# Patient Record
Sex: Female | Born: 1980
Health system: Southern US, Community
[De-identification: ages and names within clinical notes are randomized; demographics above are authoritative.]

## PROBLEM LIST (undated history)

## (undated) DIAGNOSIS — Z531 Procedure and treatment not carried out because of patient's decision for reasons of belief and group pressure: Secondary | ICD-10-CM

## (undated) DIAGNOSIS — G894 Chronic pain syndrome: Secondary | ICD-10-CM

## (undated) DIAGNOSIS — M797 Fibromyalgia: Secondary | ICD-10-CM

## (undated) DIAGNOSIS — C801 Malignant (primary) neoplasm, unspecified: Secondary | ICD-10-CM

## (undated) DIAGNOSIS — D6800 Von Willebrand disease, unspecified: Secondary | ICD-10-CM

## (undated) DIAGNOSIS — C50919 Malignant neoplasm of unspecified site of unspecified female breast: Secondary | ICD-10-CM

## (undated) DIAGNOSIS — C412 Malignant neoplasm of vertebral column: Secondary | ICD-10-CM

## (undated) DIAGNOSIS — M549 Dorsalgia, unspecified: Secondary | ICD-10-CM

## (undated) DIAGNOSIS — F32A Depression, unspecified: Secondary | ICD-10-CM

## (undated) DIAGNOSIS — T8859XA Other complications of anesthesia, initial encounter: Secondary | ICD-10-CM

## (undated) DIAGNOSIS — Z973 Presence of spectacles and contact lenses: Secondary | ICD-10-CM

## (undated) DIAGNOSIS — F329 Major depressive disorder, single episode, unspecified: Secondary | ICD-10-CM

## (undated) DIAGNOSIS — D649 Anemia, unspecified: Secondary | ICD-10-CM

## (undated) DIAGNOSIS — Z853 Personal history of malignant neoplasm of breast: Secondary | ICD-10-CM

## (undated) DIAGNOSIS — N83209 Unspecified ovarian cyst, unspecified side: Secondary | ICD-10-CM

## (undated) DIAGNOSIS — M7918 Myalgia, other site: Secondary | ICD-10-CM

## (undated) DIAGNOSIS — R102 Pelvic and perineal pain: Secondary | ICD-10-CM

## (undated) DIAGNOSIS — IMO0001 Reserved for inherently not codable concepts without codable children: Secondary | ICD-10-CM

## (undated) DIAGNOSIS — Z8489 Family history of other specified conditions: Secondary | ICD-10-CM

## (undated) HISTORY — PX: TUBAL LIGATION: SHX77

## (undated) HISTORY — PX: OVARIAN CYST REMOVAL: SHX89

## (undated) HISTORY — PX: ABDOMINAL HYSTERECTOMY: SHX81

## (undated) HISTORY — DX: Malignant neoplasm of unspecified site of unspecified female breast: C50.919

---

## 2008-06-26 ENCOUNTER — Ambulatory Visit: Payer: Self-pay | Admitting: Family Medicine

## 2008-06-26 DIAGNOSIS — M12249 Villonodular synovitis (pigmented), unspecified hand: Secondary | ICD-10-CM | POA: Insufficient documentation

## 2008-06-26 DIAGNOSIS — F329 Major depressive disorder, single episode, unspecified: Secondary | ICD-10-CM

## 2008-06-26 DIAGNOSIS — J309 Allergic rhinitis, unspecified: Secondary | ICD-10-CM | POA: Insufficient documentation

## 2008-06-26 DIAGNOSIS — F3289 Other specified depressive episodes: Secondary | ICD-10-CM | POA: Insufficient documentation

## 2008-06-26 DIAGNOSIS — M25539 Pain in unspecified wrist: Secondary | ICD-10-CM | POA: Insufficient documentation

## 2008-06-26 DIAGNOSIS — Z853 Personal history of malignant neoplasm of breast: Secondary | ICD-10-CM | POA: Insufficient documentation

## 2008-06-30 ENCOUNTER — Ambulatory Visit: Payer: Self-pay | Admitting: Family Medicine

## 2008-06-30 DIAGNOSIS — K219 Gastro-esophageal reflux disease without esophagitis: Secondary | ICD-10-CM | POA: Insufficient documentation

## 2008-06-30 DIAGNOSIS — R109 Unspecified abdominal pain: Secondary | ICD-10-CM | POA: Insufficient documentation

## 2008-07-03 ENCOUNTER — Ambulatory Visit: Payer: Self-pay | Admitting: Family Medicine

## 2008-07-03 DIAGNOSIS — R5381 Other malaise: Secondary | ICD-10-CM | POA: Insufficient documentation

## 2008-07-03 DIAGNOSIS — R5383 Other fatigue: Secondary | ICD-10-CM

## 2008-07-09 LAB — CONVERTED CEMR LAB
Basophils Absolute: 0 10*3/uL (ref 0.0–0.1)
CO2: 26 meq/L (ref 19–32)
Chloride: 102 meq/L (ref 96–112)
Cholesterol: 177 mg/dL (ref 0–200)
H Pylori IgG: NEGATIVE
LDL Cholesterol: 101 mg/dL — ABNORMAL HIGH (ref 0–99)
Lymphocytes Relative: 37.5 % (ref 12.0–46.0)
MCHC: 33.4 g/dL (ref 30.0–36.0)
Neutrophils Relative %: 51.6 % (ref 43.0–77.0)
Potassium: 3.5 meq/L (ref 3.5–5.1)
RDW: 12.6 % (ref 11.5–14.6)
Sodium: 137 meq/L (ref 135–145)
TSH: 0.36 microintl units/mL (ref 0.35–5.50)
Total CHOL/HDL Ratio: 2.9
Triglycerides: 73 mg/dL (ref 0–149)
VLDL: 15 mg/dL (ref 0–40)

## 2008-07-10 ENCOUNTER — Encounter (INDEPENDENT_AMBULATORY_CARE_PROVIDER_SITE_OTHER): Payer: Self-pay | Admitting: *Deleted

## 2008-10-20 ENCOUNTER — Ambulatory Visit: Payer: Self-pay | Admitting: Family Medicine

## 2008-10-20 DIAGNOSIS — K5289 Other specified noninfective gastroenteritis and colitis: Secondary | ICD-10-CM | POA: Insufficient documentation

## 2009-01-20 ENCOUNTER — Emergency Department (HOSPITAL_COMMUNITY): Admission: EM | Admit: 2009-01-20 | Discharge: 2009-01-21 | Payer: Self-pay | Admitting: Emergency Medicine

## 2009-04-09 ENCOUNTER — Other Ambulatory Visit: Payer: Self-pay | Admitting: Emergency Medicine

## 2009-04-09 ENCOUNTER — Inpatient Hospital Stay (HOSPITAL_COMMUNITY): Admission: AD | Admit: 2009-04-09 | Discharge: 2009-04-09 | Payer: Self-pay | Admitting: Obstetrics & Gynecology

## 2009-10-10 ENCOUNTER — Ambulatory Visit: Payer: Self-pay | Admitting: Family Medicine

## 2009-10-10 DIAGNOSIS — J069 Acute upper respiratory infection, unspecified: Secondary | ICD-10-CM | POA: Insufficient documentation

## 2009-10-10 DIAGNOSIS — G4700 Insomnia, unspecified: Secondary | ICD-10-CM | POA: Insufficient documentation

## 2010-06-11 NOTE — Assessment & Plan Note (Signed)
Summary: 10:15  FEVER,THROWING UP,COUGH/CLE   Vital Signs:  Patient profile:   30 year old female Height:      63 inches Weight:      138.0 pounds BMI:     24.53 Temp:     98.2 degrees F oral Pulse rate:   80 / minute Pulse rhythm:   regular BP sitting:   98 / 60  (left arm) Cuff size:   regular  Vitals Entered By: Benny Lennert CMA Duncan Dull) (October 10, 2009 10:25 AM)  History of Present Illness: Chief complaint cough,runnynose,nausea,diahrrea with blood,chills, and inablity to sleep  Had some severe anemia, severe pain in her abdomen. Had a placenta abrupition.   Hgb got down to 4 while in the hospital.  On bed rest since January.  Jessica Horn -- Dr. Dorothey Baseman at [place].  c/s on Saturday. Blood count came up around the time of delivery.   Started to have some sneezing and was vomitting and having chills.  Nausea, dizziness. Feels like she needs to rest but she cannot rest.   Scared to take some different things.   Had a little diarrhea with some blood.   No fever. Chills.   Allergies (verified): No Known Drug Allergies  Past History:  Past medical, surgical, family and social histories (including risk factors) reviewed, and no changes noted (except as noted below).  Past Medical History: Allergic rhinitis Depression Migraines Breast CA, dx 20, Chemo, 1 rad tx (????) IBS GERD Complex OB, treated at [facility] high risk OB - intractable pain, early dilation, bedrest, placental abruption  NO BLOOD OR BLOOD PRODUCTS  Past Surgical History: c/s, 10/08/2009  Family History: Reviewed history from 06/26/2008 and no changes required. Family History Hypertension (grandparent) Negative for Rheumatological disease  Social History: Reviewed history from 06/30/2008 and no changes required. Occupation: Audiological scientist Witness Single - engaged Never Smoked Alcohol use-yes Drug use-no Regular exercise-no  Review of Systems       as above  Physical  Exam  General:  alert.  pale. mildly ill in appearance. moves slowly. Head:  Normocephalic and atraumatic without obvious abnormalities. No apparent alopecia or balding. Ears:  External ear exam shows no significant lesions or deformities.  Otoscopic examination reveals clear canals, tympanic membranes are intact bilaterally without bulging, retraction, inflammation or discharge. Hearing is grossly normal bilaterally. Nose:  External nasal examination shows no deformity or inflammation. Nasal mucosa are pink and moist without lesions or exudates. Mouth:  Oral mucosa and oropharynx without lesions or exudates.  Teeth in good repair. Neck:  No deformities, masses, or tenderness noted. Lungs:  Normal respiratory effort, chest expands symmetrically. Lungs are clear to auscultation, no crackles or wheezes. Heart:  Normal rate and regular rhythm. S1 and S2 normal without gallop, murmur, click, rub or other extra sounds. Abdomen:  incision c/d/i no redness surrounding Neurologic:  alert & oriented X3.   Cervical Nodes:  No lymphadenopathy noted Psych:  Cognition and judgment appear intact. Alert and cooperative with normal attention span and concentration. No apparent delusions, illusions, hallucinations   Impression & Recommendations:  Problem # 1:  URI (ICD-465.9) Assessment New Complex case given s/p c/s 3 days ago, intractable pain, pain with coughing.  runny nose and cough, c/w URI - but complex in this case d/w Dr. Alphonsus Sias and reviewed all BF meds to ensure OK for newborn  Problem # 2:  COUGH (ICD-786.2) Assessment: New  Problem # 3:  INSOMNIA (ICD-780.52) Assessment: New  Her updated medication list for  this problem includes:    Zolpidem Tartrate 10 Mg Tabs (Zolpidem tartrate) .Marland Kitchen... 1/2 tab by mouth at bedtime as needed insomnia  Complete Medication List: 1)  Zolpidem Tartrate 10 Mg Tabs (Zolpidem tartrate) .... 1/2 tab by mouth at bedtime as needed insomnia  Patient  Instructions: 1)  Plain Mucinex for cough twice a day 2)  Tylenol is OK 3)  Nasal saline spray for nose 4)  Chloraseptic if throat hurts 5)  Plenty of sleep Prescriptions: ZOLPIDEM TARTRATE 10 MG TABS (ZOLPIDEM TARTRATE) 1/2 tab by mouth at bedtime as needed insomnia  #15 x 0   Entered and Authorized by:   Hannah Beat MD   Signed by:   Hannah Beat MD on 10/10/2009   Method used:   Print then Give to Patient   RxID:   [id number]   Prior Medications (reviewed today): None Current Allergies (reviewed today): No known allergies

## 2010-08-14 LAB — DIFFERENTIAL
Basophils Absolute: 0.1 10*3/uL (ref 0.0–0.1)
Eosinophils Relative: 2 % (ref 0–5)
Lymphocytes Relative: 30 % (ref 12–46)
Monocytes Relative: 6 % (ref 3–12)
Neutro Abs: 3.9 10*3/uL (ref 1.7–7.7)

## 2010-08-14 LAB — CBC
Hemoglobin: 11.5 g/dL — ABNORMAL LOW (ref 12.0–15.0)
RBC: 4.08 MIL/uL (ref 3.87–5.11)
RDW: 13.2 % (ref 11.5–15.5)
WBC: 6.5 10*3/uL (ref 4.0–10.5)

## 2010-08-14 LAB — URINALYSIS, ROUTINE W REFLEX MICROSCOPIC
Glucose, UA: NEGATIVE mg/dL
Protein, ur: NEGATIVE mg/dL
Urobilinogen, UA: 0.2 mg/dL (ref 0.0–1.0)

## 2010-08-14 LAB — GC/CHLAMYDIA PROBE AMP, GENITAL: Chlamydia, DNA Probe: NEGATIVE

## 2010-08-14 LAB — URINE MICROSCOPIC-ADD ON

## 2010-08-14 LAB — WET PREP, GENITAL

## 2010-10-28 ENCOUNTER — Telehealth: Payer: Self-pay | Admitting: *Deleted

## 2010-10-28 NOTE — Telephone Encounter (Signed)
Benadryl and cortisone cream are very reasonable first steps  F/u if worsening significantly

## 2010-10-28 NOTE — Telephone Encounter (Signed)
Noted  

## 2010-10-28 NOTE — Telephone Encounter (Signed)
[place] and she says that now she is broken out in hives on her hands, butt, legs and her feet are swollen. She says that hive are really itchy, but are painful as well. She is taking tylenol and has used cortisone cream. She is asking if it is okay for her to take benadryl or what other recommendations you would have. Please advise.

## 2010-10-28 NOTE — Telephone Encounter (Signed)
Patients mother advised unable to call long distance

## 2010-11-10 ENCOUNTER — Emergency Department (HOSPITAL_COMMUNITY)
Admission: EM | Admit: 2010-11-10 | Discharge: 2010-11-10 | Disposition: A | Discharge status: Home / Self Care | Payer: Medicaid Other | Attending: Emergency Medicine | Admitting: Emergency Medicine

## 2010-11-10 DIAGNOSIS — N949 Unspecified condition associated with female genital organs and menstrual cycle: Secondary | ICD-10-CM | POA: Insufficient documentation

## 2010-11-10 DIAGNOSIS — N898 Other specified noninflammatory disorders of vagina: Secondary | ICD-10-CM | POA: Insufficient documentation

## 2010-11-10 LAB — URINE MICROSCOPIC-ADD ON

## 2010-11-10 LAB — WET PREP, GENITAL

## 2010-11-10 LAB — POCT I-STAT, CHEM 8
Creatinine, Ser: 0.6 mg/dL (ref 0.50–1.10)
Glucose, Bld: 84 mg/dL (ref 70–99)
Hemoglobin: 13.6 g/dL (ref 12.0–15.0)
TCO2: 19 mmol/L (ref 0–100)

## 2010-11-10 LAB — URINALYSIS, ROUTINE W REFLEX MICROSCOPIC
Leukocytes, UA: NEGATIVE
Nitrite: NEGATIVE
Specific Gravity, Urine: 1.022 (ref 1.005–1.030)
Urobilinogen, UA: 0.2 mg/dL (ref 0.0–1.0)

## 2010-11-10 LAB — CBC
HCT: 36.5 % (ref 36.0–46.0)
MCH: 27.4 pg (ref 26.0–34.0)
MCHC: 33.7 g/dL (ref 30.0–36.0)
RDW: 13.7 % (ref 11.5–15.5)

## 2010-11-10 LAB — POCT PREGNANCY, URINE: Preg Test, Ur: NEGATIVE

## 2010-11-10 LAB — DIFFERENTIAL
Basophils Absolute: 0 10*3/uL (ref 0.0–0.1)
Basophils Relative: 0 % (ref 0–1)
Eosinophils Relative: 2 % (ref 0–5)
Monocytes Absolute: 0.6 10*3/uL (ref 0.1–1.0)

## 2010-11-10 LAB — SAMPLE TO BLOOD BANK

## 2010-11-11 LAB — GC/CHLAMYDIA PROBE AMP, GENITAL: Chlamydia, DNA Probe: NEGATIVE

## 2012-01-11 ENCOUNTER — Encounter (HOSPITAL_COMMUNITY): Payer: Self-pay | Admitting: Emergency Medicine

## 2012-01-11 ENCOUNTER — Emergency Department (HOSPITAL_COMMUNITY)
Admission: EM | Admit: 2012-01-11 | Discharge: 2012-01-12 | Disposition: A | Discharge status: Home / Self Care | Payer: Self-pay | Attending: Emergency Medicine | Admitting: Emergency Medicine

## 2012-01-11 DIAGNOSIS — G8929 Other chronic pain: Secondary | ICD-10-CM | POA: Insufficient documentation

## 2012-01-11 DIAGNOSIS — Z853 Personal history of malignant neoplasm of breast: Secondary | ICD-10-CM | POA: Insufficient documentation

## 2012-01-11 DIAGNOSIS — M549 Dorsalgia, unspecified: Secondary | ICD-10-CM | POA: Insufficient documentation

## 2012-01-11 DIAGNOSIS — IMO0001 Reserved for inherently not codable concepts without codable children: Secondary | ICD-10-CM | POA: Insufficient documentation

## 2012-01-11 HISTORY — DX: Dorsalgia, unspecified: M54.9

## 2012-01-11 HISTORY — DX: Malignant (primary) neoplasm, unspecified: C80.1

## 2012-01-11 HISTORY — DX: Fibromyalgia: M79.7

## 2012-01-11 LAB — CBC WITH DIFFERENTIAL/PLATELET
Basophils Absolute: 0 10*3/uL (ref 0.0–0.1)
Basophils Relative: 0 % (ref 0–1)
Eosinophils Absolute: 0.1 10*3/uL (ref 0.0–0.7)
Hemoglobin: 12.8 g/dL (ref 12.0–15.0)
MCH: 27.6 pg (ref 26.0–34.0)
MCHC: 33.6 g/dL (ref 30.0–36.0)
Neutro Abs: 5.2 10*3/uL (ref 1.7–7.7)
Neutrophils Relative %: 66 % (ref 43–77)
Platelets: 304 10*3/uL (ref 150–400)
RDW: 11.9 % (ref 11.5–15.5)

## 2012-01-11 LAB — BASIC METABOLIC PANEL
Chloride: 103 mEq/L (ref 96–112)
GFR calc Af Amer: >90 mL/min (ref >90)
GFR calc non Af Amer: >90 mL/min (ref >90)
Potassium: 3.8 mEq/L (ref 3.5–5.1)
Sodium: 138 mEq/L (ref 135–145)

## 2012-01-11 LAB — POCT PREGNANCY, URINE: Preg Test, Ur: NEGATIVE

## 2012-01-11 LAB — URINALYSIS, ROUTINE W REFLEX MICROSCOPIC
Glucose, UA: NEGATIVE mg/dL
Leukocytes, UA: NEGATIVE
Nitrite: NEGATIVE
Protein, ur: NEGATIVE mg/dL
Urobilinogen, UA: 0.2 mg/dL (ref 0.0–1.0)

## 2012-01-11 MED ORDER — OXYCODONE-ACETAMINOPHEN 5-325 MG PO TABS
2.0000 | ORAL_TABLET | Freq: Once | ORAL | Status: AC
  Administered 2012-01-11: 2 via ORAL
  Filled 2012-01-11: qty 2

## 2012-01-11 MED ORDER — KETOROLAC TROMETHAMINE 60 MG/2ML IM SOLN
60.0000 mg | Freq: Once | INTRAMUSCULAR | Status: AC
  Administered 2012-01-11: 60 mg via INTRAMUSCULAR
  Filled 2012-01-11: qty 2

## 2012-01-11 NOTE — ED Notes (Signed)
C/o generalized back pain x 5 years and LLQ pain x 1 year.  Denies nausea, vomiting, and urinary complaints.

## 2012-01-11 NOTE — ED Notes (Addendum)
Pt states that she lost insurance and cannot see anyone now for pain medication. States "My pain level is through the roof, that's why I'm here, I usually take a lot and can't get any." Pt states that she has hx of multiple cysts, but MD thought pt could have fibromyalgia.

## 2012-01-11 NOTE — ED Provider Notes (Signed)
History     CSN: [id number]  Arrival date & time 01/11/12  1943   First MD Initiated Contact with Patient 01/11/12 2232      Chief Complaint  Patient presents with  . Back Pain   HPI  History provided by the patient. Patient is a 31 year old female with prior history of fibromyalgia, back pain, IBS who presents with multiple complaints of chronic symptoms. Patient complains of diffuse back pain as well as some left lower quadrant abdominal pains. Symptoms have been worsening over the past 6 months have been present off and on for the past 5 years. Patient states she lost her Medicaid 6 months ago and has not been able to take her normal medications of oxycodone and muscle relaxers or medications for her IBS. Patient states that she never had any explanation for why she had such chronic pains in her back she states she was unable to get any workup or testing done for her insurance was lost. Patient also reports having a 60 pound weight loss over the past 2 months. She denies any other complaints at this time. Pain is worse with movements and walking. She denies any fever, chills or sweats. She denies any nausea, vomiting, diarrhea or constipation.    Past Medical History  Diagnosis Date  . Fibromyalgia   . Back pain   . Cancer     History reviewed. No pertinent past surgical history.  No family history on file.  History  Substance Use Topics  . Smoking status: Never Smoker   . Smokeless tobacco: Not on file  . Alcohol Use: Yes    OB History    Grav Para Term Preterm Abortions TAB SAB Ect Mult Living                  Review of Systems  Constitutional: Positive for unexpected weight change. Negative for fever, chills, diaphoresis and appetite change.  HENT: Negative for neck pain and neck stiffness.   Respiratory: Negative for cough and shortness of breath.   Cardiovascular: Negative for chest pain.  Gastrointestinal: Positive for abdominal pain. Negative for nausea,  vomiting, diarrhea and constipation.  Genitourinary: Negative for dysuria, frequency, hematuria, flank pain, vaginal bleeding and vaginal discharge.  Musculoskeletal: Positive for back pain. Negative for myalgias and arthralgias.  Skin: Negative for rash.  Neurological: Negative for headaches.    Allergies  Review of patient's allergies indicates no known allergies.  Home Medications  No current outpatient prescriptions on file.  BP 128/104  Pulse 98  Temp 99 F (37.2 C) (Oral)  Resp 16  SpO2 100%  LMP 12/13/2011  Physical Exam  Nursing note and vitals reviewed. Constitutional: She is oriented to person, place, and time. She appears well-developed and well-nourished. No distress.  HENT:  Head: Normocephalic.  Cardiovascular: Normal rate and regular rhythm.   Pulmonary/Chest: Effort normal and breath sounds normal.  Abdominal: Soft.  Musculoskeletal:       Thoracic back: She exhibits tenderness and bony tenderness.       Lumbar back: She exhibits tenderness and bony tenderness.       Back:       Patient complains of pain and jumps to any light palpation along the thoracic and lumbar spine. There is no gross deformities of the spinous processes. No swelling. No significant scoliosis. Patient does move normally with bending up and down in bed and sitting.  Neurological: She is alert and oriented to person, place, and time. She has normal strength.  No sensory deficit.       Normal strength and sensations bilaterally in extremities.  Skin: Skin is warm and dry. No rash noted.  Psychiatric: She has a normal mood and affect. Her behavior is normal.    ED Course  Procedures  Results for orders placed during the hospital encounter of 01/11/12  URINALYSIS, ROUTINE W REFLEX MICROSCOPIC      Component Value Range   Color, Urine YELLOW  YELLOW   APPearance CLEAR  CLEAR   Specific Gravity, Urine 1.008  1.005 - 1.030   pH 6.5  5.0 - 8.0   Glucose, UA NEGATIVE  NEGATIVE mg/dL    Hgb urine dipstick NEGATIVE  NEGATIVE   Bilirubin Urine NEGATIVE  NEGATIVE   Ketones, ur NEGATIVE  NEGATIVE mg/dL   Protein, ur NEGATIVE  NEGATIVE mg/dL   Urobilinogen, UA 0.2  0.0 - 1.0 mg/dL   Nitrite NEGATIVE  NEGATIVE   Leukocytes, UA NEGATIVE  NEGATIVE  SEDIMENTATION RATE      Component Value Range   Sed Rate 2  0 - 22 mm/hr  CBC WITH DIFFERENTIAL      Component Value Range   WBC 8.0  4.0 - 10.5 K/uL   RBC 4.63  3.87 - 5.11 MIL/uL   Hemoglobin 12.8  12.0 - 15.0 g/dL   HCT 95.6  21.3 - 08.6 %   MCV 82.3  78.0 - 100.0 fL   MCH 27.6  26.0 - 34.0 pg   MCHC 33.6  30.0 - 36.0 g/dL   RDW 57.8  46.9 - 62.9 %   Platelets 304  150 - 400 K/uL   Neutrophils Relative 66  43 - 77 %   Neutro Abs 5.2  1.7 - 7.7 K/uL   Lymphocytes Relative 29  12 - 46 %   Lymphs Abs 2.3  0.7 - 4.0 K/uL   Monocytes Relative 4  3 - 12 %   Monocytes Absolute 0.4  0.1 - 1.0 K/uL   Eosinophils Relative 1  0 - 5 %   Eosinophils Absolute 0.1  0.0 - 0.7 K/uL   Basophils Relative 0  0 - 1 %   Basophils Absolute 0.0  0.0 - 0.1 K/uL  BASIC METABOLIC PANEL      Component Value Range   Sodium 138  135 - 145 mEq/L   Potassium 3.8  3.5 - 5.1 mEq/L   Chloride 103  96 - 112 mEq/L   CO2 24  19 - 32 mEq/L   Glucose, Bld 99  70 - 99 mg/dL   BUN 9  6 - 23 mg/dL   Creatinine, Ser 5.28  0.50 - 1.10 mg/dL   Calcium 9.8  8.4 - 41.3 mg/dL   GFR calc non Af Amer >90  >90 mL/min   GFR calc Af Amer >90  >90 mL/min  POCT PREGNANCY, URINE      Component Value Range   Preg Test, Ur NEGATIVE  NEGATIVE      Dg Thoracic Spine 2 View  01/12/2012  *RADIOLOGY REPORT*  Clinical Data: Back pain, history breast cancer  THORACIC SPINE - 2 VIEW  Comparison: None  Findings: Osseous demineralization. 12 pairs of ribs. Mild broad-based dextroconvex thoracic scoliosis apex T8. Vertebral body and disc space heights maintained. No acute fracture, subluxation or bone destruction. Visualized portions of the posterior ribs appear intact.   IMPRESSION: Mild dextroconvex scoliosis. No acute abnormalities.   Original Report Authenticated By: Lollie Marrow, M.D.    Dg Lumbar Spine  Complete  01/12/2012  *RADIOLOGY REPORT*  Clinical Data: Back pain for 2 years worsening in left 6 months  LUMBAR SPINE - COMPLETE 4+ VIEW  Comparison: None  Findings: Five non-rib bearing lumbar vertebrae. Vertebral body disc space heights maintained. No acute fracture, subluxation or bone destruction. No spondylolysis. SI joints symmetric. Surgical clips in pelvis likely prior tubal ligation.  IMPRESSION: No acute osseous abnormalities.   Original Report Authenticated By: Lollie Marrow, M.D.      1. Chronic pain       MDM  10:50PM patient seen and evaluated. This does not appear in any significant discomfort or acute distress.    Patient has had prescriptions filled for oxycodone and hydrocodone several times in May of this year as well as a prescription in July under the last name Zollie Beckers at different locations in the stay including [place], [place]. Prior to this she did have regular narcotic prescriptions by Dr. Marita [place] under the last name Nyquist up until March of this year.   Angus Seller, [place] 01/12/12 [phone]

## 2012-01-12 ENCOUNTER — Emergency Department (HOSPITAL_COMMUNITY): Payer: Self-pay

## 2012-01-12 LAB — SEDIMENTATION RATE: Sed Rate: 2 mm/hr (ref 0–22)

## 2012-01-12 MED ORDER — CYCLOBENZAPRINE HCL 10 MG PO TABS: 10.0000 mg | ORAL_TABLET | Freq: Three times a day (TID) | ORAL | Status: AC | PRN

## 2012-01-12 MED ORDER — MELOXICAM 7.5 MG PO TABS: 7.5000 mg | ORAL_TABLET | Freq: Every day | ORAL | Status: DC

## 2012-01-12 NOTE — ED Notes (Signed)
Patient transported to X-ray 

## 2012-01-13 NOTE — ED Provider Notes (Signed)
Medical screening examination/treatment/procedure(s) were performed by non-physician practitioner and as supervising physician I was immediately available for consultation/collaboration.   Yazlynn Birkeland W Leotha Voeltz, MD 01/13/12 0004 

## 2012-04-19 ENCOUNTER — Encounter (HOSPITAL_COMMUNITY): Payer: Self-pay | Admitting: *Deleted

## 2012-04-19 ENCOUNTER — Emergency Department (HOSPITAL_COMMUNITY): Payer: Self-pay

## 2012-04-19 ENCOUNTER — Emergency Department (HOSPITAL_COMMUNITY)
Admission: EM | Admit: 2012-04-19 | Discharge: 2012-04-19 | Disposition: A | Discharge status: Home / Self Care | Payer: Self-pay | Attending: Emergency Medicine | Admitting: Emergency Medicine

## 2012-04-19 DIAGNOSIS — Z853 Personal history of malignant neoplasm of breast: Secondary | ICD-10-CM | POA: Insufficient documentation

## 2012-04-19 DIAGNOSIS — IMO0001 Reserved for inherently not codable concepts without codable children: Secondary | ICD-10-CM | POA: Insufficient documentation

## 2012-04-19 DIAGNOSIS — J111 Influenza due to unidentified influenza virus with other respiratory manifestations: Secondary | ICD-10-CM | POA: Insufficient documentation

## 2012-04-19 DIAGNOSIS — B349 Viral infection, unspecified: Secondary | ICD-10-CM

## 2012-04-19 DIAGNOSIS — B9789 Other viral agents as the cause of diseases classified elsewhere: Secondary | ICD-10-CM | POA: Insufficient documentation

## 2012-04-19 LAB — RAPID STREP SCREEN (MED CTR MEBANE ONLY): Streptococcus, Group A Screen (Direct): NEGATIVE

## 2012-04-19 MED ORDER — LIDOCAINE VISCOUS 2 % MT SOLN
20.0000 mL | Freq: Once | OROMUCOSAL | Status: AC
  Administered 2012-04-19: 20 mL via OROMUCOSAL
  Filled 2012-04-19: qty 15

## 2012-04-19 MED ORDER — ACETAMINOPHEN 500 MG PO CAPS: ORAL_CAPSULE | ORAL | Status: DC

## 2012-04-19 MED ORDER — LIDOCAINE VISCOUS 2 % MT SOLN: 20.0000 mL | OROMUCOSAL | Status: DC | PRN

## 2012-04-19 MED ORDER — DEXAMETHASONE SODIUM PHOSPHATE 10 MG/ML IJ SOLN: 10.0000 mg | Freq: Once | INTRAMUSCULAR | Status: DC

## 2012-04-19 MED ORDER — DEXAMETHASONE SODIUM PHOSPHATE 10 MG/ML IJ SOLN
10.0000 mg | Freq: Once | INTRAMUSCULAR | Status: AC
  Administered 2012-04-19: 10 mg via INTRAMUSCULAR
  Filled 2012-04-19: qty 1

## 2012-04-19 MED ORDER — IBUPROFEN 600 MG PO TABS: 600.0000 mg | ORAL_TABLET | Freq: Four times a day (QID) | ORAL | Status: DC | PRN

## 2012-04-19 NOTE — ED Notes (Signed)
Pt reports cough, lethargy, sore throat x 1 day.  pts daughter was diagnosed with PNA yesterday.

## 2012-04-19 NOTE — ED Provider Notes (Addendum)
History   This chart was scribed for Derwood Kaplan, MD, by Frederik Pear, ER scribe. The patient was seen in room TR07C/TR07C and the patient's care was started at 1819.    CSN: [id number]  Arrival date & time 04/19/12  1801   First MD Initiated Contact with Patient 04/19/12 1819      Chief Complaint  Patient presents with  . Sore Throat    (Consider location/radiation/quality/duration/timing/severity/associated sxs/prior treatment) HPI Comments: Jessica Horn is a 31 y.o. female who presents to the Emergency Department complaining of a constant, moderate sore throat with associated congestions, fever, generalized body aches, and a dry cough that began 3 days ago. She states that the pain from the sore throat makes it hard to swallow. She states that she has used Mucinex at home with no relief. She has no h/o of diabetes or other existing medical conditions. She reports that her daughter was diagnosed with PNA yesterday.      Past Medical History  Diagnosis Date  . Fibromyalgia   . Back pain   . Cancer     breast CA at 31 y/o    History reviewed. No pertinent past surgical history.  History reviewed. No pertinent family history.  History  Substance Use Topics  . Smoking status: Never Smoker   . Smokeless tobacco: Not on file  . Alcohol Use: Yes    OB History    Grav Para Term Preterm Abortions TAB SAB Ect Mult Living                  Review of Systems  A complete 10 system review of systems was obtained and all systems are negative except as noted in the HPI and PMH.   Allergies  Review of patient's allergies indicates no known allergies.  Home Medications   Current Outpatient Rx  Name  Route  Sig  Dispense  Refill  . MELOXICAM 7.5 MG PO TABS   Oral   Take 1 tablet (7.5 mg total) by mouth daily.   15 tablet   0     BP 111/86  Pulse 121  Temp 99.2 F (37.3 C) (Oral)  Resp 97  SpO2 97%  Physical Exam  Nursing note and vitals  reviewed. HENT:       Her bilateral nares are clear. She has no erythema. She has mild posterior pharyngeal erythema. She has no tonsillar exudates or enlargement.  Cardiovascular: Regular rhythm.  Tachycardia present.   Pulmonary/Chest: Effort normal and breath sounds normal.       Her lungs are clear to occultation bilaterally.   Abdominal: Soft. Bowel sounds are normal.  Lymphadenopathy:    She has cervical adenopathy.    ED Course  Procedures (including critical care time)  DIAGNOSTIC STUDIES: Oxygen Saturation is 97% on room air, adequate by my interpretation.    COORDINATION OF CARE:  18:30- Discussed planned course of treatment with the patient, including a dose of steroids and a chest X-ray, who is agreeable at this time.     Labs Reviewed  RAPID STREP SCREEN   No results found.   No diagnosis found.    MDM  I personally performed the services described in this documentation, which was scribed in my presence. The recorded information has been reviewed and is accurate.  Pt comes in with sore throat, sinus congestion, cough and myalgias with fever.  DDX includes: Viral syndrome Influenza Pharyngitis Sinusitis Mononucleosis  Initial impression is viral syndrome - or true influenza. Triage  ordered rapid strep - but i dont think she has strep. Will give decadron for the pharyngitis. Patient will get CXR to r/o pneumonia as that have a family member with PNA. Patient doesn't want to the rx for tamiflu when given the option after we discussed the tamiflu benefits.  No concerns for deep infection of the throat based on hx and exam showing no drooling, muffled sounds, and difficulty with breathing or controlling secretions.     Derwood Kaplan, MD 04/19/12 1857  Derwood Kaplan, MD 04/19/12 1901

## 2012-04-30 ENCOUNTER — Encounter: Payer: Self-pay | Admitting: Family Medicine

## 2012-05-26 ENCOUNTER — Ambulatory Visit: Payer: Self-pay | Admitting: Family Medicine

## 2012-06-07 ENCOUNTER — Emergency Department (HOSPITAL_COMMUNITY)
Admission: EM | Admit: 2012-06-07 | Discharge: 2012-06-08 | Disposition: A | Discharge status: Home / Self Care | Payer: BC Managed Care – PPO | Attending: Emergency Medicine | Admitting: Emergency Medicine

## 2012-06-07 ENCOUNTER — Emergency Department (HOSPITAL_COMMUNITY): Payer: BC Managed Care – PPO

## 2012-06-07 ENCOUNTER — Encounter (HOSPITAL_COMMUNITY): Payer: Self-pay | Admitting: Adult Health

## 2012-06-07 DIAGNOSIS — IMO0002 Reserved for concepts with insufficient information to code with codable children: Secondary | ICD-10-CM | POA: Insufficient documentation

## 2012-06-07 DIAGNOSIS — Y9289 Other specified places as the place of occurrence of the external cause: Secondary | ICD-10-CM | POA: Insufficient documentation

## 2012-06-07 DIAGNOSIS — S060XAA Concussion with loss of consciousness status unknown, initial encounter: Secondary | ICD-10-CM

## 2012-06-07 DIAGNOSIS — Y939 Activity, unspecified: Secondary | ICD-10-CM | POA: Insufficient documentation

## 2012-06-07 DIAGNOSIS — Z853 Personal history of malignant neoplasm of breast: Secondary | ICD-10-CM | POA: Insufficient documentation

## 2012-06-07 DIAGNOSIS — Z23 Encounter for immunization: Secondary | ICD-10-CM | POA: Insufficient documentation

## 2012-06-07 DIAGNOSIS — S060X9A Concussion with loss of consciousness of unspecified duration, initial encounter: Secondary | ICD-10-CM | POA: Insufficient documentation

## 2012-06-07 DIAGNOSIS — W1809XA Striking against other object with subsequent fall, initial encounter: Secondary | ICD-10-CM | POA: Insufficient documentation

## 2012-06-07 DIAGNOSIS — R4789 Other speech disturbances: Secondary | ICD-10-CM | POA: Insufficient documentation

## 2012-06-07 DIAGNOSIS — R55 Syncope and collapse: Secondary | ICD-10-CM | POA: Insufficient documentation

## 2012-06-07 DIAGNOSIS — S80211A Abrasion, right knee, initial encounter: Secondary | ICD-10-CM

## 2012-06-07 DIAGNOSIS — R42 Dizziness and giddiness: Secondary | ICD-10-CM | POA: Insufficient documentation

## 2012-06-07 DIAGNOSIS — W010XXA Fall on same level from slipping, tripping and stumbling without subsequent striking against object, initial encounter: Secondary | ICD-10-CM | POA: Insufficient documentation

## 2012-06-07 DIAGNOSIS — Z8739 Personal history of other diseases of the musculoskeletal system and connective tissue: Secondary | ICD-10-CM | POA: Insufficient documentation

## 2012-06-07 MED ORDER — ONDANSETRON 4 MG PO TBDP
8.0000 mg | ORAL_TABLET | Freq: Once | ORAL | Status: AC
  Administered 2012-06-07: 8 mg via ORAL
  Filled 2012-06-07 (×2): qty 2

## 2012-06-07 MED ORDER — DICLOFENAC SODIUM 1 % TD GEL: 2.0000 g | Freq: Four times a day (QID) | TRANSDERMAL | Status: DC

## 2012-06-07 MED ORDER — OXYCODONE-ACETAMINOPHEN 5-325 MG PO TABS: 2.0000 | ORAL_TABLET | ORAL | Status: DC | PRN

## 2012-06-07 MED ORDER — ONDANSETRON HCL 4 MG PO TABS: 4.0000 mg | ORAL_TABLET | Freq: Four times a day (QID) | ORAL | Status: DC | PRN

## 2012-06-07 MED ORDER — TRAMADOL HCL 50 MG PO TABS
50.0000 mg | ORAL_TABLET | Freq: Once | ORAL | Status: AC
  Administered 2012-06-07: 50 mg via ORAL
  Filled 2012-06-07: qty 1

## 2012-06-07 MED ORDER — OXYCODONE-ACETAMINOPHEN 5-325 MG PO TABS
1.0000 | ORAL_TABLET | Freq: Once | ORAL | Status: AC
  Administered 2012-06-07: 1 via ORAL
  Filled 2012-06-07: qty 1

## 2012-06-07 MED ORDER — TETANUS-DIPHTH-ACELL PERTUSSIS 5-2.5-18.5 LF-MCG/0.5 IM SUSP
0.5000 mL | Freq: Once | INTRAMUSCULAR | Status: AC
  Administered 2012-06-07: 0.5 mL via INTRAMUSCULAR
  Filled 2012-06-07: qty 0.5

## 2012-06-07 NOTE — ED Provider Notes (Signed)
History  This chart was scribed for Jessica Booze, MD by Marlin Canary ED Scribe. The patient was seen in room TR07C/TR07C. Patient's care was started at 2137.  CSN: [id number]  Arrival date & time 06/07/12  2124   First MD Initiated Contact with Patient 06/07/12 2137      Chief Complaint  Patient presents with  . Fall    The history is provided by the patient. No language interpreter was used.   Jessica Horn is a 32 y.o. female who presents to the Emergency Department complaining of a fall that occurred earlier today while at work. She states that she hit her hit and skinned her right knee upon falling. She rates the pain an 8/10.  Pt reports syncope, talking slower, dizziness and light-headedness since hitting her head. She states that she has been more forgetful. She is unsure of last Tetanus. No current PCP.    Past Medical History  Diagnosis Date  . Fibromyalgia   . Back pain   . Cancer     breast CA at 32 y/o    History reviewed. No pertinent past surgical history.  History reviewed. No pertinent family history.  History  Substance Use Topics  . Smoking status: Never Smoker   . Smokeless tobacco: Not on file  . Alcohol Use: Yes    OB History    Grav Para Term Preterm Abortions TAB SAB Ect Mult Living                  Review of Systems  Skin: Positive for wound.  Neurological: Positive for dizziness, syncope, speech difficulty and light-headedness.  All other systems reviewed and are negative.    Allergies  Review of patient's allergies indicates no known allergies.  Home Medications   Current Outpatient Rx  Name  Route  Sig  Dispense  Refill  . ACETAMINOPHEN 500 MG PO TABS   Oral   Take 1,000 mg by mouth every 6 (six) hours as needed. For pain           BP 118/79  Pulse 97  Temp 97.2 F (36.2 C) (Oral)  Resp 16  SpO2 100%  Physical Exam  Nursing note and vitals reviewed. Constitutional: She is oriented to person, place,  and time. She appears well-developed and well-nourished. No distress.  HENT:  Head: Normocephalic and atraumatic.  Eyes: Conjunctivae normal and EOM are normal.  Fundoscopic exam:      The right eye shows no hemorrhage and no papilledema.       The left eye shows no hemorrhage and no papilledema.       Fundoscopic exam shows no hemorrhage or papilledema.   Neck: Neck supple. No tracheal deviation present.  Cardiovascular: Normal rate, regular rhythm and normal heart sounds.   Pulmonary/Chest: Effort normal and breath sounds normal. No respiratory distress.  Musculoskeletal: Normal range of motion.       Right shoulder: She exhibits no deformity.       Abrasion over anterior aspect of the right knee. No deformity, swelling or instability noted.   Neurological: She is alert and oriented to person, place, and time.  Skin: Skin is warm and dry.  Psychiatric: She has a normal mood and affect. Her behavior is normal.    ED Course  Procedures (including critical care time)  DIAGNOSTIC STUDIES: Oxygen Saturation is 100% on room air, normal by my interpretation.    COORDINATION OF CARE:  21:50-Discussed planned course of treatment with the patient including  updating her Tetanus, CT of head, x-ray of right knee and Tramadol, who is agreeable at this time.    Ct Head Wo Contrast  06/07/2012  *RADIOLOGY REPORT*  Clinical Data: Fall, dizziness  CT HEAD WITHOUT CONTRAST  Technique:  Contiguous axial images were obtained from the base of the skull through the vertex without contrast.  Comparison: None.  Findings: There is no evidence for acute hemorrhage, hydrocephalus, mass lesion, or abnormal extra-axial fluid collection.  No definite CT evidence for acute infarction.  The visualized paranasal sinuses and mastoid air cells are predominately clear.  No displaced calvarial fracture.  IMPRESSION: No acute intracranial abnormality.   Original Report Authenticated By: Jearld Lesch, M.D.    Dg Knee  Complete 4 Views Right  06/07/2012  *RADIOLOGY REPORT*  Clinical Data: 32 year old female with right knee pain following injury.  RIGHT KNEE - COMPLETE 4+ VIEW  Comparison: None  Findings: No evidence of acute fracture, subluxation or dislocation identified.  No joint effusion noted.  No radio-opaque foreign bodies are present.  No focal bony lesions are noted.  The joint spaces are unremarkable.  IMPRESSION: Unremarkable right knee.   Original Report Authenticated By: Harmon Pier, M.D.    Images viewed by me.  1. Cerebral concussion   2. Abrasion of knee, right       MDM  Fall with a closed head injury. Abrasion of the right knee. She has symptoms suggesting a concussion, so CT will be obtained.  CT is unremarkable as is x-ray of the right knee. She had been given a dose of tramadol which did not give her sufficient relief. She then developed nausea and vomiting. She's given a dose of ondansetron and a dose of Percocet. She'll be sent home with prescriptions for ondansetron, Percocet, and diclofenac gel.   I personally performed the services described in this documentation, which was scribed in my presence. The recorded information has been reviewed and is accurate.         Jessica Booze, MD 06/07/12 2350

## 2012-06-07 NOTE — ED Notes (Signed)
Pt fell today and hit right side of head as well as right knee, abrasion noted to right knee with bleeding controlled-no obvious deformity noted.  Pt states she felt fine and has episodes of "forgetfulness and blacking out" since injury, difficulty keeping her thoughts together.  Pt has vomited X 1 since fall.  Pt undressed and placed into gown.

## 2012-06-07 NOTE — ED Notes (Addendum)
Pt reports falling at noon, tripping and hitting right knee and right side of head. At this time she is able to recall all events of accident, answers all questions appropriately. She is alert, oriented and PERLLA,. She is concerned she has a concussion. Hit head on back porch concrete, no bleeding noted.

## 2012-06-11 ENCOUNTER — Encounter: Payer: Self-pay | Admitting: Family Medicine

## 2012-06-11 ENCOUNTER — Ambulatory Visit (INDEPENDENT_AMBULATORY_CARE_PROVIDER_SITE_OTHER): Payer: BC Managed Care – PPO | Admitting: Family Medicine

## 2012-06-11 VITALS — BP 110/80 | HR 99 | Temp 98.9°F | Ht 63.25 in | Wt 148.4 lb

## 2012-06-11 DIAGNOSIS — R635 Abnormal weight gain: Secondary | ICD-10-CM | POA: Insufficient documentation

## 2012-06-11 DIAGNOSIS — R279 Unspecified lack of coordination: Secondary | ICD-10-CM

## 2012-06-11 DIAGNOSIS — R52 Pain, unspecified: Secondary | ICD-10-CM

## 2012-06-11 DIAGNOSIS — R278 Other lack of coordination: Secondary | ICD-10-CM | POA: Insufficient documentation

## 2012-06-11 DIAGNOSIS — F329 Major depressive disorder, single episode, unspecified: Secondary | ICD-10-CM

## 2012-06-11 DIAGNOSIS — F3289 Other specified depressive episodes: Secondary | ICD-10-CM

## 2012-06-11 DIAGNOSIS — G4700 Insomnia, unspecified: Secondary | ICD-10-CM | POA: Insufficient documentation

## 2012-06-11 DIAGNOSIS — G8929 Other chronic pain: Secondary | ICD-10-CM

## 2012-06-11 LAB — CBC WITH DIFFERENTIAL/PLATELET
Basophils Relative: 0 % (ref 0–1)
Eosinophils Relative: 2 % (ref 0–5)
HCT: 37 % (ref 36.0–46.0)
Hemoglobin: 12.2 g/dL (ref 12.0–15.0)
Lymphocytes Relative: 31 % (ref 12–46)
MCHC: 33 g/dL (ref 30.0–36.0)
MCV: 83.1 fL (ref 78.0–100.0)
Monocytes Absolute: 0.5 10*3/uL (ref 0.1–1.0)
Monocytes Relative: 9 % (ref 3–12)
Neutro Abs: 3.2 10*3/uL (ref 1.7–7.7)

## 2012-06-11 LAB — BASIC METABOLIC PANEL
CO2: 26 mEq/L (ref 19–32)
Calcium: 9.3 mg/dL (ref 8.4–10.5)
Potassium: 4.2 mEq/L (ref 3.5–5.3)
Sodium: 137 mEq/L (ref 135–145)

## 2012-06-11 LAB — HEPATIC FUNCTION PANEL
ALT: <8 U/L (ref 0–35)
AST: 14 U/L (ref 0–37)
Bilirubin, Direct: 0.2 mg/dL (ref 0.0–0.3)
Indirect Bilirubin: 0.4 mg/dL (ref 0.0–0.9)

## 2012-06-11 MED ORDER — PREGABALIN 75 MG PO CAPS: 75.0000 mg | ORAL_CAPSULE | Freq: Two times a day (BID) | ORAL | Status: DC

## 2012-06-11 NOTE — Patient Instructions (Addendum)
We'll notify you of your lab results and make any changes if needed Someone will call you with your neurology referral Start the Lyrica twice daily for pain Call and schedule a psychiatry appt to help w/ the depression and anxiety Call with any questions or concerns Hang in there!
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
## 2012-06-12 ENCOUNTER — Emergency Department (HOSPITAL_COMMUNITY): Payer: BC Managed Care – PPO

## 2012-06-12 ENCOUNTER — Emergency Department (HOSPITAL_COMMUNITY)
Admission: EM | Admit: 2012-06-12 | Discharge: 2012-06-12 | Disposition: A | Discharge status: Home / Self Care | Payer: BC Managed Care – PPO | Attending: Emergency Medicine | Admitting: Emergency Medicine

## 2012-06-12 ENCOUNTER — Encounter (HOSPITAL_COMMUNITY): Payer: Self-pay | Admitting: Emergency Medicine

## 2012-06-12 DIAGNOSIS — Y929 Unspecified place or not applicable: Secondary | ICD-10-CM | POA: Insufficient documentation

## 2012-06-12 DIAGNOSIS — Z79899 Other long term (current) drug therapy: Secondary | ICD-10-CM | POA: Insufficient documentation

## 2012-06-12 DIAGNOSIS — S8000XA Contusion of unspecified knee, initial encounter: Secondary | ICD-10-CM | POA: Insufficient documentation

## 2012-06-12 DIAGNOSIS — W010XXA Fall on same level from slipping, tripping and stumbling without subsequent striking against object, initial encounter: Secondary | ICD-10-CM | POA: Insufficient documentation

## 2012-06-12 DIAGNOSIS — Z8739 Personal history of other diseases of the musculoskeletal system and connective tissue: Secondary | ICD-10-CM | POA: Insufficient documentation

## 2012-06-12 DIAGNOSIS — F0781 Postconcussional syndrome: Secondary | ICD-10-CM

## 2012-06-12 DIAGNOSIS — S8002XA Contusion of left knee, initial encounter: Secondary | ICD-10-CM

## 2012-06-12 DIAGNOSIS — Y93G2 Activity, grilling and smoking food: Secondary | ICD-10-CM | POA: Insufficient documentation

## 2012-06-12 DIAGNOSIS — Z853 Personal history of malignant neoplasm of breast: Secondary | ICD-10-CM | POA: Insufficient documentation

## 2012-06-12 NOTE — ED Notes (Signed)
I also had a concussion and I'm still experiencing same symptoms.

## 2012-06-12 NOTE — ED Notes (Signed)
Pt. Stated, I'm here for knee pain from a previous fall and I continue to fall.

## 2012-06-12 NOTE — ED Provider Notes (Addendum)
History  Scribed for Ward Givens, MD, the patient was seen in room TR06C/TR06C. This chart was scribed by Lewanda Rife, ED scribe. Patient's care was started at 7:08 pm.  CSN: [id number]  Arrival date & time 06/12/12  1704   First MD Initiated Contact with Patient 06/12/12 1729      Chief Complaint  Patient presents with  . Knee Pain    (Consider location/radiation/quality/duration/timing/severity/associated sxs/prior treatment) HPI Jessica Horn is a 32 y.o. female who presents to the Emergency Department complaining of a steady constant right knee pain and worsening concussion onset 6 days after fall. Pt reports she tripped while grilling landing on her right knee and then the right side of head. Pt denies loss of consciousness and was evaluated in the ED that day. Pt reports left-sided headache waxes and wanes today. Pt reports headaches are episodic lasting 30 minutes each time and resolve on their own. Pt denies emesis, and nausea at this time. Pt reports she's had 20 falls in the last 2 days, such as falling down stairs, and dropping daughter on ice. Pt states she "wobbles" before she is about to fall, which is a "warning". Pt reports repeating the same words and speech "trailing off" at times. Pt states "I normally don't talk this slowly and I am [employer] educated." Pt additionally reports worsening confusion and coordination since fall 6 days ago. Pt states she is able to walk, but limps. Pt states she took prescribed percocet and reports moderate relief of symptoms. Pt reports symptoms are aggravated by nothing.  Pt denies smoking.   Pt states she is now walking with a limp and is using a cane. She is using the volaren gel and it makes her knee feel better, but she is falling and stumbling. Her husband reports she yells out "Catch me" and he is able to run and keep her from falling. She also has a left sided headache that lasts about 30 minutes but denies photophobia but  states "I have stayed in a dark room since the fall anyway, just watching TV". Did have nausea the first couple of days but not now.  She denies numbness but has tingling but cannot tell me where.   PCP Dr. Beverely Low seen yesterday and given prescription for lyrica   Past Medical History  Diagnosis Date  . Fibromyalgia   . Back pain   . Cancer     breast CA at 32 y/o    Past Surgical History  Procedure Date  . Tubal ligation     Family History  Problem Relation Age of Onset  . Hypertension Mother   . Cancer Maternal Aunt     breast  . Kidney disease Maternal Grandmother   . Diabetes Paternal Grandfather     History  Substance Use Topics  . Smoking status: Never Smoker   . Smokeless tobacco: Not on file  . Alcohol Use: Yes  Lives at home Lives with spouse and daughter  OB History    Grav Para Term Preterm Abortions TAB SAB Ect Mult Living                  Review of Systems  Constitutional: Negative.  Negative for fever.  Respiratory: Negative.   Cardiovascular: Negative.   Gastrointestinal: Negative.   Musculoskeletal: Positive for myalgias (right knee pain).  Skin: Negative.   Neurological: Positive for numbness and headaches.  Hematological: Negative.   Psychiatric/Behavioral: Negative.   All other systems reviewed and are negative.  A complete 10 system review of systems was obtained and all systems are negative except as noted in the HPI and PMH.   Allergies  Review of patient's allergies indicates no known allergies.  Home Medications   Current Outpatient Rx  Name  Route  Sig  Dispense  Refill  . DICLOFENAC SODIUM 1 % TD GEL   Topical   Apply 2 g topically 4 (four) times daily.   100 g   0   . ONDANSETRON HCL 4 MG PO TABS   Oral   Take 1 tablet (4 mg total) by mouth every 6 (six) hours as needed for nausea.   20 tablet   0   . OXYCODONE-ACETAMINOPHEN 5-325 MG PO TABS   Oral   Take 2 tablets by mouth every 4 (four) hours as needed for  pain.   15 tablet   0   . PREGABALIN 75 MG PO CAPS   Oral   Take 1 capsule (75 mg total) by mouth 2 (two) times daily.   60 capsule   3     BP 110/61  Pulse 89  Temp 99.1 F (37.3 C) (Oral)  Resp 18  SpO2 100%  LMP 05/18/2012  Vital signs normal    Physical Exam  Nursing note and vitals reviewed. Constitutional: She is oriented to person, place, and time. She appears well-developed and well-nourished.  Non-toxic appearance. She does not appear ill. No distress.  HENT:  Head: Normocephalic and atraumatic.  Right Ear: External ear normal.  Left Ear: External ear normal.  Nose: Nose normal. No mucosal edema or rhinorrhea.  Mouth/Throat: Oropharynx is clear and moist and mucous membranes are normal. No dental abscesses or uvula swelling.  Eyes: Conjunctivae normal and EOM are normal. Pupils are equal, round, and reactive to light.  Neck: Normal range of motion and full passive range of motion without pain. Neck supple.  Cardiovascular: Normal rate, regular rhythm and normal heart sounds.  Exam reveals no gallop and no friction rub.   No murmur heard. Pulmonary/Chest: Effort normal and breath sounds normal. No respiratory distress. She has no wheezes. She has no rhonchi. She has no rales. She exhibits no tenderness and no crepitus.  Abdominal: Soft. Normal appearance and bowel sounds are normal. She exhibits no distension. There is no tenderness. There is no rebound and no guarding.  Musculoskeletal: Normal range of motion. She exhibits no edema and no tenderness.       Abrasion healing on the lateral aspect of her right knee with no joint effusion.   Neurological: She is alert and oriented to person, place, and time. She has normal strength. No cranial nerve deficit.       No focal neurological weakness, cranial nerves intact  Skin: Skin is warm, dry and intact. No rash noted. No erythema. No pallor.  Psychiatric: She has a normal mood and affect. Her speech is normal and  behavior is normal. Her mood appears not anxious.    ED Course  Procedures (including critical care time)   Patient placed in a knee immobilizer and will referred to orthopedics.  We discussed that a lot of her symptoms are very typical for post concussive headaches and also the feeling of having trouble concentrating. This relieved the patient. She will be given the number for the neurologist she was given before.    Ct Head Wo Contrast  06/12/2012  *RADIOLOGY REPORT*  Clinical Data: Trauma to head 6 days ago.  Persistent left-sided headaches.  CT  HEAD WITHOUT CONTRAST  Technique:  Contiguous axial images were obtained from the base of the skull through the vertex without contrast.  Comparison: CT head without contrast 06/07/2012.  Findings: No acute intracranial abnormality is present. Specifically, there is no evidence for acute infarct, hemorrhage, mass, hydrocephalus, or extra-axial fluid collection.  The paranasal sinuses and mastoid air cells are clear.  The globes and orbits are intact.  The osseous skull is intact.  IMPRESSION: Negative CT of the head   Original Report Authenticated By: Marin Roberts, M.D.      1. Post concussive syndrome   2. Contusion of knee, left     Plan discharge  Devoria Albe, MD, FACEP   MDM   I personally performed the services described in this documentation, which was scribed in my presence. The recorded information has been reviewed and considered.  Devoria Albe, MD, FACEP    Ward Givens, MD 06/12/12 2440  Ward Givens, MD 06/12/12 [phone]

## 2012-06-12 NOTE — ED Notes (Signed)
Pt. States, I don't know why I fall I think its more mentally

## 2012-06-12 NOTE — Progress Notes (Signed)
Orthopedic Tech Progress Note Patient Details:  Jessica Horn 02-08-1981 [id number]  Ortho Devices Type of Ortho Device: Knee Immobilizer Ortho Device/Splint Location: right leg Ortho Device/Splint Interventions: Application   Nikki Dom 06/12/2012, 8:43 PM

## 2012-06-13 NOTE — Assessment & Plan Note (Signed)
New.  Pt reports involuntary muscle spasm, inability to control her motor movements, and now depends on cane to ambulate.  Pt has not had neuro evaluation.  Referral made
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
## 2012-06-13 NOTE — Assessment & Plan Note (Signed)
New to provider, ongoing for pt.  According to ER note from September 2013, 'Patient has had prescriptions filled for oxycodone and hydrocodone several times in May of this year as well as a prescription in July under the last name Zollie Beckers at different locations in the stay including [place], [place]. Prior to this she did have regular narcotic prescriptions by Dr. Marita Kansas under the last name Sacra up until March of this year.'  This is obviously concerning as there seems to be no obvious cause of pt's pain.  No controlled substances prescribed today- pt started on Lyrica.

## 2012-06-13 NOTE — Assessment & Plan Note (Signed)
New.  Reviewed pt's recent ER notes, no documented weight.  Very unlikely pt has fluctuated 85 lbs in 3 weeks but this is her claim.  Will get labs to r/o metabolic disorder.

## 2012-06-13 NOTE — Assessment & Plan Note (Signed)
Pt claims to sleep no more than 1 hr/day for the months to years.  However she is well rested in the office today.  Will not prescribe meds at this time as pt apparently has a problem w/ controlled substances.  Pt to f/u w/ neuro.

## 2012-06-14 ENCOUNTER — Encounter: Payer: Self-pay | Admitting: *Deleted

## 2012-06-15 ENCOUNTER — Telehealth: Payer: Self-pay | Admitting: Family Medicine

## 2012-06-15 NOTE — Telephone Encounter (Signed)
Since we started the Lyrica for the pain we did not start a depression med b/c I didn't want to start 2 new meds at one time.  Pt was strongly encouraged to f/u w/ psych for both counseling and appropriate meds.

## 2012-06-15 NOTE — Telephone Encounter (Signed)
Patient states she spoke w/Dr. Beverely Low about getting depression/anxiety meds at her last visit, but she never received any. She would like to know what she should do. CB# [phone]

## 2012-06-15 NOTE — Telephone Encounter (Signed)
Spoke with the pt and informed her that Dr. Beverely Low stated that since we started her on Lyrica for the pain we did not start a depression med b/c she did not want to start 2 new meds at the same time.  And that she agreed to f/u with the psych for both counseling and appropriate meds.  Pt stated that what she thought.  I also informed her of this information when I gave her the paperwork at the end of her visit.  Pt stated that I may have.  Pt agreed to everything.//AB/CMA

## 2012-06-15 NOTE — Telephone Encounter (Signed)
Left message on voicemail for patient to return call when available   

## 2012-06-16 ENCOUNTER — Ambulatory Visit: Payer: BC Managed Care – PPO | Admitting: Family Medicine

## 2012-06-22 ENCOUNTER — Encounter: Payer: Self-pay | Admitting: Lab

## 2012-06-23 ENCOUNTER — Ambulatory Visit: Payer: Medicaid Other | Admitting: Family Medicine

## 2012-06-23 ENCOUNTER — Telehealth: Payer: Self-pay | Admitting: *Deleted

## 2012-06-23 ENCOUNTER — Ambulatory Visit: Payer: BC Managed Care – PPO | Admitting: Family Medicine

## 2012-06-23 DIAGNOSIS — G894 Chronic pain syndrome: Secondary | ICD-10-CM

## 2012-06-23 MED ORDER — TRAMADOL HCL 50 MG PO TABS: 50.0000 mg | ORAL_TABLET | Freq: Three times a day (TID) | ORAL | Status: DC | PRN

## 2012-06-23 NOTE — Telephone Encounter (Signed)
Rx for tramadol sent to [facility] on [address], pt made aware.

## 2012-06-23 NOTE — Telephone Encounter (Signed)
Patient was contacted by office to cancel this afternoon's appt due to the inclement weather. Patient stated that she "thought closing was ridiculous because she is from [place] and they only closed when there are 8" or more of snow." Patient stated that the lyrica is not helping her pain and she  Has "doubled and even tripled the dose" but it still did not help. Wants to know if she can try something else for pain. Please advise.

## 2012-06-23 NOTE — Telephone Encounter (Signed)
Please tell pt that it is not safe for her to increase meds on her own.  It was noted in her ER visit from Sept 2013 that she was getting pain meds using various names from [place], [place] to [facility] Zollie Beckers, Abel Presto, Kyley Walter-Santori)  At this time, she can have Ultram 50mg  TID prn.  #60, no refills

## 2012-06-23 NOTE — Telephone Encounter (Signed)
Left msg on voice mail stating that Dr. Beverely Low would prescribe Ultram for pain if she wants it. Advised pt to call office and let us know if she wanted that.

## 2012-06-29 ENCOUNTER — Telehealth: Payer: Self-pay | Admitting: Family Medicine

## 2012-06-29 NOTE — Telephone Encounter (Signed)
Pt will definitely need 30 minutes If pt doesn't keep her scheduled neuro appt tomorrow she will be dismissed for failure to follow tx plan.  Pt cannot call and be rude to staff- it is unacceptable behavior.

## 2012-06-29 NOTE — Telephone Encounter (Signed)
Just an FYI pt called to reschedule appt. Pt states we cancelled her appt due to the weather but never offered her a new on. Pt was offered a resch when called last week and was so aggressive with me about her appt.,  I transferred her to Vidal Schwalbe to speak with. Pt stated on call today that her appt should be more than a follow up as she had questions at her last visit that have never been answered. I offered pt the 8:30CPE slot available for tomorrow pt refused stated that is not enough notice for her, I also offered her every other day this week as well as Monday and pt refused. Pt is scheduled for 2.26.14 at 1:30, will she need 30-minutes?

## 2012-06-29 NOTE — Telephone Encounter (Signed)
appt has been updated & a perm, comment has been entered in patients chart to reflect pt always needs 30 min appt

## 2012-07-02 ENCOUNTER — Encounter: Payer: Self-pay | Admitting: Neurology

## 2012-07-06 ENCOUNTER — Encounter: Payer: Self-pay | Admitting: Lab

## 2012-07-07 ENCOUNTER — Ambulatory Visit (INDEPENDENT_AMBULATORY_CARE_PROVIDER_SITE_OTHER): Payer: BC Managed Care – PPO | Admitting: Family Medicine

## 2012-07-07 ENCOUNTER — Telehealth: Payer: Self-pay | Admitting: Family Medicine

## 2012-07-07 ENCOUNTER — Encounter: Payer: Self-pay | Admitting: Family Medicine

## 2012-07-07 VITALS — BP 120/80 | HR 93 | Temp 98.5°F | Ht 63.25 in | Wt 147.6 lb

## 2012-07-07 DIAGNOSIS — F329 Major depressive disorder, single episode, unspecified: Secondary | ICD-10-CM

## 2012-07-07 DIAGNOSIS — G4700 Insomnia, unspecified: Secondary | ICD-10-CM

## 2012-07-07 DIAGNOSIS — R52 Pain, unspecified: Secondary | ICD-10-CM

## 2012-07-07 DIAGNOSIS — F3289 Other specified depressive episodes: Secondary | ICD-10-CM

## 2012-07-07 DIAGNOSIS — G894 Chronic pain syndrome: Secondary | ICD-10-CM

## 2012-07-07 DIAGNOSIS — G8929 Other chronic pain: Secondary | ICD-10-CM

## 2012-07-07 MED ORDER — HYDROCODONE-ACETAMINOPHEN 5-325 MG PO TABS: 1.0000 | ORAL_TABLET | Freq: Four times a day (QID) | ORAL | Status: DC | PRN

## 2012-07-07 MED ORDER — TRAZODONE HCL 50 MG PO TABS: 25.0000 mg | ORAL_TABLET | Freq: Every evening | ORAL | Status: DC | PRN

## 2012-07-07 MED ORDER — TRAMADOL HCL 50 MG PO TABS: 50.0000 mg | ORAL_TABLET | Freq: Three times a day (TID) | ORAL | Status: DC | PRN

## 2012-07-07 NOTE — Patient Instructions (Addendum)
We'll call you with your pain management appt Continue the tramadol as needed, using the hydrocodone only for severe pain Start the Trazodone nightly for sleep Your sexual dysfunction is most likely due to your depression and should improve as that does Keep your appt with the psychiatrist and follow up as scheduled w/ neuro Call with any questions or concerns Hang in there!!!

## 2012-07-07 NOTE — Assessment & Plan Note (Signed)
Chronic problem.  Pt has not seen psych.  Suspect her sexual dysfxn is due to ongoing depression.  Will defer tx until pt has psych evaluation.

## 2012-07-07 NOTE — Assessment & Plan Note (Signed)
Chronic problem.  Will start trazodone.

## 2012-07-07 NOTE — Assessment & Plan Note (Signed)
Ongoing.  Only '40%' improved w/ tramadol.  It is documented in ER notes that pt was obtaining scripts under multiple names from multiple pharmacies in various towns.  Attempted to look up pt in controlled substance database but no current access.  Will prescribe small quantity of pain meds and pt to f/u w/ pain management.  Will get controlled substance agreement and UDS.

## 2012-07-07 NOTE — Telephone Encounter (Signed)
pt called is out of tramadol---advised pt we sent in 60 2.12.14-pt advised she pickes them up when prescribed but is out and needs. pt notes she did take UDS cb# [phone] Pt notes she is at [employer] now shopping and would like to get today if possible Please call pt back so she can get while there

## 2012-07-07 NOTE — Progress Notes (Signed)
  Subjective:    Patient ID: Jessica Horn, female    DOB: November 14, 1980, 32 y.o.   MRN: [id number]  HPI Insomnia- 'still not sleeping at all'.  Pt reports she won't sleep 'at all' at night and will get 'maybe 3 hrs' during the day.  'sexual issues'- x6 months, 'unable to perform', 'no enjoyment'.  Decreased libido.  Pt denies relationship problems.  sxs started around the time pain was increasing.  Pt w/ hx of depression.  Pt has appt upcoming w/ psychiatrist.    Pain management- tramadol was 'only 40-45% effective'.  Still having back pain.  Stopped the Lyrica b/c it was ineffective.  Has MRI next week- pt not clear on what area is being scanned.  'i want something that works'.   Review of Systems For ROS see HPI     Objective:   Physical Exam  Vitals reviewed. Constitutional: She is oriented to person, place, and time. She appears well-developed and well-nourished. No distress.  HENT:  Head: Normocephalic and atraumatic.  Neck: Normal range of motion. Neck supple. No thyromegaly present.  Cardiovascular: Normal rate, regular rhythm and normal heart sounds.   Pulmonary/Chest: Effort normal and breath sounds normal. No respiratory distress. She has no wheezes. She has no rales.  Musculoskeletal: Normal range of motion. She exhibits no edema.  Lymphadenopathy:    She has no cervical adenopathy.  Neurological: She is alert and oriented to person, place, and time. Coordination normal.  Skin: Skin is warm and dry.  Psychiatric:  Completely different than last visit- calm, linear thought process, appropriate affect          Assessment & Plan:

## 2012-07-13 ENCOUNTER — Encounter: Payer: Self-pay | Admitting: Physical Medicine & Rehabilitation

## 2012-07-28 ENCOUNTER — Ambulatory Visit: Payer: Self-pay | Admitting: Neurology

## 2012-07-28 ENCOUNTER — Ambulatory Visit: Payer: BC Managed Care – PPO | Admitting: Physical Medicine & Rehabilitation

## 2012-07-28 ENCOUNTER — Encounter: Payer: Self-pay | Admitting: Family Medicine

## 2012-07-30 ENCOUNTER — Other Ambulatory Visit: Payer: Self-pay | Admitting: Family Medicine

## 2012-08-02 ENCOUNTER — Telehealth: Payer: Self-pay | Admitting: Family Medicine

## 2012-08-02 NOTE — Telephone Encounter (Signed)
dup request--request sent to MD and pending approval      KP

## 2012-08-02 NOTE — Telephone Encounter (Signed)
REFILLS ON TRAMA-DOL HCL 50 MG TAB  #60  SIG: TAKE ONE TABLET BY MOUTH EVERY 8 HOURS AS NEEDED FOR PAIN LAST FILLED : 02.26.2014    NORCO 5-325 MG TAB #30 SIG: TAKE ONE TABLET BY MOUTH EVERY 6 HOURS AS NEEDED FOR PAIN LAST FILLED : 02.26.2014

## 2012-08-02 NOTE — Telephone Encounter (Signed)
Both seen and filled 07/07/12. Please advise      KP//CMA

## 2012-08-03 ENCOUNTER — Telehealth: Payer: Self-pay | Admitting: *Deleted

## 2012-08-03 NOTE — Telephone Encounter (Signed)
We have no record of pt having cancer (no ER notes, no Cancer Center notes).  Please ask her what type of cancer she has and where she is being treated.

## 2012-08-03 NOTE — Telephone Encounter (Signed)
Patient sent a friend to pick up her script for norco, I advised that we could not release this type of Rx to anyone other than the pt themselves or a blood relative. Pts friend states that the pt "is on bedrest because she has cancer and her chemo makes her very sick." I advised the friend to have a 1st degree blood relative come pick up script if pt was unable to do so.

## 2012-08-04 ENCOUNTER — Telehealth: Payer: Self-pay | Admitting: *Deleted

## 2012-08-04 NOTE — Telephone Encounter (Signed)
Patient called back to state that "she is cancer free and her friend must have been thinking about her sister when she was speaking with me yesterday." Patient also stated that "her friend didn't remember saying that to me." Patient stated that she told her friend "you must have said that if they are calling me asking about it." Patient apologized for the misunderstanding and again assured me that she did not have cancer.

## 2012-08-04 NOTE — Telephone Encounter (Signed)
Called pts home, lmovm to return call. We are attempting to have records sent from oncologist pt is seeing for her cancer.

## 2012-08-09 ENCOUNTER — Encounter: Payer: Self-pay | Admitting: Physical Medicine & Rehabilitation

## 2012-08-09 ENCOUNTER — Encounter
Payer: BC Managed Care – PPO | Attending: Physical Medicine & Rehabilitation | Admitting: Physical Medicine & Rehabilitation

## 2012-08-09 VITALS — BP 120/84 | HR 102 | Resp 14 | Ht 63.0 in | Wt 149.0 lb

## 2012-08-09 DIAGNOSIS — F3289 Other specified depressive episodes: Secondary | ICD-10-CM

## 2012-08-09 DIAGNOSIS — R259 Unspecified abnormal involuntary movements: Secondary | ICD-10-CM | POA: Insufficient documentation

## 2012-08-09 DIAGNOSIS — M7918 Myalgia, other site: Secondary | ICD-10-CM

## 2012-08-09 DIAGNOSIS — R251 Tremor, unspecified: Secondary | ICD-10-CM

## 2012-08-09 DIAGNOSIS — M542 Cervicalgia: Secondary | ICD-10-CM | POA: Insufficient documentation

## 2012-08-09 DIAGNOSIS — F341 Dysthymic disorder: Secondary | ICD-10-CM | POA: Insufficient documentation

## 2012-08-09 DIAGNOSIS — Z79899 Other long term (current) drug therapy: Secondary | ICD-10-CM

## 2012-08-09 DIAGNOSIS — G4700 Insomnia, unspecified: Secondary | ICD-10-CM

## 2012-08-09 DIAGNOSIS — G894 Chronic pain syndrome: Secondary | ICD-10-CM

## 2012-08-09 DIAGNOSIS — Z9221 Personal history of antineoplastic chemotherapy: Secondary | ICD-10-CM | POA: Insufficient documentation

## 2012-08-09 DIAGNOSIS — F329 Major depressive disorder, single episode, unspecified: Secondary | ICD-10-CM

## 2012-08-09 DIAGNOSIS — Z853 Personal history of malignant neoplasm of breast: Secondary | ICD-10-CM | POA: Insufficient documentation

## 2012-08-09 DIAGNOSIS — M546 Pain in thoracic spine: Secondary | ICD-10-CM | POA: Insufficient documentation

## 2012-08-09 DIAGNOSIS — Z5181 Encounter for therapeutic drug level monitoring: Secondary | ICD-10-CM

## 2012-08-09 DIAGNOSIS — R269 Unspecified abnormalities of gait and mobility: Secondary | ICD-10-CM | POA: Insufficient documentation

## 2012-08-09 DIAGNOSIS — G8929 Other chronic pain: Secondary | ICD-10-CM | POA: Insufficient documentation

## 2012-08-09 DIAGNOSIS — IMO0001 Reserved for inherently not codable concepts without codable children: Secondary | ICD-10-CM

## 2012-08-09 DIAGNOSIS — Z923 Personal history of irradiation: Secondary | ICD-10-CM | POA: Insufficient documentation

## 2012-08-09 MED ORDER — MELOXICAM 15 MG PO TABS: 15.0000 mg | ORAL_TABLET | Freq: Every day | ORAL | Status: DC

## 2012-08-09 MED ORDER — METHOCARBAMOL 500 MG PO TABS: 500.0000 mg | ORAL_TABLET | Freq: Four times a day (QID) | ORAL | Status: DC | PRN

## 2012-08-09 NOTE — Progress Notes (Signed)
Subjective:    Patient ID: Jessica Horn, female    DOB: 04/04/1981, 32 y.o.   MRN: [id number]  HPI  This is an initial office visit for Jessica Horn for chronic back pain. She is a pleasant, 32 yo african [place] female who began to have "spinal" back pain from her neck to low back about two years ago. She doesn't remember a specific anteceding incident although she does recall the pain being worse after a prolonged 4 month pre-labor ordeal prior to the birth of her child. The process/event was traumatic for her from an emotional and physical standpoint.  She has been falling increasingly so over the last 2 years as well.. She states that she's always been "clumsy" and attributes that to her falls. Her parents even sent her for an assessment and work up, but she tells me all the exams came back negative.   Her pain and balance issues have gradually worsened over the last two years causing her to stop working. (She previously was an [employer] cabin crew.)  Because of her pain and balance issues, she is now using a cane. She has tried herbal remedies, tylenol, chiropractory, exercise, and other various remedies she read or heard about. She was taking up to 2000mg  of tylenol before she stopped taking these.  Currently she uses hydrocodone and tramadol together 4 x per day and it only decreases her pain levels to 7-8/10.  She may have been given naproxen  Yesterday she fell down a flight of stairs while on the phone. She slipped at the top of the stairs before she got to the rail and tumbled all the way down.  She saw [facility] a few weeks ago for her balance issues as well as twitching.  An MRI of her brain   was ordered as well as "a bunch of labwork." I received the MRI without contrast, and the report states "normal MRI of brain."  Jessica Horn hasn't had formal physical therapy, but has a good friend who's a PT who has done lower extremity exercises and balance work with her at home.   Her  pain is made worse with any type of movement. It improves by lying flat in bed although she has difficulty with sleep. The hardest part for her is falling to sleep although if she does fall asleep, she often will wake up after 2 hours. Once she awakens, she cannot fall back to sleep. She naps sometimes during the day, and sometimes this is the only sleep she has. She spends much of the day in the house and supine.  She was treated for breast cancer 10 years ago. She received CTX and XRT. She hasn't seen an oncologist for 5 years. She didn't tolerate the XRT very well as it caused her to lose her hair and other dermatological side effects. She had a very bad experience with her treating oncologist at that time which was emotionally traumatic for her and has caused her to avoid visiting other doctors for follow up.  Jessica Horn has noticed weakness in her legs over the last couple years. She doesn't feel it is associated with her lack of activity. She denies any sensory loss, hearing loss, acute visual changes, speech or swallowing problems. She is continent of bowel and bladder. She denies spasms.      Pain Inventory Average Pain 9 Pain Right Now 9 My pain is constant, sharp and stabbing  In the last 24 hours, has pain interfered with the following? General activity 10 Relation  with others 10 Enjoyment of life 10 What TIME of day is your pain at its worst? all the time Sleep (in general) Poor  Pain is worse with: walking, bending, sitting and standing Pain improves with: n/a Relief from Meds: 2  Mobility walk with assistance use a cane how many minutes can you walk? 0 ability to climb steps?  no do you drive?  no Do you have any goals in this area?  yes  Function disabled: date disabled 32 I need assistance with the following:  bathing and household duties  Neuro/Psych weakness trouble walking depression anxiety  Prior Studies x-rays CT/MRI  Physicians involved in your  care [facility]   Family History  Problem Relation Age of Onset  . Hypertension Mother   . Anemia Mother   . Cancer Maternal Aunt     breast  . Kidney disease Maternal Grandmother   . Diabetes Maternal Grandmother   . Diabetes Paternal Grandfather   . Lupus Other   . Multiple sclerosis Other    History   Social History  . Marital Status: Married    Spouse Name: N/A    Number of Children: N/A  . Years of Education: N/A   Social History Main Topics  . Smoking status: Never Smoker   . Smokeless tobacco: None  . Alcohol Use: 0.6 oz/week    1 Glasses of wine per week  . Drug Use: 2.00 per week    Special: Marijuana  . Sexually Active: None   Other Topics Concern  . None   Social History Narrative  . None   Past Surgical History  Procedure Laterality Date  . Tubal ligation     Past Medical History  Diagnosis Date  . Fibromyalgia   . Back pain   . Cancer     breast CA at 32 y/o   BP 120/84  Pulse 102  Resp 14  Ht 5\' 3"  (1.6 m)  Wt 149 lb (67.586 kg)  BMI 26.4 kg/m2  SpO2 96%  LMP 05/14/2012     Review of Systems  Constitutional: Positive for appetite change and unexpected weight change.  Gastrointestinal: Positive for nausea and vomiting.  Musculoskeletal: Positive for gait problem.  Neurological: Positive for weakness.  Hematological: Bruises/bleeds easily.  Psychiatric/Behavioral: Positive for dysphoric mood. The patient is nervous/anxious.   All other systems reviewed and are negative.       Objective:   Physical Exam   General: Alert and oriented x 3, Appears generally uncomfortable. HEENT: Head is normocephalic, atraumatic, PERRLA, EOMI, sclera anicteric, oral mucosa pink and moist, dentition intact, ext ear canals clear,  Neck: Supple without JVD or lymphadenopathy Heart: Reg rate and rhythm. No murmurs rubs or gallops Chest: CTA bilaterally without wheezes, rales, or rhonchi; no distress Abdomen: Soft, non-tender,  non-distended, bowel sounds positive. Extremities: No clubbing, cyanosis, or edema. Pulses are 2+ Skin: Clean and intact without signs of breakdown Neuro: Pt is cognitively appropriate with normal insight, memory, and awareness. Occasionally had mild attention issues.  Cranial nerves 2-12 are intact. I saw no nystagmus.  Sensory exam is normal for PP and LT although proprioception was decreased in both legs, feet seemed to be somewhat intact. She had diminished FMC of both arms and legs and had difficulty with heel to shin, finger to nose and rapid alternating movements. I would even call it a low amplitude ataxia. She has intentional tremor in both hands.  Reflexes are 2+ in all 4's.   Motor function is  grossly 5/5 in UE's with pain inhibition. She displayed 4/5 strength in both lower extremities, perhaps 4- proximally. Gait did not appear ataxic, but the pain referred from her back was causing significant pain with every step she took. She uses the cane to unload. Musculoskeletal: She displays a mild dextroscoliosis of the thoracic spine. Seemed to have an excessive head forward posture. Had pain with palpation of her low cervical to thoracic paraspinals and spinous processes bilaterally. She was only able to tolerate minimal pressure. She displayed pain in the aforementioned areas with lumbar flexion, torso rotation, and side bending. SLR and seated slump were negative. Patrick's was negative. Psych: Pt's affect is appropriate. Pt is cooperative. She was very pleasant.        Assessment & Plan:  1. Chronic cervico-thoracic axial spine pain.  Origin could be multifactorial given her history, although recent xrays were unremarkable.  2. 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 Depression with anxiety (PTSD?) 4. Insomnia   Plan:  1. Will order MRI's of the cervical and thoracic spine to assess disks and to assess for any occult metastatic disease 2. Follow up with [facility] for gait  disorder, tremor, ataxia work up. Could this be some hereditary ataxia or neuropathy? 3. Will retry NSAID and muscle relaxant. She has had some success with muscle relaxants in the past but never received them for a sustained period of time. Will try meloxicam 15mg  with robaxin 500mg ---daily and q6 prn respectively. 4. She will use the tramadol and hydrocodone per Dr. Beverely Low as needed for breakthrough pain 5. Asked her to call me with her sleeping medication. We need to adjust or change to assist with her sleep pattern with is essentially non-existent 6. Consider psychological input given her past, as I believe her mood is playing a role as well. 7. Follow up with me in one month. One hour of face to face patient care time was spent during this visit. All questions were encouraged and answered.   I would like to thank Dr. Beverely Low for the opportunity to participate in this interesting case!

## 2012-08-09 NOTE — Progress Notes (Signed)
I made this referral b/c I don't maintain people on long term narcotics (as it is not always/usually the best treatment plan) and require them to see pain management if they want to continue them.  Please let me know if your office will be assuming the role of prescribing her narcotics as this is something I want to stop ASAP.

## 2012-08-09 NOTE — Patient Instructions (Signed)
PLEASE CONTACT [facility] REGARDING YOUR MRI'S.  CHECK ON YOUR SLEEPING MEDICATION AND CALL ME WITH THE NAME.

## 2012-08-10 ENCOUNTER — Telehealth: Payer: Self-pay

## 2012-08-10 NOTE — Telephone Encounter (Signed)
Patient informed to try trazodone 100mg  and melatonin to help with sleep.  She will call if any problems.

## 2012-08-10 NOTE — Telephone Encounter (Signed)
She can start by increasing to 100mg  at bedtime for one week. If no benefit, increase to 150mg   Also try melatonin over the counter 5-10mg  in addition to the trazodone

## 2012-08-10 NOTE — Telephone Encounter (Signed)
Patient called to say the sleeping aid she used to take was Trazadone 50mg .

## 2012-08-15 ENCOUNTER — Other Ambulatory Visit: Payer: BC Managed Care – PPO

## 2012-08-15 ENCOUNTER — Inpatient Hospital Stay
Admission: RE | Admit: 2012-08-15 | Discharge: 2012-08-15 | Disposition: A | Discharge status: Home / Self Care | Payer: BC Managed Care – PPO | Source: Ambulatory Visit | Attending: Physical Medicine & Rehabilitation | Admitting: Physical Medicine & Rehabilitation

## 2012-08-15 ENCOUNTER — Ambulatory Visit
Admission: RE | Admit: 2012-08-15 | Discharge: 2012-08-15 | Disposition: A | Discharge status: Home / Self Care | Payer: BC Managed Care – PPO | Source: Ambulatory Visit | Attending: Physical Medicine & Rehabilitation | Admitting: Physical Medicine & Rehabilitation

## 2012-08-15 DIAGNOSIS — M542 Cervicalgia: Secondary | ICD-10-CM

## 2012-08-15 DIAGNOSIS — R269 Unspecified abnormalities of gait and mobility: Secondary | ICD-10-CM

## 2012-08-15 DIAGNOSIS — M546 Pain in thoracic spine: Secondary | ICD-10-CM

## 2012-08-15 DIAGNOSIS — Z853 Personal history of malignant neoplasm of breast: Secondary | ICD-10-CM

## 2012-08-15 MED ORDER — GADOBENATE DIMEGLUMINE 529 MG/ML IV SOLN
14.0000 mL | Freq: Once | INTRAVENOUS | Status: AC | PRN
  Administered 2012-08-15: 14 mL via INTRAVENOUS

## 2012-08-19 ENCOUNTER — Other Ambulatory Visit: Payer: Self-pay | Admitting: Family Medicine

## 2012-08-19 NOTE — Telephone Encounter (Signed)
Pt is requesting meds 11 days early.  No refills at this time.

## 2012-08-19 NOTE — Telephone Encounter (Signed)
Last OV 07-07-12 , last 07-30-12 #30, #60 tramadol

## 2012-08-20 NOTE — Telephone Encounter (Signed)
Pt was made aware but states that Dr Beverely Low needs to f/u with the pain management she sent her to. Pt states that she is taking the med the way he has advise her to take it. .Please advise

## 2012-08-25 ENCOUNTER — Telehealth: Payer: Self-pay

## 2012-08-25 NOTE — Telephone Encounter (Addendum)
Discuss with patient who indicated that we need to call pain management about med. Advise Pt that per there records she is only to take med for breakthrough pain not scheduled. Pt then states that she was not told that she was advised to take med intermittent. Pt then states that Rx say prn and she needs med so she take it daily scheduled. Pt then says" if she did not want me to take med why did she write it that way". Again advise Pt that it is too soon to Rx med and if she is still have any question I can let her speak with our team lead. Pt states what is she going to do tell me the same thing that you did. Advise Pt I am unsure what she will tell her exactly but she can help her to understand more clearly what I am trying to explain. Pt then states that she has a [employer] degrees so she understand how to read direction. Pt placed on hold to be transferred to Team lead. Pt hung up phone after being placed on hold. Team lead advise of situation in case Pt decides to call back.

## 2012-08-25 NOTE — Telephone Encounter (Signed)
Dr Wille Glaser office called to see if we are going to take over prescribing tramadol and hydrocodone.  Patient has a contract with them for now but Dr Beverely Low wants to know if we will take over prescribing.  Patient is also not taking the medication as needed to she is taking it every 6 to 8 hours, with only a 30 day supply.  Please let us know what you decide to do so we can contact Dr Beverely Low.  (they are aware you are out of town and will not be able to respond right away)

## 2012-08-25 NOTE — Telephone Encounter (Addendum)
Spoke with pharmacy who states that Rx was picked up for the tramadol on 08-23-12 by the Pt. Per Dois Davenport Rx needed to be cancel but since already pick up advise Pt that it was to earlier to fill med and that hydrocodone will not be filled until 08-30-12 because it is too early. Per pain management Pt is to only take med for breakthrough pain not as scheduled. .Left message to call office.    Pain management is suppose to get back with [place] on whether or not they are willing to take over filling Pt med. Physician is out of the office this week.

## 2012-08-30 NOTE — Telephone Encounter (Signed)
I will assume control of her pain medications. Let me know when these need to be rx'ed

## 2012-09-01 ENCOUNTER — Telehealth: Payer: Self-pay | Admitting: *Deleted

## 2012-09-01 NOTE — Telephone Encounter (Signed)
Dr Riley Kill office called stating that from this point on they are taking over filling Pt pain meds. If Pt contact us in reference to refills Pt is to be advise to contact her pain management.

## 2012-09-01 NOTE — Telephone Encounter (Signed)
noted 

## 2012-09-01 NOTE — Telephone Encounter (Signed)
Spoke with Dr Wille Glaser and informed them we will take over pain medications.

## 2012-09-02 ENCOUNTER — Other Ambulatory Visit: Payer: Self-pay | Admitting: Family Medicine

## 2012-09-07 ENCOUNTER — Other Ambulatory Visit: Payer: Self-pay | Admitting: Family Medicine

## 2012-09-07 NOTE — Telephone Encounter (Signed)
Request for Hydrocodone. Pt seen on 2/26 by [facility] and 3/31 by [facility]. Medication last filled on 4/10 #30 with 0 refills. Please advise.

## 2012-09-08 ENCOUNTER — Encounter: Payer: Self-pay | Admitting: Physical Medicine & Rehabilitation

## 2012-09-08 ENCOUNTER — Encounter
Payer: BC Managed Care – PPO | Attending: Physical Medicine & Rehabilitation | Admitting: Physical Medicine & Rehabilitation

## 2012-09-08 VITALS — BP 111/70 | HR 76 | Resp 16 | Ht 63.0 in | Wt 139.0 lb

## 2012-09-08 DIAGNOSIS — R251 Tremor, unspecified: Secondary | ICD-10-CM

## 2012-09-08 DIAGNOSIS — F329 Major depressive disorder, single episode, unspecified: Secondary | ICD-10-CM

## 2012-09-08 DIAGNOSIS — F3289 Other specified depressive episodes: Secondary | ICD-10-CM | POA: Insufficient documentation

## 2012-09-08 DIAGNOSIS — M546 Pain in thoracic spine: Secondary | ICD-10-CM

## 2012-09-08 DIAGNOSIS — IMO0001 Reserved for inherently not codable concepts without codable children: Secondary | ICD-10-CM | POA: Insufficient documentation

## 2012-09-08 DIAGNOSIS — R259 Unspecified abnormal involuntary movements: Secondary | ICD-10-CM

## 2012-09-08 DIAGNOSIS — M542 Cervicalgia: Secondary | ICD-10-CM | POA: Insufficient documentation

## 2012-09-08 DIAGNOSIS — R269 Unspecified abnormalities of gait and mobility: Secondary | ICD-10-CM | POA: Insufficient documentation

## 2012-09-08 DIAGNOSIS — M7918 Myalgia, other site: Secondary | ICD-10-CM

## 2012-09-08 MED ORDER — CITALOPRAM HYDROBROMIDE 20 MG PO TABS: 20.0000 mg | ORAL_TABLET | Freq: Every day | ORAL | Status: DC

## 2012-09-08 MED ORDER — TRAZODONE HCL 100 MG PO TABS: 200.0000 mg | ORAL_TABLET | Freq: Every day | ORAL | Status: DC

## 2012-09-08 MED ORDER — HYDROCODONE-ACETAMINOPHEN 5-325 MG PO TABS: 1.0000 | ORAL_TABLET | Freq: Four times a day (QID) | ORAL | Status: DC | PRN

## 2012-09-08 NOTE — Patient Instructions (Signed)
CALL ME WITH ANY PROBLEMS OR QUESTIONS (#[phone]).  HAVE A GOOD DAY  

## 2012-09-08 NOTE — Progress Notes (Signed)
Subjective:    Patient ID: Jessica Horn, female    DOB: 24-Jan-1981, 32 y.o.   MRN: [id number]  HPI  Jessica Horn is back regarding her chronic pain disorder and gait disorder. I imagined her cervical and thoracic spine via MRI and these studies were fairly unremarkable.   The robaxin was helpful as well as the meloxicam. She had less "tremor" and noticed that her spine was more relaxed. She stopped taking the hydrocodone and tramadol because she was made to feel like a "junkie" by asking for the meds earlier than she's due. The tramadol didn't work as well.   Sleep continues to be an issue. The increased trazodone was not consistently helping her rest (1/7 days).  She reports a lot of stress currently at home. She reports depression which has been severe in the past especially after her hospitalization. She has taken zoloft and cymbalta in the past.   Pain Inventory Average Pain 10 Pain Right Now 10 My pain is constant, sharp and stabbing  In the last 24 hours, has pain interfered with the following? General activity 10 Relation with others 10 Enjoyment of life 10 What TIME of day is your pain at its worst? constant Sleep (in general) Poor  Pain is worse with: walking, bending, standing and some activites Pain improves with: medication Relief from Meds: 2  Mobility use a cane how many minutes can you walk? 3 ability to climb steps?  no do you drive?  no Do you have any goals in this area?  yes  Function disabled: date disabled n/a I need assistance with the following:  dressing, bathing, meal prep and household duties Do you have any goals in this area?  no  Neuro/Psych weakness tremor tingling trouble walking spasms dizziness depression anxiety  Prior Studies Any changes since last visit?  yes  Physicians involved in your care Any changes since last visit?  no   Family History  Problem Relation Age of Onset  . Hypertension Mother   . Anemia  Mother   . Cancer Maternal Aunt     breast  . Kidney disease Maternal Grandmother   . Diabetes Maternal Grandmother   . Diabetes Paternal Grandfather   . Lupus Other   . Multiple sclerosis Other    History   Social History  . Marital Status: Married    Spouse Name: N/A    Number of Children: N/A  . Years of Education: N/A   Social History Main Topics  . Smoking status: Never Smoker   . Smokeless tobacco: None  . Alcohol Use: 0.6 oz/week    1 Glasses of wine per week  . Drug Use: 2.00 per week    Special: Marijuana  . Sexually Active: None   Other Topics Concern  . None   Social History Narrative  . None   Past Surgical History  Procedure Laterality Date  . Tubal ligation     Past Medical History  Diagnosis Date  . Fibromyalgia   . Back pain   . Cancer     breast CA at 32 y/o   BP 111/70  Pulse 76  Resp 16  Ht 5\' 3"  (1.6 m)  Wt 139 lb (63.05 kg)  BMI 24.63 kg/m2  SpO2 97%  LMP 05/14/2012     Review of Systems  Constitutional: Positive for appetite change and unexpected weight change.  Gastrointestinal: Positive for nausea and vomiting.  Musculoskeletal: Positive for gait problem.  Neurological: Positive for dizziness, tremors and weakness.  Psychiatric/Behavioral: Positive for dysphoric mood and agitation.  All other systems reviewed and are negative.       Objective:   Physical Exam General: Alert and oriented x 3, Appears generally uncomfortable.  HEENT: Head is normocephalic, atraumatic, PERRLA, EOMI, sclera anicteric, oral mucosa pink and moist, dentition intact, ext ear canals clear,  Neck: Supple without JVD or lymphadenopathy  Heart: Reg rate and rhythm. No murmurs rubs or gallops  Chest: CTA bilaterally without wheezes, rales, or rhonchi; no distress  Abdomen: Soft, non-tender, non-distended, bowel sounds positive.  Extremities: No clubbing, cyanosis, or edema. Pulses are 2+  Skin: Clean and intact without signs of breakdown  Neuro:  Pt is cognitively appropriate with normal insight, memory, and awareness. Occasionally had mild attention issues. Cranial nerves 2-12 are intact. I saw no nystagmus. Sensory exam is normal for PP and LT although proprioception was decreased in both legs, feet seemed to be somewhat intact. She had diminished FMC of both arms and legs and had difficulty with heel to shin, finger to nose and rapid alternating movements. I would even call it a low amplitude ataxia. She has intentional tremor in both hands. Reflexes are 2+ in all 4's. Motor function is grossly 5/5 in UE's with pain inhibition. She displayed 4/5 strength in both lower extremities, perhaps 4- proximally.  . She uses the cane to unload.  Musculoskeletal: She displays a mild dextroscoliosis of the thoracic spine. Seemed to have an excessive head forward posture. Had pain with palpation of her low cervical to thoracic paraspinals and spinous processes bilaterally. She was only able to tolerate minimal pressure.    Psych: Pt was much more anxious today.   Assessment & Plan:   1. Chronic cervico-thoracic axial spine pain. Origin could be multifactorial given her history, although recent xrays were unremarkable.  2. 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 Depression with anxiety (PTSD?)  4. Insomnia    Plan:  1. Provided her with norco for pain control 5/325 one q6prn #120.  2. Still think she should Follow up with [facility] for gait disorder, tremor, ataxia work up. Could this be some hereditary ataxia or neuropathy?  3. Continue meloxicam and robaxin.  4. Will trial celexa 20mg  qhs for depression. 5. Increase trazodone to 200mg  qhs. Melatonin is an option as well.  6. Pain psychology referral with Dr. Vickii Chafe..  7. Referral to outpt PT for thoracic, lumbar ROM, posture, myofascial assessment, modailies, HEP 8. Follow up with me in one month. of face to face patient care time was spent during this visit. All questions  were encouraged and answered. Pt seems to understand that her pain needs to be treated holistically and is willing to participate in the recommended treatments.

## 2012-09-14 ENCOUNTER — Telehealth: Payer: Self-pay

## 2012-09-14 NOTE — Telephone Encounter (Signed)
Patient called and says she needs a referral for masectomy?  She says she was told to go downstairs to see the oncologist but they will not see her without a referral.  She is also having trouble with urination since she started this last medication.  She was wondering if that was a side effect?

## 2012-09-27 ENCOUNTER — Ambulatory Visit: Payer: BC Managed Care – PPO | Admitting: Physical Therapy

## 2012-10-08 ENCOUNTER — Other Ambulatory Visit: Payer: Self-pay | Admitting: Physical Medicine & Rehabilitation

## 2012-10-08 ENCOUNTER — Encounter (HOSPITAL_BASED_OUTPATIENT_CLINIC_OR_DEPARTMENT_OTHER): Payer: BC Managed Care – PPO | Admitting: Physical Medicine & Rehabilitation

## 2012-10-08 DIAGNOSIS — R259 Unspecified abnormal involuntary movements: Secondary | ICD-10-CM

## 2012-10-08 DIAGNOSIS — F3289 Other specified depressive episodes: Secondary | ICD-10-CM

## 2012-10-08 DIAGNOSIS — R269 Unspecified abnormalities of gait and mobility: Secondary | ICD-10-CM

## 2012-10-08 DIAGNOSIS — M546 Pain in thoracic spine: Secondary | ICD-10-CM

## 2012-10-08 DIAGNOSIS — IMO0001 Reserved for inherently not codable concepts without codable children: Secondary | ICD-10-CM

## 2012-10-08 DIAGNOSIS — M542 Cervicalgia: Secondary | ICD-10-CM

## 2012-10-08 DIAGNOSIS — F329 Major depressive disorder, single episode, unspecified: Secondary | ICD-10-CM

## 2012-10-08 MED ORDER — METHOCARBAMOL 500 MG PO TABS: 500.0000 mg | ORAL_TABLET | Freq: Four times a day (QID) | ORAL | Status: DC | PRN

## 2012-10-08 MED ORDER — HYDROCODONE-ACETAMINOPHEN 5-325 MG PO TABS: 1.0000 | ORAL_TABLET | Freq: Four times a day (QID) | ORAL | Status: DC | PRN

## 2012-10-08 NOTE — Progress Notes (Signed)
Appointment cancelled due to illness. Medications refilled and appointment rescheduled for next month.

## 2012-11-05 ENCOUNTER — Encounter: Payer: Self-pay | Admitting: Physical Medicine & Rehabilitation

## 2012-11-05 ENCOUNTER — Other Ambulatory Visit: Payer: Self-pay | Admitting: Physical Medicine & Rehabilitation

## 2012-11-05 ENCOUNTER — Encounter
Payer: BC Managed Care – PPO | Attending: Physical Medicine & Rehabilitation | Admitting: Physical Medicine & Rehabilitation

## 2012-11-05 VITALS — BP 108/68 | HR 75 | Resp 15 | Ht 62.0 in | Wt 137.0 lb

## 2012-11-05 DIAGNOSIS — IMO0001 Reserved for inherently not codable concepts without codable children: Secondary | ICD-10-CM

## 2012-11-05 DIAGNOSIS — C50919 Malignant neoplasm of unspecified site of unspecified female breast: Secondary | ICD-10-CM | POA: Insufficient documentation

## 2012-11-05 DIAGNOSIS — M7918 Myalgia, other site: Secondary | ICD-10-CM

## 2012-11-05 DIAGNOSIS — R259 Unspecified abnormal involuntary movements: Secondary | ICD-10-CM

## 2012-11-05 DIAGNOSIS — M546 Pain in thoracic spine: Secondary | ICD-10-CM | POA: Insufficient documentation

## 2012-11-05 DIAGNOSIS — M542 Cervicalgia: Secondary | ICD-10-CM

## 2012-11-05 DIAGNOSIS — R251 Tremor, unspecified: Secondary | ICD-10-CM

## 2012-11-05 DIAGNOSIS — R269 Unspecified abnormalities of gait and mobility: Secondary | ICD-10-CM

## 2012-11-05 MED ORDER — FENTANYL 12 MCG/HR TD PT72: 1.0000 | MEDICATED_PATCH | TRANSDERMAL | Status: DC

## 2012-11-05 MED ORDER — HYDROCODONE-ACETAMINOPHEN 5-325 MG PO TABS: 1.0000 | ORAL_TABLET | Freq: Four times a day (QID) | ORAL | Status: DC | PRN

## 2012-11-05 NOTE — Progress Notes (Signed)
Subjective:    Patient ID: Jessica Horn, female    DOB: [birth year], 32 y.o.   MRN: [id number]  HPI  Jessica Horn is back regarding her back pain and gait disorder. She is still having some pain. Her sleep has been better with the increased trazodone and melatonin. She is now getting 5 hours per night.   Her clumsiness and balance haven't changed. She hasn't followed up with neurology but has an appt scheduled for later this summer. Again she recalls balance problems and work ups from when she was a child. She also has family members who are "clumsy". She was told when she was younger that she would "grow out of it."  She continues to have back pain. The hydrocodone works well but runs out at exactly 4 hours.   She hasn't been able to get to therapy due to cost constraints, timing, etc.   She hasn't made an oncology follow up appt yet.     Pain Inventory  Average Pain 9 Pain Right Now 9 My pain is sharp, stabbing and aching  In the last 24 hours, has pain interfered with the following? General activity 8 Relation with others 8 Enjoyment of life 8 What TIME of day is your pain at its worst? constant Sleep (in general) Poor  Pain is worse with: walking, bending, inactivity and standing Pain improves with: heat/ice and medication Relief from Meds: 6  Mobility use a cane how many minutes can you walk? 1 ability to climb steps?  no do you drive?  no needs help with transfers  Function disabled: date disabled na I need assistance with the following:  bathing, meal prep and household duties  Neuro/Psych bladder control problems weakness trouble walking spasms  Prior Studies Any changes since last visit?  no  Physicians involved in your care Any changes since last visit?  no   Family History  Problem Relation Age of Onset  . Hypertension Mother   . Anemia Mother   . Cancer Maternal Aunt     breast  . Kidney disease Maternal Grandmother   . Diabetes Maternal  Grandmother   . Diabetes Paternal Grandfather   . Lupus Other   . Multiple sclerosis Other    History   Social History  . Marital Status: Married    Spouse Name: N/A    Number of Children: N/A  . Years of Education: N/A   Social History Main Topics  . Smoking status: Never Smoker   . Smokeless tobacco: None  . Alcohol Use: 0.6 oz/week    1 Glasses of wine per week  . Drug Use: 2.00 per week    Special: Marijuana  . Sexually Active: None   Other Topics Concern  . None   Social History Narrative  . None   Past Surgical History  Procedure Laterality Date  . Tubal ligation     Past Medical History  Diagnosis Date  . Fibromyalgia   . Back pain   . Cancer     breast CA at 32 y/o   BP 108/68  Pulse 75  Resp 15  Ht 5\' 2"  (1.575 m)  Wt 137 lb (62.143 kg)  BMI 25.05 kg/m2  SpO2 100%  LMP 09/22/2012     Review of Systems  Constitutional: Positive for appetite change and unexpected weight change.  Gastrointestinal: Positive for abdominal pain.  Musculoskeletal: Positive for gait problem.  Neurological: Positive for weakness.       Spasms  All other systems reviewed and  are negative.       Objective:   Physical Exam  General: Alert and oriented x 3, Appears generally uncomfortable.  HEENT: Head is normocephalic, atraumatic, PERRLA, EOMI, sclera anicteric, oral mucosa pink and moist, dentition intact, ext ear canals clear,  Neck: Supple without JVD or lymphadenopathy  Heart: Reg rate and rhythm. No murmurs rubs or gallops  Chest: CTA bilaterally without wheezes, rales, or rhonchi; no distress  Abdomen: Soft, non-tender, non-distended, bowel sounds positive.  Extremities: No clubbing, cyanosis, or edema. Pulses are 2+  Skin: Clean and intact without signs of breakdown  Neuro: Pt is cognitively appropriate with normal insight, memory, and awareness. Occasionally had mild attention issues. Cranial nerves 2-12 are intact. I saw no nystagmus. Sensory exam is  normal for PP and LT although proprioception was decreased in both legs, feet seemed to be somewhat intact. She had diminished FMC of both arms and legs and had difficulty with heel to shin, finger to nose and rapid alternating movements. I would even call it a low amplitude ataxia. She has intentional tremor in both hands. Reflexes are 2+ in all 4's. Motor function is grossly 5/5 in UE's with pain inhibition. She displayed 4/5 strength in both lower extremities, perhaps 4- proximally. . She uses the cane to unload her right pack and tends to lift her right shoulder superiorly to achieve the same. .  Musculoskeletal: She displays a mild dextroscoliosis of the thoracic spine. Seemed to have an excessive head forward posture. Had pain with palpation of her low cervical to thoracic paraspinals and spinous processes bilaterally, particularly on the right. She was only able to tolerate minimal pressure.  Psych: Pt was much more anxious today.   Assessment & Plan:   1. Chronic cervico-thoracic axial spine pain. Origin could be multifactorial given her history, although recent xrays were unremarkable. This appears generally myofascial in nature with her posture/scoliosis also playing a role  2. 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 Depression with anxiety (PTSD?)  4. Insomnia--improved with trazodone and melatonin 5. Hx of breast cancer rx'ed in 2004? With CTX and XRT   Plan:  1. Provided her with norco for pain control 5/325 one q6prn #120.  2. Follow up with [facility] for gait disorder, tremor, ataxia work up. Could this be some hereditary ataxia or neuropathy?  3. DC meloxicam and continue robaxin.  4. Continue celexa 20mg  qhs for depression as it has helped.  5. continue trazodone to 200mg  qhs. Melatonin is an option as well.  6. Pain psychology referral with Dr. Vickii Chafe later this year. 7. Hold on outpt PT for thoracic, lumbar ROM, posture, myofascial assessment, modailies, HEP  until she meets deductible  8. Follow up with me in one month. of face to face patient care time was spent during this visit. All questions were encouraged and answered. Pt seems to understand that her pain needs to be treated holistically and is willing to participate in the recommended treatments.  9. Fentanyl patch trial . Reviewed placements, intenteds effects, side effects 10. Made a referral to oncology at [facility] for follow up. It has been at least 7 years since she has seen an oncologist, and she would like establish follow up here in [place]. I made a referral to Dr. Truett Perna who hopefully will be able to see Zykerria.

## 2012-11-05 NOTE — Patient Instructions (Signed)
CALL ME WITH ANY PROBLEMS OR QUESTIONS (#[phone]).  HAVE A GOOD DAY  

## 2012-11-18 ENCOUNTER — Telehealth: Payer: Self-pay

## 2012-11-18 DIAGNOSIS — M546 Pain in thoracic spine: Secondary | ICD-10-CM

## 2012-11-18 DIAGNOSIS — M7918 Myalgia, other site: Secondary | ICD-10-CM

## 2012-11-18 MED ORDER — FENTANYL 12 MCG/HR TD PT72: 1.0000 | MEDICATED_PATCH | TRANSDERMAL | Status: DC

## 2012-11-18 NOTE — Telephone Encounter (Signed)
Patient says the fentanyl patch is helping.  She needs a refill.  Script printed for Clydie Braun to sign.  Patient aware its ready for pick up.

## 2012-11-24 ENCOUNTER — Other Ambulatory Visit: Payer: Self-pay | Admitting: Physical Medicine & Rehabilitation

## 2012-12-06 ENCOUNTER — Telehealth: Payer: Self-pay

## 2012-12-06 DIAGNOSIS — M542 Cervicalgia: Secondary | ICD-10-CM

## 2012-12-06 DIAGNOSIS — M546 Pain in thoracic spine: Secondary | ICD-10-CM

## 2012-12-06 DIAGNOSIS — M7918 Myalgia, other site: Secondary | ICD-10-CM

## 2012-12-06 NOTE — Telephone Encounter (Signed)
Patient called requesting medication refills.  She needs methocarbamol and says she would like a 30 days supply, last script was only for 15 days.  She also needs hydrocodone and fentanyl patches refilled.  Please advise.

## 2012-12-06 NOTE — Telephone Encounter (Signed)
We can refill all of these. The robaxin is written prn---she may have #120 going forward, however. Why wasn't her appt for a month later? (it's not scheduled until 8/22)

## 2012-12-07 ENCOUNTER — Encounter: Payer: Self-pay | Admitting: Physical Medicine & Rehabilitation

## 2012-12-07 MED ORDER — HYDROCODONE-ACETAMINOPHEN 5-325 MG PO TABS: 1.0000 | ORAL_TABLET | Freq: Four times a day (QID) | ORAL | Status: DC | PRN

## 2012-12-07 MED ORDER — FENTANYL 12 MCG/HR TD PT72: 1.0000 | MEDICATED_PATCH | TRANSDERMAL | Status: DC

## 2012-12-07 MED ORDER — METHOCARBAMOL 500 MG PO TABS: 500.0000 mg | ORAL_TABLET | Freq: Four times a day (QID) | ORAL | Status: DC

## 2012-12-07 NOTE — Telephone Encounter (Signed)
The 12/08/12 appt was cancelled due to her not having the money for it.

## 2012-12-07 NOTE — Telephone Encounter (Signed)
Printed rx for Jessica Horn to sign.  Methocarbamol ordered e scribe

## 2012-12-08 ENCOUNTER — Encounter: Payer: BC Managed Care – PPO | Admitting: Physical Medicine & Rehabilitation

## 2012-12-08 NOTE — Telephone Encounter (Signed)
Left voicemail notifying rxs available for pick up

## 2012-12-31 ENCOUNTER — Encounter: Payer: Self-pay | Admitting: Physical Medicine & Rehabilitation

## 2012-12-31 ENCOUNTER — Encounter
Payer: BC Managed Care – PPO | Attending: Physical Medicine & Rehabilitation | Admitting: Physical Medicine & Rehabilitation

## 2012-12-31 VITALS — BP 116/72 | HR 88 | Resp 14 | Ht 63.0 in | Wt 139.0 lb

## 2012-12-31 DIAGNOSIS — R251 Tremor, unspecified: Secondary | ICD-10-CM

## 2012-12-31 DIAGNOSIS — M542 Cervicalgia: Secondary | ICD-10-CM

## 2012-12-31 DIAGNOSIS — Z79899 Other long term (current) drug therapy: Secondary | ICD-10-CM

## 2012-12-31 DIAGNOSIS — M7918 Myalgia, other site: Secondary | ICD-10-CM

## 2012-12-31 DIAGNOSIS — Z5181 Encounter for therapeutic drug level monitoring: Secondary | ICD-10-CM

## 2012-12-31 DIAGNOSIS — G4700 Insomnia, unspecified: Secondary | ICD-10-CM

## 2012-12-31 DIAGNOSIS — M546 Pain in thoracic spine: Secondary | ICD-10-CM

## 2012-12-31 DIAGNOSIS — R269 Unspecified abnormalities of gait and mobility: Secondary | ICD-10-CM

## 2012-12-31 DIAGNOSIS — IMO0002 Reserved for concepts with insufficient information to code with codable children: Secondary | ICD-10-CM

## 2012-12-31 DIAGNOSIS — R259 Unspecified abnormal involuntary movements: Secondary | ICD-10-CM

## 2012-12-31 DIAGNOSIS — IMO0001 Reserved for inherently not codable concepts without codable children: Secondary | ICD-10-CM

## 2012-12-31 MED ORDER — FENTANYL 12 MCG/HR TD PT72: 1.0000 | MEDICATED_PATCH | TRANSDERMAL | Status: DC

## 2012-12-31 MED ORDER — QUETIAPINE FUMARATE 25 MG PO TABS: 25.0000 mg | ORAL_TABLET | Freq: Every evening | ORAL | Status: DC | PRN

## 2012-12-31 MED ORDER — HYDROCODONE-ACETAMINOPHEN 5-325 MG PO TABS: 1.0000 | ORAL_TABLET | Freq: Four times a day (QID) | ORAL | Status: DC | PRN

## 2012-12-31 NOTE — Progress Notes (Signed)
Subjective:    Patient ID: Jessica Horn, female    DOB: August 07, 1980, 32 y.o.   MRN: [id number]  HPI  Kariann is back regarding her chronic pain. She is still having difficulties falling asleep. She stopped taking the trazodone as it isn't working. Once she falls asleep, she doesn't do too poorly.   The fentanyl was highly effective for her pain. She is not having any skin related issues.   She does complain of fatigue but feels it's more born out of her insomnia.   Her balance remains an issue. She uses a straight cane for support.   Pain Inventory Average Pain 3 Pain Right Now 2 My pain is constant, sharp and stabbing  In the last 24 hours, has pain interfered with the following? General activity 6 Relation with others 6 Enjoyment of life 6 What TIME of day is your pain at its worst? constant Sleep (in general) Fair  Pain is worse with: walking, bending, sitting, inactivity, standing and some activites Pain improves with: rest and medication Relief from Meds: 8  Mobility walk without assistance how many minutes can you walk? 5 ability to climb steps?  yes do you drive?  yes Do you have any goals in this area?  yes  Function disabled: date disabled 2013  Neuro/Psych weakness trouble walking depression  Prior Studies Any changes since last visit?  no  Physicians involved in your care Any changes since last visit?  no   Family History  Problem Relation Age of Onset  . Hypertension Mother   . Anemia Mother   . Cancer Maternal Aunt     breast  . Kidney disease Maternal Grandmother   . Diabetes Maternal Grandmother   . Diabetes Paternal Grandfather   . Lupus Other   . Multiple sclerosis Other    History   Social History  . Marital Status: Married    Spouse Name: N/A    Number of Children: N/A  . Years of Education: N/A   Social History Main Topics  . Smoking status: Never Smoker   . Smokeless tobacco: None  . Alcohol Use: 0.6 oz/week    1  Glasses of wine per week  . Drug Use: 2.00 per week    Special: Marijuana  . Sexual Activity: None   Other Topics Concern  . None   Social History Narrative  . None   Past Surgical History  Procedure Laterality Date  . Tubal ligation     Past Medical History  Diagnosis Date  . Fibromyalgia   . Back pain   . Cancer     breast CA at 32 y/o   BP 116/72  Pulse 88  Resp 14  Ht 5\' 3"  (1.6 m)  Wt 139 lb (63.05 kg)  BMI 24.63 kg/m2  SpO2 95%  LMP 12/20/2012     Review of Systems  Musculoskeletal: Positive for gait problem.  Neurological: Positive for weakness.  Psychiatric/Behavioral: Positive for dysphoric mood.  All other systems reviewed and are negative.       Objective:   Physical Exam General: Alert and oriented x 3, Appears generally uncomfortable.  HEENT: Head is normocephalic, atraumatic, PERRLA, EOMI, sclera anicteric, oral mucosa pink and moist, dentition intact, ext ear canals clear,  Neck: Supple without JVD or lymphadenopathy  Heart: Reg rate and rhythm. No murmurs rubs or gallops  Chest: CTA bilaterally without wheezes, rales, or rhonchi; no distress  Abdomen: Soft, non-tender, non-distended, bowel sounds positive.  Extremities: No clubbing, cyanosis, or  edema. Pulses are 2+  Skin: Clean and intact without signs of breakdown  Neuro: Pt is cognitively appropriate with normal insight, memory, and awareness. Occasionally had mild attention issues. Cranial nerves 2-12 are intact. I saw no nystagmus. Sensory exam is normal for PP and LT although proprioception was decreased in both legs, feet seemed to be somewhat intact. She had diminished FMC of both arms and legs and had difficulty with heel to shin, finger to nose and rapid alternating movements. I would even call it a low amplitude ataxia. She has intentional tremor in both hands. Reflexes are 2+ in all 4's. Motor function is grossly 5/5 in UE's with pain inhibition. She displayed 4/5 strength in both lower  extremities, perhaps 4- proximally. . She uses the cane to unload her right pack and tends to lift her right shoulder superiorly to achieve the same. .  Musculoskeletal: She displays a mild dextroscoliosis of the thoracic spine. Seemed to have an excessive head forward posture. Had pain with palpation of her low cervical to thoracic paraspinals and spinous processes bilaterally, particularly on the right. She was only able to tolerate minimal pressure.  Psych: Pt was much more anxious today.   Assessment & Plan:   1. Chronic cervico-thoracic axial spine pain. Origin could be multifactorial given her history, although recent xrays were unremarkable. This appears generally myofascial in nature with her posture/scoliosis also playing a role  2. 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 Depression with anxiety (PTSD?)  4. Insomnia--persistent 5. Hx of breast cancer rx'ed in 2004? With CTX and XRT    Plan:  1. Provided her with norco for pain control 5/325 one q6prn #120. UDS was collected today 2. Follow up with [facility] for gait disorder, tremor, ataxia work up. Could this be some hereditary ataxia or neuropathy? She may need further referral for testing 4. For some reason she stopped the celexa. For now we'll hold off on resuming as she's doing fairly well.   5. For sleep-encouraged use of relaxation strategies using a book, music, television. If these fail, I gave her an rx for seroquel 25mg  qhs.  6. Pain psychology referral with Dr. Vickii Chafe later this year or next? 7. Hold on outpt PT for thoracic, lumbar ROM, posture, myofascial assessment, modailies, HEP until she meets deductible. HEP for now  8. Follow up with PA in one month. 15 min of face to face patient care time was spent during this visit. All questions were encouraged and answered.   9. Fentanyl patch   was refilled.  10.  Oncology appt at [facility] for follow up. It has been at least 7 years since she has seen an  oncologist, and she would like establish follow up here in [place].

## 2012-12-31 NOTE — Patient Instructions (Signed)
WORK ON RELAXATION TECHNIQUES AT HOME TO HELP YOU SLEEP. READ A BOOK, PUT ON MUSIC OR THE TV TO HELP YOU FALL ASLEEP.

## 2013-01-07 ENCOUNTER — Telehealth: Payer: Self-pay | Admitting: *Deleted

## 2013-01-07 NOTE — Telephone Encounter (Signed)
Left message for pt to return my call so I can get her schedule w/ a med onc.

## 2013-01-09 ENCOUNTER — Other Ambulatory Visit: Payer: Self-pay | Admitting: Physical Medicine & Rehabilitation

## 2013-01-10 ENCOUNTER — Telehealth: Payer: Self-pay | Admitting: Family Medicine

## 2013-01-10 NOTE — Telephone Encounter (Signed)
Patient referred to the [facility] practice emergency line accidentally. She is not a [facility] patient. In short she had a recent fall down the stairs that she has not been evaluated. She quickly states her chronic medications of fentanyl, hydrocodone and muscle relaxer and wanted advice on what she could take in addition to the above safely, to help manage her pain. I advised her to come to the ED for evaluation ASAP and to call her PCP, as we do not cover her PCP. Patient was in understanding and agreement.   Kuneff, Renee DO

## 2013-01-11 ENCOUNTER — Telehealth: Payer: Self-pay

## 2013-01-11 NOTE — Telephone Encounter (Signed)
Patient and her husband called.  Patient had a fall and is in increased pain.  She says she called today to get an appointment but she was told there were no appointments for 2 weeks.  Spoke with patient and she will be seen by Clydie Braun 01/12/2013 at 1030.

## 2013-01-12 ENCOUNTER — Encounter
Payer: BC Managed Care – PPO | Attending: Physical Medicine & Rehabilitation | Admitting: Physical Medicine and Rehabilitation

## 2013-01-12 ENCOUNTER — Encounter: Payer: Self-pay | Admitting: Physical Medicine and Rehabilitation

## 2013-01-12 ENCOUNTER — Other Ambulatory Visit: Payer: Self-pay | Admitting: Physical Medicine and Rehabilitation

## 2013-01-12 ENCOUNTER — Ambulatory Visit (HOSPITAL_COMMUNITY)
Admission: RE | Admit: 2013-01-12 | Discharge: 2013-01-12 | Disposition: A | Discharge status: Home / Self Care | Payer: BC Managed Care – PPO | Source: Ambulatory Visit | Attending: Physical Medicine and Rehabilitation | Admitting: Physical Medicine and Rehabilitation

## 2013-01-12 VITALS — BP 114/72 | HR 86 | Resp 14 | Ht 63.0 in | Wt 136.0 lb

## 2013-01-12 DIAGNOSIS — M546 Pain in thoracic spine: Secondary | ICD-10-CM

## 2013-01-12 DIAGNOSIS — M545 Low back pain, unspecified: Secondary | ICD-10-CM | POA: Insufficient documentation

## 2013-01-12 DIAGNOSIS — M4120 Other idiopathic scoliosis, site unspecified: Secondary | ICD-10-CM | POA: Insufficient documentation

## 2013-01-12 MED ORDER — METHYLPREDNISOLONE (PAK) 4 MG PO TABS: ORAL_TABLET | ORAL | Status: DC

## 2013-01-12 MED ORDER — MELOXICAM 15 MG PO TABS: 15.0000 mg | ORAL_TABLET | Freq: Every day | ORAL | Status: DC

## 2013-01-12 NOTE — Patient Instructions (Signed)
Start the Mobic AFTER you have finished the Dose pack. You can try ice or heat on your back, and try to rest, with your legs elevated

## 2013-01-12 NOTE — Progress Notes (Signed)
Subjective:    Patient ID: Jessica Horn, female    DOB: [birth year], 32 y.o.   MRN: [id number]  HPI The patient is a 32 year old female, who presents with an exacerbation of her LBP, after falling/slipping down her stairs 5 days ago .  The patient complains about severe pain in her mid L-spine and low T-spine. Patient denies any radiating symptoms.  She describes the pain as constant and sharp . Applying heat, taking medications , changing positions alleviate the symptoms. Prolonged standing, or standing up straight  aggrevates the symptoms. The patient grades his pain as a  10/10. Pain Inventory Average Pain 10 Pain Right Now 10 My pain is constant, sharp and stabbing  In the last 24 hours, has pain interfered with the following? General activity 10 Relation with others 10 Enjoyment of life 10 What TIME of day is your pain at its worst? all day Sleep (in general) Poor  Pain is worse with: walking, bending, sitting, inactivity, standing and some activites Pain improves with: heat/ice and medication Relief from Meds: 2  Mobility walk with assistance use a walker how many minutes can you walk? 0 ability to climb steps?  no do you drive?  no needs help with transfers  Function disabled: date disabled na I need assistance with the following:  dressing, bathing, toileting, meal prep, household duties and shopping  Neuro/Psych weakness trouble walking  Prior Studies Any changes since last visit?  no  Physicians involved in your care Any changes since last visit?  no   Family History  Problem Relation Age of Onset  . Hypertension Mother   . Anemia Mother   . Cancer Maternal Aunt     breast  . Kidney disease Maternal Grandmother   . Diabetes Maternal Grandmother   . Diabetes Paternal Grandfather   . Lupus Other   . Multiple sclerosis Other    History   Social History  . Marital Status: Married    Spouse Name: N/A    Number of Children: N/A  . Years of  Education: N/A   Social History Main Topics  . Smoking status: Never Smoker   . Smokeless tobacco: None  . Alcohol Use: 0.6 oz/week    1 Glasses of wine per week  . Drug Use: 2.00 per week    Special: Marijuana  . Sexual Activity: None   Other Topics Concern  . None   Social History Narrative  . None   Past Surgical History  Procedure Laterality Date  . Tubal ligation     Past Medical History  Diagnosis Date  . Fibromyalgia   . Back pain   . Cancer     breast CA at 32 y/o   BP 114/72  Pulse 86  Resp 14  Ht 5\' 3"  (1.6 m)  Wt 136 lb (61.689 kg)  BMI 24.1 kg/m2  SpO2 100%  LMP 12/20/2012     Review of Systems  Musculoskeletal: Positive for gait problem.  Neurological: Positive for weakness.       Objective:   Physical Exam  Constitutional: She is oriented to person, place, and time. She appears well-developed and well-nourished.  Antalgic gait with a cane  HENT:  Head: Normocephalic.  Neck: Neck supple.  Musculoskeletal: She exhibits tenderness.  Neurological: She is alert and oriented to person, place, and time.  Skin: Skin is warm and dry.  Psychiatric: She has a normal mood and affect.  Extreme tenderness on just barely touching the skin of her  entire back.  Symmetric normal motor tone is noted throughout. Normal muscle bulk. Muscle testing reveals 5/5 muscle strength of the upper extremity, and 5/5 of the lower extremity. Full range of motion in upper and lower extremities. ROM of spine is restricted into extension, due to pain. Some positive Waddell signs. Fine motor movements are normal in both hands. Sensory is intact and symmetric to light touch, pinprick and proprioception. DTR in the upper and lower extremity are present and symmetric 2+. No clonus is noted.  Patient arises from chair with difficulty. Narrow based gait with a cane, leaning heavily on the cane, kyphotic posture .  SLR negative       Assessment & Plan:  1. Chronic  cervico-thoracic axial spine pain. Origin could be multifactorial given her history, although recent xrays were unremarkable. This appears generally myofascial in nature with her posture/scoliosis also playing a role. MRIs of C-and T-spine, and brain without significant findings  2. Gait disorder with tremor and proprioceptive loss, not today  3. Depression with anxiety (PTSD?)  4. Exacerbation of LBP and thoracic pain after a fall, ordered X-rays of t- and L-spine, they were without significant findings, just mild scoliosis. 5. Hx of breast cancer rx'ed in 2004? With CTX and XRT  Plan:  1. Prescribed a medrol dose pack, and when finished Mobic for exacerbation, also prescribed Robaxin, for muscle spasms/pain, told patient to start the Mobic AFTER she finished the dose pack. Also recommended heat or ice and rest for the next 5 days, and then start again slowly with her exercise and walking program as pain permits. 2. Provided her with norco for pain control 5/325 one q6prn #120. UDS was consistent  3. Follow up with [facility] , consider 4. Hold on outpt PT for thoracic, lumbar ROM, posture, myofascial assessment, modailies, HEP until she meets deductible. HEP for now  5. Follow up with PA in one month. 25 min of face to face patient care time was spent during this visit, also her husband was on the phone and had some questions. All questions were encouraged and answered.  6. Fentanyl patch was refilled.  17. Oncology appt at [facility] for follow up. It has been at least 7 years since she has seen an oncologist, and she would like establish follow up here in [place].

## 2013-01-14 ENCOUNTER — Telehealth: Payer: Self-pay

## 2013-01-14 NOTE — Telephone Encounter (Signed)
She can try melantonin OTC

## 2013-01-14 NOTE — Telephone Encounter (Signed)
Patient called complaining of trouble sleeping.  She is in pain and would like sleep to help heal.  Please advise.  Left message informing patient we received her request and it has been processed.  In the voicemail she left she was unhappy that it had taken to long for someone to get back to her.

## 2013-01-14 NOTE — Telephone Encounter (Signed)
Message copied by Judd Gaudier on Fri Jan 14, 2013  9:56 AM ------      Message from: Su Monks      Created: Thu Jan 13, 2013 11:10 AM       Please notify patient, x-rays of T- and L-spine are without significant findings, NO fractures, she might have bruised soft tissue, which can be painful for about 6 weeks ------

## 2013-01-14 NOTE — Telephone Encounter (Signed)
Patient informed of x-ray results. 

## 2013-01-17 NOTE — Telephone Encounter (Signed)
Left message for patient to call office regarding sleeping issues.

## 2013-01-17 NOTE — Telephone Encounter (Signed)
She might have trouble sleeping because she was on a dose pack for the last 5 days, she should be able to sleep better after finishing the dose pack. If she still has trouble sleeping she might has to talk to her PCP about this.

## 2013-01-17 NOTE — Telephone Encounter (Signed)
Patient says she has tried melatonin but it has not helped.  She would like other suggestions.

## 2013-01-18 ENCOUNTER — Inpatient Hospital Stay (EMERGENCY_DEPARTMENT_HOSPITAL)
Admission: AD | Admit: 2013-01-18 | Discharge: 2013-01-18 | Disposition: A | Discharge status: Home / Self Care | Payer: BC Managed Care – PPO | Source: Ambulatory Visit | Attending: Family Medicine | Admitting: Family Medicine

## 2013-01-18 ENCOUNTER — Emergency Department (HOSPITAL_COMMUNITY)
Admission: EM | Admit: 2013-01-18 | Discharge: 2013-01-18 | Disposition: A | Discharge status: Home / Self Care | Payer: BC Managed Care – PPO | Attending: Emergency Medicine | Admitting: Emergency Medicine

## 2013-01-18 ENCOUNTER — Encounter (HOSPITAL_COMMUNITY): Payer: Self-pay | Admitting: *Deleted

## 2013-01-18 ENCOUNTER — Emergency Department (HOSPITAL_COMMUNITY): Payer: BC Managed Care – PPO

## 2013-01-18 DIAGNOSIS — N83209 Unspecified ovarian cyst, unspecified side: Secondary | ICD-10-CM | POA: Insufficient documentation

## 2013-01-18 DIAGNOSIS — N838 Other noninflammatory disorders of ovary, fallopian tube and broad ligament: Secondary | ICD-10-CM

## 2013-01-18 DIAGNOSIS — N83201 Unspecified ovarian cyst, right side: Secondary | ICD-10-CM

## 2013-01-18 DIAGNOSIS — R1032 Left lower quadrant pain: Secondary | ICD-10-CM | POA: Insufficient documentation

## 2013-01-18 DIAGNOSIS — Z791 Long term (current) use of non-steroidal anti-inflammatories (NSAID): Secondary | ICD-10-CM | POA: Insufficient documentation

## 2013-01-18 DIAGNOSIS — Z853 Personal history of malignant neoplasm of breast: Secondary | ICD-10-CM | POA: Insufficient documentation

## 2013-01-18 DIAGNOSIS — Z8739 Personal history of other diseases of the musculoskeletal system and connective tissue: Secondary | ICD-10-CM | POA: Insufficient documentation

## 2013-01-18 DIAGNOSIS — R109 Unspecified abdominal pain: Secondary | ICD-10-CM

## 2013-01-18 DIAGNOSIS — Z3202 Encounter for pregnancy test, result negative: Secondary | ICD-10-CM | POA: Insufficient documentation

## 2013-01-18 HISTORY — DX: Unspecified ovarian cyst, unspecified side: N83.209

## 2013-01-18 HISTORY — DX: Malignant neoplasm of vertebral column: C41.2

## 2013-01-18 LAB — CBC WITH DIFFERENTIAL/PLATELET
Basophils Relative: 0 % (ref 0–1)
Hemoglobin: 11.8 g/dL — ABNORMAL LOW (ref 12.0–15.0)
Lymphocytes Relative: 45 % (ref 12–46)
MCHC: 33.5 g/dL (ref 30.0–36.0)
Monocytes Relative: 8 % (ref 3–12)
Neutro Abs: 4.3 10*3/uL (ref 1.7–7.7)
Neutrophils Relative %: 46 % (ref 43–77)
RBC: 4.11 MIL/uL (ref 3.87–5.11)
WBC: 9.3 10*3/uL (ref 4.0–10.5)

## 2013-01-18 LAB — COMPREHENSIVE METABOLIC PANEL
AST: 12 U/L (ref 0–37)
Albumin: 3.9 g/dL (ref 3.5–5.2)
Alkaline Phosphatase: 55 U/L (ref 39–117)
BUN: 13 mg/dL (ref 6–23)
CO2: 25 mEq/L (ref 19–32)
Chloride: 102 mEq/L (ref 96–112)
Potassium: 4 mEq/L (ref 3.5–5.1)
Total Bilirubin: 0.1 mg/dL — ABNORMAL LOW (ref 0.3–1.2)

## 2013-01-18 LAB — URINALYSIS, ROUTINE W REFLEX MICROSCOPIC
Bilirubin Urine: NEGATIVE
Hgb urine dipstick: NEGATIVE
Nitrite: NEGATIVE
Protein, ur: NEGATIVE mg/dL
Urobilinogen, UA: 0.2 mg/dL (ref 0.0–1.0)

## 2013-01-18 LAB — PREGNANCY, URINE: Preg Test, Ur: NEGATIVE

## 2013-01-18 MED ORDER — CYCLOBENZAPRINE HCL 10 MG PO TABS: 10.0000 mg | ORAL_TABLET | Freq: Two times a day (BID) | ORAL | Status: DC | PRN

## 2013-01-18 MED ORDER — HYDROMORPHONE HCL PF 1 MG/ML IJ SOLN
1.0000 mg | Freq: Once | INTRAMUSCULAR | Status: AC
  Administered 2013-01-18: 1 mg via INTRAVENOUS
  Filled 2013-01-18: qty 1

## 2013-01-18 MED ORDER — ONDANSETRON HCL 4 MG/2ML IJ SOLN
4.0000 mg | Freq: Once | INTRAMUSCULAR | Status: AC
  Administered 2013-01-18: 4 mg via INTRAVENOUS
  Filled 2013-01-18: qty 2

## 2013-01-18 MED ORDER — SODIUM CHLORIDE 0.9 % IV BOLUS (SEPSIS)
1000.0000 mL | Freq: Once | INTRAVENOUS | Status: AC
  Administered 2013-01-18: 1000 mL via INTRAVENOUS

## 2013-01-18 MED ORDER — KETOROLAC TROMETHAMINE 10 MG PO TABS: 10.0000 mg | ORAL_TABLET | Freq: Four times a day (QID) | ORAL | Status: DC | PRN

## 2013-01-18 MED ORDER — KETOROLAC TROMETHAMINE 60 MG/2ML IM SOLN
60.0000 mg | Freq: Once | INTRAMUSCULAR | Status: AC
  Administered 2013-01-18: 60 mg via INTRAMUSCULAR
  Filled 2013-01-18: qty 2

## 2013-01-18 NOTE — MAU Note (Signed)
Patient is in stating that she just left [place] emergency room and was [facility] and manage her pain better. Patient states that her discharge instruction was to call a number which she did and the office is closed. She states that she could not go home (she states that she lived away). She walks with cane shuffle like gait.

## 2013-01-18 NOTE — ED Notes (Signed)
Pt is here with one month of lower abdominal pain and has history of ovarian cysts.  No vomiting or diarrhea.  LMP now

## 2013-01-18 NOTE — ED Notes (Signed)
Patient states she's been having LLQ pain x 1 month.   Patient states she thinks its ovarian cyst, it feels like it did before.

## 2013-01-18 NOTE — ED Notes (Signed)
Patient ambulated to restroom. Patient tolerated well.

## 2013-01-18 NOTE — ED Provider Notes (Signed)
CSN: [id number]     Arrival date & time 01/18/13  1035 History   First MD Initiated Contact with Patient 01/18/13 1038     Chief Complaint  Patient presents with  . Abdominal Pain   (Consider location/radiation/quality/duration/timing/severity/associated sxs/prior Treatment) HPI  Junior Zenovia Jordan is a 32 year old female with a past medical history of fibromyalgia, back pain, breast cancer, and ovarian cysts who presents emergency Department with chief complaint of left lower quadrant abdominal pain.  The patient complained that she had left lower quadrant abdominal pain for the past month.  Over the past day and a half she has had acutely worsening pain that is uncontrolled by her normal pain medications which is Duragesic and Norco.  The patient denies any vaginal discharge or odor.  She is currently menstruating.  Past Medical History  Diagnosis Date  . Fibromyalgia   . Back pain   . Cancer     breast CA at 32 y/o  . Ovarian cyst    Past Surgical History  Procedure Laterality Date  . Tubal ligation     Family History  Problem Relation Age of Onset  . Hypertension Mother   . Anemia Mother   . Cancer Maternal Aunt     breast  . Kidney disease Maternal Grandmother   . Diabetes Maternal Grandmother   . Diabetes Paternal Grandfather   . Lupus Other   . Multiple sclerosis Other    History  Substance Use Topics  . Smoking status: Never Smoker   . Smokeless tobacco: Not on file  . Alcohol Use: 0.6 oz/week    1 Glasses of wine per week   OB History   Grav Para Term Preterm Abortions TAB SAB Ect Mult Living                 Review of Systems  Constitutional: Negative for fever and chills.  HENT: Negative for trouble swallowing.   Respiratory: Negative for shortness of breath.   Cardiovascular: Negative for chest pain.  Gastrointestinal: Positive for abdominal pain. Negative for nausea, vomiting, diarrhea and constipation.  Genitourinary: Negative for dysuria and  hematuria.  Musculoskeletal: Negative for myalgias and arthralgias.  Skin: Negative for rash.  Neurological: Negative for numbness.  All other systems reviewed and are negative.    Allergies  Review of patient's allergies indicates no known allergies.  Home Medications   Current Outpatient Rx  Name  Route  Sig  Dispense  Refill  . fentaNYL (DURAGESIC - DOSED MCG/HR) 12 MCG/HR   Transdermal   Place 1 patch (12.5 mcg total) onto the skin every 3 (three) days.   10 patch   0   . HYDROcodone-acetaminophen (NORCO/VICODIN) 5-325 MG per tablet   Oral   Take 1 tablet by mouth every 6 (six) hours as needed for pain.   120 tablet   0   . methocarbamol (ROBAXIN) 500 MG tablet      TAKE ONE TABLET BY MOUTH 4 TIMES DAILY AS NEEDED   120 tablet   0   . methylPREDNIsolone (MEDROL DOSPACK) 4 MG tablet      follow package directions   21 tablet   0   . meloxicam (MOBIC) 15 MG tablet   Oral   Take 1 tablet (15 mg total) by mouth daily.   30 tablet   1    BP 128/92  Pulse 75  Temp(Src) 98.7 F (37.1 C) (Oral)  Resp 18  SpO2 100%  LMP 12/18/2012 Physical Exam  Nursing note  and vitals reviewed. Constitutional: She is oriented to person, place, and time. She appears well-developed and well-nourished. No distress.  Appears uncomfortable  HENT:  Head: Normocephalic and atraumatic.  Eyes: Conjunctivae are normal. No scleral icterus.  Neck: Normal range of motion.  Cardiovascular: Normal rate, regular rhythm and normal heart sounds.  Exam reveals no gallop and no friction rub.   No murmur heard. Pulmonary/Chest: Effort normal and breath sounds normal. No respiratory distress.  Abdominal: Soft. Bowel sounds are normal. She exhibits no distension and no mass. There is tenderness (LLQ). There is no guarding.  Genitourinary:  Pelvic exam: normal external genitalia, vulva, vagina, cervix, uterus and adnexa. TTP throughout exam.. No CMT    Neurological: She is alert and oriented  to person, place, and time.  Skin: Skin is warm and dry. She is not diaphoretic.    ED Course  Procedures (including critical care time) Labs Review Labs Reviewed  CBC WITH DIFFERENTIAL - Abnormal; Notable for the following:    Hemoglobin 11.8 (*)    HCT 35.2 (*)    Lymphs Abs 4.2 (*)    All other components within normal limits  COMPREHENSIVE METABOLIC PANEL - Abnormal; Notable for the following:    Total Bilirubin 0.1 (*)    All other components within normal limits  WET PREP, GENITAL  GC/CHLAMYDIA PROBE AMP  PREGNANCY, URINE  URINALYSIS, ROUTINE W REFLEX MICROSCOPIC   Imaging Review US Transvaginal Non-ob  01/18/2013   *RADIOLOGY REPORT*  Clinical Data:  Left lower quadrant pain  TRANSABDOMINAL AND TRANSVAGINAL ULTRASOUND OF PELVIS DOPPLER ULTRASOUND OF OVARIES  Technique:  Both transabdominal and transvaginal ultrasound examinations of the pelvis were performed. Transabdominal technique was performed for global imaging of the pelvis including uterus, ovaries, adnexal regions, and pelvic cul-de-sac.  It was necessary to proceed with endovaginal exam following the transabdominal exam to visualize the ovaries.  Color and duplex Doppler ultrasound was utilized to evaluate blood flow to the ovaries.  Comparison:  None.  FINDINGS  Uterus:  9.5 x 4.1 x 5.0 cm.  No myometrial masses.  Endometrium:  8.5 mm in thickness and uniform.  Right ovary: 3.0 x 2.3 x 2.0 cm.  The right ovary contains a complex heterogeneous cystic lesion measuring 1.2 cm.Normal arterial and venous wave forms are documented in the right ovary.  Left ovary: Normal ovarian tissue within the left adnexa measures 3.0 x 2.2 x 2.7 cm.  There is a 2.3 x 3.0 x 3.0 cm mass adjacent to and inseparable from the left ovary that appears to be a pedunculated left ovarian mass.  There is no definite color flow within the mass.  Normal arterial and venous Doppler wave forms are demonstrated in the left ovary.  Pulsed Doppler evaluation  demonstrates normal low-resistance arterial and venous waveforms in both ovaries.  Additional findings:  Trace free fluid.  IMPRESSION:  No evidence of ovarian torsion.  1.2 cm complex cystic lesion in the right ovary.  This is indeterminate that can be followed in 6 weeks to ensure resolution as it may simply represent a hemorrhagic cyst.  3.0 x 3.0 x 2.3 cm lesion attached to the left ovary as described above.  This may represent a benign lesion such as a dermoid however it is indeterminate.  Consider MRI or surgical consultation for further evaluation.   Original Report Authenticated By: Jolaine Click, M.D.   US Pelvis Complete  01/18/2013   *RADIOLOGY REPORT*  Clinical Data:  Left lower quadrant pain  TRANSABDOMINAL AND TRANSVAGINAL  ULTRASOUND OF PELVIS DOPPLER ULTRASOUND OF OVARIES  Technique:  Both transabdominal and transvaginal ultrasound examinations of the pelvis were performed. Transabdominal technique was performed for global imaging of the pelvis including uterus, ovaries, adnexal regions, and pelvic cul-de-sac.  It was necessary to proceed with endovaginal exam following the transabdominal exam to visualize the ovaries.  Color and duplex Doppler ultrasound was utilized to evaluate blood flow to the ovaries.  Comparison:  None.  FINDINGS  Uterus:  9.5 x 4.1 x 5.0 cm.  No myometrial masses.  Endometrium:  8.5 mm in thickness and uniform.  Right ovary: 3.0 x 2.3 x 2.0 cm.  The right ovary contains a complex heterogeneous cystic lesion measuring 1.2 cm.Normal arterial and venous wave forms are documented in the right ovary.  Left ovary: Normal ovarian tissue within the left adnexa measures 3.0 x 2.2 x 2.7 cm.  There is a 2.3 x 3.0 x 3.0 cm mass adjacent to and inseparable from the left ovary that appears to be a pedunculated left ovarian mass.  There is no definite color flow within the mass.  Normal arterial and venous Doppler wave forms are demonstrated in the left ovary.  Pulsed Doppler evaluation  demonstrates normal low-resistance arterial and venous waveforms in both ovaries.  Additional findings:  Trace free fluid.  IMPRESSION:  No evidence of ovarian torsion.  1.2 cm complex cystic lesion in the right ovary.  This is indeterminate that can be followed in 6 weeks to ensure resolution as it may simply represent a hemorrhagic cyst.  3.0 x 3.0 x 2.3 cm lesion attached to the left ovary as described above.  This may represent a benign lesion such as a dermoid however it is indeterminate.  Consider MRI or surgical consultation for further evaluation.   Original Report Authenticated By: Jolaine Click, M.D.   [place] Art/ven Flow Abd Pelv Doppler  01/18/2013   *RADIOLOGY REPORT*  Clinical Data:  Left lower quadrant pain  TRANSABDOMINAL AND TRANSVAGINAL ULTRASOUND OF PELVIS DOPPLER ULTRASOUND OF OVARIES  Technique:  Both transabdominal and transvaginal ultrasound examinations of the pelvis were performed. Transabdominal technique was performed for global imaging of the pelvis including uterus, ovaries, adnexal regions, and pelvic cul-de-sac.  It was necessary to proceed with endovaginal exam following the transabdominal exam to visualize the ovaries.  Color and duplex Doppler ultrasound was utilized to evaluate blood flow to the ovaries.  Comparison:  None.  FINDINGS  Uterus:  9.5 x 4.1 x 5.0 cm.  No myometrial masses.  Endometrium:  8.5 mm in thickness and uniform.  Right ovary: 3.0 x 2.3 x 2.0 cm.  The right ovary contains a complex heterogeneous cystic lesion measuring 1.2 cm.Normal arterial and venous wave forms are documented in the right ovary.  Left ovary: Normal ovarian tissue within the left adnexa measures 3.0 x 2.2 x 2.7 cm.  There is a 2.3 x 3.0 x 3.0 cm mass adjacent to and inseparable from the left ovary that appears to be a pedunculated left ovarian mass.  There is no definite color flow within the mass.  Normal arterial and venous Doppler wave forms are demonstrated in the left ovary.  Pulsed Doppler  evaluation demonstrates normal low-resistance arterial and venous waveforms in both ovaries.  Additional findings:  Trace free fluid.  IMPRESSION:  No evidence of ovarian torsion.  1.2 cm complex cystic lesion in the right ovary.  This is indeterminate that can be followed in 6 weeks to ensure resolution as it may simply represent a hemorrhagic  cyst.  3.0 x 3.0 x 2.3 cm lesion attached to the left ovary as described above.  This may represent a benign lesion such as a dermoid however it is indeterminate.  Consider MRI or surgical consultation for further evaluation.   Original Report Authenticated By: Jolaine Click, M.D.    MDM   1. Ovarian mass   2. Abdominal pain    Patient with cystic lesion of R ovary and pedunculated ovarian mass on the Left ovary where she is having pain. Patient also has a history of Breast cancer. I spoke with Dr. Jolayne Panther at OB/GYN who does not feel the patient needs emergent MRI but should follow up with them at the [facility]. I cannot prescribe pain medications as the patient is under pain contract, but I have urged the patient to call and see if  Her prescribing physicians can adjust her medications for a short time.  Patient is concerned about her pain and going home, however she has no emergent issues that need further workup and does not meet admission criteria. She also appears well and is joking and smiling intermittently. I did review the results of the [place] thoroughly with the patient. She is urged to go to [facility] hospital ED if she has further complications after discharge.    Arthor Captain, PA-C 01/19/13 2137

## 2013-01-18 NOTE — MAU Provider Note (Signed)
History     CSN: [id number]  Arrival date and time: 01/18/13 1710   First Provider Initiated Contact with Patient 01/18/13 1748      Chief Complaint  Patient presents with  . Abdominal Pain   HPI  [name] is a 32 y.o. non-pregnant female who presents to MAU essentially for a second opinion. She was seen at [facility] today for pelvic pain and diagnosed with ovarian cysts. She was told to follow up in the [facility] clinic. She called the clinic this evening and they were closed; she proceeded to MAU for pain management and information on how she can be seen quicker in the clinic. She describes the pain as sharp, left lower quadrant, that comes and goes; she rates it 10/10. She has a history of chronic back pain and is currently under a pain contract with her PCP. She is concerned about taking further medication in fear that it will violate her contract.   OB History   Grav Para Term Preterm Abortions TAB SAB Ect Mult Living   3 1  1 2  2   1       Past Medical History  Diagnosis Date  . Fibromyalgia   . Back pain   . Ovarian cyst   . Cancer     breast CA at 32 y/o  . Cancer of spine     spine CA for past 10 years    Past Surgical History  Procedure Laterality Date  . Tubal ligation    . Cesarean section    . Ovarian cyst removal      Family History  Problem Relation Age of Onset  . Hypertension Mother   . Anemia Mother   . Cancer Maternal Aunt     breast  . Kidney disease Maternal Grandmother   . Diabetes Maternal Grandmother   . Diabetes Paternal Grandfather   . Lupus Other   . Multiple sclerosis Other     History  Substance Use Topics  . Smoking status: Never Smoker   . Smokeless tobacco: Not on file  . Alcohol Use: 0.6 oz/week    1 Glasses of wine per week    Allergies: No Known Allergies  Prescriptions prior to admission  Medication Sig Dispense Refill  . fentaNYL (DURAGESIC - DOSED MCG/HR) 12 MCG/HR Place 1 patch (12.5 mcg total) onto  the skin every 3 (three) days.  10 patch  0  . HYDROcodone-acetaminophen (NORCO/VICODIN) 5-325 MG per tablet Take 1 tablet by mouth every 6 (six) hours as needed for pain.  120 tablet  0  . meloxicam (MOBIC) 15 MG tablet Take 1 tablet (15 mg total) by mouth daily.  30 tablet  1  . methocarbamol (ROBAXIN) 500 MG tablet TAKE ONE TABLET BY MOUTH 4 TIMES DAILY AS NEEDED  120 tablet  0  . methylPREDNIsolone (MEDROL DOSPACK) 4 MG tablet follow package directions  21 tablet  0    Labs done prior to arrival to MAU   Results for orders placed during the hospital encounter of 01/18/13 (from the past 24 hour(s))  CBC WITH DIFFERENTIAL     Status: Abnormal   Collection Time    01/18/13 11:04 AM      Result Value Range   WBC 9.3  4.0 - 10.5 K/uL   RBC 4.11  3.87 - 5.11 MIL/uL   Hemoglobin 11.8 (*) 12.0 - 15.0 g/dL   HCT 40.9 (*) 81.1 - 91.4 %   MCV 85.6  78.0 -  100.0 fL   MCH 28.7  26.0 - 34.0 pg   MCHC 33.5  30.0 - 36.0 g/dL   RDW 54.0  98.1 - 19.1 %   Platelets 301  150 - 400 K/uL   Neutrophils Relative % 46  43 - 77 %   Neutro Abs 4.3  1.7 - 7.7 K/uL   Lymphocytes Relative 45  12 - 46 %   Lymphs Abs 4.2 (*) 0.7 - 4.0 K/uL   Monocytes Relative 8  3 - 12 %   Monocytes Absolute 0.7  0.1 - 1.0 K/uL   Eosinophils Relative 1  0 - 5 %   Eosinophils Absolute 0.1  0.0 - 0.7 K/uL   Basophils Relative 0  0 - 1 %   Basophils Absolute 0.0  0.0 - 0.1 K/uL  COMPREHENSIVE METABOLIC PANEL     Status: Abnormal   Collection Time    01/18/13 11:04 AM      Result Value Range   Sodium 138  135 - 145 mEq/L   Potassium 4.0  3.5 - 5.1 mEq/L   Chloride 102  96 - 112 mEq/L   CO2 25  19 - 32 mEq/L   Glucose, Bld 85  70 - 99 mg/dL   BUN 13  6 - 23 mg/dL   Creatinine, Ser 4.78  0.50 - 1.10 mg/dL   Calcium 9.4  8.4 - 29.5 mg/dL   Total Protein 7.2  6.0 - 8.3 g/dL   Albumin 3.9  3.5 - 5.2 g/dL   AST 12  0 - 37 U/L   ALT 6  0 - 35 U/L   Alkaline Phosphatase 55  39 - 117 U/L   Total Bilirubin 0.1 (*) 0.3 - 1.2  mg/dL   GFR calc non Af Amer >90  >90 mL/min   GFR calc Af Amer >90  >90 mL/min  PREGNANCY, URINE     Status: None   Collection Time    01/18/13 11:29 AM      Result Value Range   Preg Test, Ur NEGATIVE  NEGATIVE  URINALYSIS, ROUTINE W REFLEX MICROSCOPIC     Status: None   Collection Time    01/18/13 11:29 AM      Result Value Range   Color, Urine YELLOW  YELLOW   APPearance CLEAR  CLEAR   Specific Gravity, Urine 1.015  1.005 - 1.030   pH 6.5  5.0 - 8.0   Glucose, UA NEGATIVE  NEGATIVE mg/dL   Hgb urine dipstick NEGATIVE  NEGATIVE   Bilirubin Urine NEGATIVE  NEGATIVE   Ketones, ur NEGATIVE  NEGATIVE mg/dL   Protein, ur NEGATIVE  NEGATIVE mg/dL   Urobilinogen, UA 0.2  0.0 - 1.0 mg/dL   Nitrite NEGATIVE  NEGATIVE   Leukocytes, UA NEGATIVE  NEGATIVE  WET PREP, GENITAL     Status: None   Collection Time    01/18/13  3:19 PM      Result Value Range   Yeast Wet Prep HPF POC NONE SEEN  NONE SEEN   Trich, Wet Prep NONE SEEN  NONE SEEN   Clue Cells Wet Prep HPF POC NONE SEEN  NONE SEEN   WBC, Wet Prep HPF POC NONE SEEN  NONE SEEN   US Transvaginal Non-ob  01/18/2013   *RADIOLOGY REPORT*  Clinical Data:  Left lower quadrant pain  TRANSABDOMINAL AND TRANSVAGINAL ULTRASOUND OF PELVIS DOPPLER ULTRASOUND OF OVARIES  Technique:  Both transabdominal and transvaginal ultrasound examinations of the pelvis were performed. Transabdominal technique  was performed for global imaging of the pelvis including uterus, ovaries, adnexal regions, and pelvic cul-de-sac.  It was necessary to proceed with endovaginal exam following the transabdominal exam to visualize the ovaries.  Color and duplex Doppler ultrasound was utilized to evaluate blood flow to the ovaries.  Comparison:  None.  FINDINGS  Uterus:  9.5 x 4.1 x 5.0 cm.  No myometrial masses.  Endometrium:  8.5 mm in thickness and uniform.  Right ovary: 3.0 x 2.3 x 2.0 cm.  The right ovary contains a complex heterogeneous cystic lesion measuring 1.2 cm.Normal  arterial and venous wave forms are documented in the right ovary.  Left ovary: Normal ovarian tissue within the left adnexa measures 3.0 x 2.2 x 2.7 cm.  There is a 2.3 x 3.0 x 3.0 cm mass adjacent to and inseparable from the left ovary that appears to be a pedunculated left ovarian mass.  There is no definite color flow within the mass.  Normal arterial and venous Doppler wave forms are demonstrated in the left ovary.  Pulsed Doppler evaluation demonstrates normal low-resistance arterial and venous waveforms in both ovaries.  Additional findings:  Trace free fluid.  IMPRESSION:  No evidence of ovarian torsion.  1.2 cm complex cystic lesion in the right ovary.  This is indeterminate that can be followed in 6 weeks to ensure resolution as it may simply represent a hemorrhagic cyst.  3.0 x 3.0 x 2.3 cm lesion attached to the left ovary as described above.  This may represent a benign lesion such as a dermoid however it is indeterminate.  Consider MRI or surgical consultation for further evaluation.   Original Report Authenticated By: Jolaine Click, M.D.   US Pelvis Complete  01/18/2013   *RADIOLOGY REPORT*  Clinical Data:  Left lower quadrant pain  TRANSABDOMINAL AND TRANSVAGINAL ULTRASOUND OF PELVIS DOPPLER ULTRASOUND OF OVARIES  Technique:  Both transabdominal and transvaginal ultrasound examinations of the pelvis were performed. Transabdominal technique was performed for global imaging of the pelvis including uterus, ovaries, adnexal regions, and pelvic cul-de-sac.  It was necessary to proceed with endovaginal exam following the transabdominal exam to visualize the ovaries.  Color and duplex Doppler ultrasound was utilized to evaluate blood flow to the ovaries.  Comparison:  None.  FINDINGS  Uterus:  9.5 x 4.1 x 5.0 cm.  No myometrial masses.  Endometrium:  8.5 mm in thickness and uniform.  Right ovary: 3.0 x 2.3 x 2.0 cm.  The right ovary contains a complex heterogeneous cystic lesion measuring 1.2 cm.Normal  arterial and venous wave forms are documented in the right ovary.  Left ovary: Normal ovarian tissue within the left adnexa measures 3.0 x 2.2 x 2.7 cm.  There is a 2.3 x 3.0 x 3.0 cm mass adjacent to and inseparable from the left ovary that appears to be a pedunculated left ovarian mass.  There is no definite color flow within the mass.  Normal arterial and venous Doppler wave forms are demonstrated in the left ovary.  Pulsed Doppler evaluation demonstrates normal low-resistance arterial and venous waveforms in both ovaries.  Additional findings:  Trace free fluid.  IMPRESSION:  No evidence of ovarian torsion.  1.2 cm complex cystic lesion in the right ovary.  This is indeterminate that can be followed in 6 weeks to ensure resolution as it may simply represent a hemorrhagic cyst.  3.0 x 3.0 x 2.3 cm lesion attached to the left ovary as described above.  This may represent a benign lesion such as  a dermoid however it is indeterminate.  Consider MRI or surgical consultation for further evaluation.   Original Report Authenticated By: Jolaine Click, M.D.   [place] Art/ven Flow Abd Pelv Doppler  01/18/2013   *RADIOLOGY REPORT*  Clinical Data:  Left lower quadrant pain  TRANSABDOMINAL AND TRANSVAGINAL ULTRASOUND OF PELVIS DOPPLER ULTRASOUND OF OVARIES  Technique:  Both transabdominal and transvaginal ultrasound examinations of the pelvis were performed. Transabdominal technique was performed for global imaging of the pelvis including uterus, ovaries, adnexal regions, and pelvic cul-de-sac.  It was necessary to proceed with endovaginal exam following the transabdominal exam to visualize the ovaries.  Color and duplex Doppler ultrasound was utilized to evaluate blood flow to the ovaries.  Comparison:  None.  FINDINGS  Uterus:  9.5 x 4.1 x 5.0 cm.  No myometrial masses.  Endometrium:  8.5 mm in thickness and uniform.  Right ovary: 3.0 x 2.3 x 2.0 cm.  The right ovary contains a complex heterogeneous cystic lesion measuring 1.2  cm.Normal arterial and venous wave forms are documented in the right ovary.  Left ovary: Normal ovarian tissue within the left adnexa measures 3.0 x 2.2 x 2.7 cm.  There is a 2.3 x 3.0 x 3.0 cm mass adjacent to and inseparable from the left ovary that appears to be a pedunculated left ovarian mass.  There is no definite color flow within the mass.  Normal arterial and venous Doppler wave forms are demonstrated in the left ovary.  Pulsed Doppler evaluation demonstrates normal low-resistance arterial and venous waveforms in both ovaries.  Additional findings:  Trace free fluid.  IMPRESSION:  No evidence of ovarian torsion.  1.2 cm complex cystic lesion in the right ovary.  This is indeterminate that can be followed in 6 weeks to ensure resolution as it may simply represent a hemorrhagic cyst.  3.0 x 3.0 x 2.3 cm lesion attached to the left ovary as described above.  This may represent a benign lesion such as a dermoid however it is indeterminate.  Consider MRI or surgical consultation for further evaluation.   Original Report Authenticated By: Jolaine Click, M.D.    Review of Systems  Constitutional: Negative for fever and chills.  Cardiovascular: Negative for chest pain.  Gastrointestinal: Positive for nausea and abdominal pain. Negative for vomiting, diarrhea and constipation.       Left, lower quadrant pain   Genitourinary: Negative for dysuria, urgency and frequency.  Neurological: Positive for weakness. Negative for dizziness.   Physical Exam   Blood pressure 113/71, pulse 68, temperature 99 F (37.2 C), temperature source Oral, resp. rate 18, height 5\' 3"  (1.6 m), weight 61.803 kg (136 lb 4 oz), last menstrual period 12/18/2012, SpO2 99.00%.  Physical Exam  Constitutional: She is oriented to person, place, and time. She appears well-developed and well-nourished. No distress.  Neck: Neck supple.  Respiratory: Effort normal.  GI: Soft. She exhibits no distension. There is tenderness. There is  guarding. There is no rebound.  Left lower quadrant tenderness on palpation   Genitourinary:  Speculum exam: deferred; full exam and cultures done at [facility] exam: Cervix closed; no cervical motion tenderness  Uterus non tender, normal size Left Adnexal tenderness Chaperone present for exam.   Neurological: She is alert and oriented to person, place, and time.  Skin: Skin is warm. She is diaphoretic.  Psychiatric: Her behavior is normal.    MAU Course  Procedures None  MDM Consulted with Dr. Shawnie Pons regarding follow up plan and pain management.  Bimanual exam done in MAU Toradol 60 mg IM times one dose in MAU    Assessment and Plan  A: Bilateral ovarian cysts  P: Discharge home Referral sent to the clinic RX: flexeril 10 mg BID PO PRN for pain (#10) no rf        Toradol 10 mg PO Q6 hours PRN for pain (#12) no rf Stop the robaxin while taking the flexeril  Take Toradol with food Return to MAU with worsening symptoms.  Support given    Venia Carbon IRENE FNP-C 01/18/2013, 6:31 PM

## 2013-01-18 NOTE — MAU Provider Note (Signed)
Chart reviewed and agree with management and plan.  

## 2013-01-19 ENCOUNTER — Telehealth: Payer: Self-pay

## 2013-01-19 LAB — GC/CHLAMYDIA PROBE AMP
CT Probe RNA: NEGATIVE
GC Probe RNA: NEGATIVE

## 2013-01-19 NOTE — Telephone Encounter (Signed)
Talked to Dr. Riley Kill,  We think that the pain medication she is on at this point should be sufficient for an ovarian cyst, she should follow up with her OBGYN !

## 2013-01-19 NOTE — Telephone Encounter (Signed)
Spoke with patient.  Advised her to follow up with PCP regarding abdominal pain.  She is requesting copy of controlled substance agreement.  The contract was signed but unable to acquire at this time.  Will have patient sign contract at next visit.  Patient was given Toradol and flexeril to take home from the hospital.  She wanted to be sure she could have these before she took them.  Per Clydie Braun it is ok for patient to take Toradol and flexeril.  Patient understands.

## 2013-01-19 NOTE — Telephone Encounter (Signed)
Also in the note they were only mentioning increase meds if necessary, NOT specifically change to Percocet, that was not mentioned.

## 2013-01-19 NOTE — Telephone Encounter (Signed)
Patient called to let us know she went to the ER for abdominal pain.  She was informed she has cyst and mass on both of her ovaries.  The hospital wanted the patient to contact the office to inquire about getting her pain medication changed from norco to percocet since she is in increased pain.  She did not get anything from the hospital so she would not break her controlled substance agreement.  Please advise.

## 2013-01-19 NOTE — Telephone Encounter (Signed)
Looked up her ED note found this : High Risk: Patient uses several variations of maiden and married names to obtain controlled substances! See phone notes for more details.   Could not find the mentioned phone note, please try to find out her maiden name etc, then I will look her up at the [facility] data.  Most likely I will not give her stronger narcotics, she is already on Fentanyl, she has a Hx of cysts for a period of time . Will decide after I looked her up

## 2013-01-20 NOTE — Telephone Encounter (Signed)
Ok, I also stated that she should NOT take the mobic when she is on the Toradol, because of the risk of potentially severe bleeding ! Also she should follow up with her OBGYN, not necessarily her PCP, for her ovarian cysts.

## 2013-01-20 NOTE — ED Provider Notes (Signed)
Medical screening examination/treatment/procedure(s) were performed by non-physician practitioner and as supervising physician I was immediately available for consultation/collaboration.   Candyce Churn, MD 01/20/13 [phone]

## 2013-01-21 ENCOUNTER — Ambulatory Visit (INDEPENDENT_AMBULATORY_CARE_PROVIDER_SITE_OTHER): Payer: BC Managed Care – PPO | Admitting: Obstetrics and Gynecology

## 2013-01-21 ENCOUNTER — Encounter: Payer: Self-pay | Admitting: Obstetrics and Gynecology

## 2013-01-21 VITALS — BP 106/76 | HR 96 | Temp 98.3°F | Ht 60.0 in | Wt 137.0 lb

## 2013-01-21 DIAGNOSIS — N83202 Unspecified ovarian cyst, left side: Secondary | ICD-10-CM

## 2013-01-21 DIAGNOSIS — N83209 Unspecified ovarian cyst, unspecified side: Secondary | ICD-10-CM

## 2013-01-21 MED ORDER — CYCLOBENZAPRINE HCL 10 MG PO TABS: 10.0000 mg | ORAL_TABLET | Freq: Two times a day (BID) | ORAL | Status: DC | PRN

## 2013-01-21 MED ORDER — KETOROLAC TROMETHAMINE 10 MG PO TABS: 10.0000 mg | ORAL_TABLET | Freq: Four times a day (QID) | ORAL | Status: DC | PRN

## 2013-01-21 NOTE — Progress Notes (Signed)
Subjective:    Patient ID: Jessica Horn, female    DOB: 1981-03-29, 32 y.o.   MRN: [id number]  HPI  32 yo [id number] presenting today as MAU follow up for evaluation of bilateral ovarian cyst. Patient was initially seen in [facility] ED secondary to LLQ pain. Patient reports persistent pelvic pain rating it a 5/10. Patient is under pain contract secondary to chronic back pain. Patient desires to have surgical intervention now!  Past Medical History  Diagnosis Date  . Fibromyalgia   . Back pain   . Ovarian cyst   . Cancer     breast CA at 32 y/o  . Cancer of spine     spine CA for past 10 years   Past Surgical History  Procedure Laterality Date  . Tubal ligation    . Cesarean section    . Ovarian cyst removal     Family History  Problem Relation Age of Onset  . Hypertension Mother   . Anemia Mother   . Cancer Maternal Aunt     breast  . Kidney disease Maternal Grandmother   . Diabetes Maternal Grandmother   . Diabetes Paternal Grandfather   . Lupus Other   . Multiple sclerosis Other    History  Substance Use Topics  . Smoking status: Never Smoker   . Smokeless tobacco: Not on file  . Alcohol Use: 0.6 oz/week    1 Glasses of wine per week     Review of Systems     Objective:   Physical Exam  GENERAL: Well-developed, well-nourished female in no acute distress.  ABDOMEN: Soft, nontender, nondistended. No organomegaly. PELVIC: Normal external female genitalia. Vagina is pink and rugated.  Normal discharge. Normal appearing cervix. Uterus is normal in size. No adnexal mass, tenderness in left lower quadrant EXTREMITIES: No cyanosis, clubbing, or edema, 2+ distal pulses.   US Pelvis Complete  01/18/2013 *RADIOLOGY REPORT* Clinical Data: Left lower quadrant pain TRANSABDOMINAL AND TRANSVAGINAL ULTRASOUND OF PELVIS DOPPLER ULTRASOUND OF OVARIES Technique: Both transabdominal and transvaginal ultrasound examinations of the pelvis were performed. Transabdominal technique  was performed for global imaging of the pelvis including uterus, ovaries, adnexal regions, and pelvic cul-de-sac. It was necessary to proceed with endovaginal exam following the transabdominal exam to visualize the ovaries. Color and duplex Doppler ultrasound was utilized to evaluate blood flow to the ovaries. Comparison: None. FINDINGS Uterus: 9.5 x 4.1 x 5.0 cm. No myometrial masses. Endometrium: 8.5 mm in thickness and uniform. Right ovary: 3.0 x 2.3 x 2.0 cm. The right ovary contains a complex heterogeneous cystic lesion measuring 1.2 cm.Normal arterial and venous wave forms are documented in the right ovary. Left ovary: Normal ovarian tissue within the left adnexa measures 3.0 x 2.2 x 2.7 cm. There is a 2.3 x 3.0 x 3.0 cm mass adjacent to and inseparable from the left ovary that appears to be a pedunculated left ovarian mass. There is no definite color flow within the mass. Normal arterial and venous Doppler wave forms are demonstrated in the left ovary. Pulsed Doppler evaluation demonstrates normal low-resistance arterial and venous waveforms in both ovaries. Additional findings: Trace free fluid. IMPRESSION: No evidence of ovarian torsion. 1.2 cm complex cystic lesion in the right ovary. This is indeterminate that can be followed in 6 weeks to ensure resolution as it may simply represent a hemorrhagic cyst. 3.0 x 3.0 x 2.3 cm lesion attached to the left ovary as described above. This may represent a benign lesion such as a dermoid however it  is indeterminate. Consider MRI or surgical consultation for further evaluation. Original Report Authenticated By: Jolaine Click, M.D.      Assessment & Plan:  32 yo with bilateral ovarian cyst - Discussed further imaging with MRI - Discussed surgical intervention options and timing of surgery dependent on availability with OR scheduler. Patient very unhappy as she desires to have the surgery immediately. She even mentioned presenting to the ED for surgical  intervention. Will try to schedule with any provider if possible - In the meantime, will refill the flexeril and toradol prescriptions which the patient states is helping relieve her pain.

## 2013-01-25 ENCOUNTER — Telehealth: Payer: Self-pay | Admitting: *Deleted

## 2013-01-25 NOTE — Telephone Encounter (Signed)
Pt left message stating that she has "cancelled her MRI appt because it was going to cost her $6000 out of pocket since her insurance only pays for 80%. And anyway you should already have all the information you need from my other tests and my surgery has been moved up."

## 2013-01-26 ENCOUNTER — Ambulatory Visit (INDEPENDENT_AMBULATORY_CARE_PROVIDER_SITE_OTHER): Payer: BC Managed Care – PPO | Admitting: Obstetrics and Gynecology

## 2013-01-26 ENCOUNTER — Encounter: Payer: Self-pay | Admitting: Obstetrics and Gynecology

## 2013-01-26 ENCOUNTER — Ambulatory Visit: Payer: BC Managed Care – PPO | Admitting: Physical Medicine & Rehabilitation

## 2013-01-26 DIAGNOSIS — N839 Noninflammatory disorder of ovary, fallopian tube and broad ligament, unspecified: Secondary | ICD-10-CM

## 2013-01-26 DIAGNOSIS — N838 Other noninflammatory disorders of ovary, fallopian tube and broad ligament: Secondary | ICD-10-CM

## 2013-01-26 NOTE — Telephone Encounter (Signed)
Patient is scheduled for MRI on 9/18. Can she be contacted to come in to our clinic to draw a CA-125 on that day as well.      Thanks      [employer]

## 2013-01-26 NOTE — Progress Notes (Signed)
I was present for the entire visit with Dr. Jolayne Panther, patient and patients daughter.  I agree with Dr. Louanna Raw assessment of the situation below.

## 2013-01-26 NOTE — Progress Notes (Signed)
Patient ID: Jessica Horn, female   DOB: 01-Sep-1980, 32 y.o.   MRN: [id number] I was informed by my office staff that the patient called and informed us that she was cancelling her MRI due to financial cost.  Patient was contacted this morning by phone to be seen in our [facility] office to discuss plan of care. The patient was counseled by me in the presence of Inetta Fermo, one of my office staff. The patient ambulated with the assistance of a cane to our examination room with her young daughter by her side, not appearing in much distress. She sat on the examination table with a notepad in hand, taking notes as I was talking.  I explained to the patient that the ultrasound was not able to adequately characterize her left ovarian mass and hence why the MRI was scheduled. The patient voiced understanding of that and stated that she didn't think it would matter since her surgery was scheduled for next week. I proceeded to explain to the patient that given her history of breast cancer in the past, I was concerned that this left ovarian cyst may represent metastatic breast cancer to the ovary (hence why the MRI was ordered). Again the patient did not understand the need for MRI if surgical intervention was planned. I explained to the patient that if the mass is suspicious for cancer, it is best that surgical intervention is performed by GYN ONC. I also told the patient that her case was reviewed with a GYN ONC (Dr. Selena Batten in [facility]) who agreed that she should be seen by a GYN ONC in [place]. At which point, I informed the patient that she is scheduled to be seen by Dr. Nelly Rout on 9/18 at 1:15 pm at [facility]. The patient inquired as to whether or not her surgery would be rescheduled and I informed her that it will be scheduled my GYN ONC and that she would be provided with that information tomorrow. I also discussed the need to obtain a CA-125. She questioned why it should be drawn from my office  if she is scheduled to be seen by a specialist tomorrow. I informed her that it would optimize her visit if all the information were available for review by the GYN ONC. Patient proceeded to question why her surgery needed to be postponed. I explained to her that if cancer is suspected, it is in her best interest to have the surgery performed by a cancer specialist in order to avoid the need for future surgical intervention. Patient stated that she would rather reschedule her MRI, keep her scheduled surgery date for next week and be scheduled for another surgical intervention at another time if that was needed. I informed her that I was not comfortable with that plan and it would not be the best medical care, hence why I made the referral to GYN ONC and advised her to keep that appointment.   Patient complained of pain which makes it hard for her to care for herself. I sympathized with her pain and her current situation and reminded her that she has been disabled and in need of assistance for quite some time. I also reminded her that in order to deliver the best medical care, some patience is needed and that no one is purposely delaying interventions. She accused me of having terrible bedside manners and not caring. She asked me questions regarding how long I have been in practice and whether I was board certified. I  informed her that this information can easily be obtained online or she can contact the administrative office.  The patient angrily left the office without taking her instructions after having her blood drawn.  A total of 20 minutes was spent with this patient

## 2013-01-27 ENCOUNTER — Ambulatory Visit: Payer: BC Managed Care – PPO | Admitting: Gynecologic Oncology

## 2013-01-27 ENCOUNTER — Telehealth: Payer: Self-pay | Admitting: Oncology

## 2013-01-27 ENCOUNTER — Ambulatory Visit: Payer: BC Managed Care – PPO | Attending: Gynecologic Oncology | Admitting: Gynecologic Oncology

## 2013-01-27 ENCOUNTER — Inpatient Hospital Stay (HOSPITAL_COMMUNITY): Admission: RE | Admit: 2013-01-27 | Payer: BC Managed Care – PPO | Source: Ambulatory Visit

## 2013-01-27 ENCOUNTER — Encounter: Payer: Self-pay | Admitting: Gynecologic Oncology

## 2013-01-27 ENCOUNTER — Other Ambulatory Visit (HOSPITAL_COMMUNITY)
Admission: RE | Admit: 2013-01-27 | Discharge: 2013-01-27 | Disposition: A | Discharge status: Home / Self Care | Payer: BC Managed Care – PPO | Source: Ambulatory Visit | Attending: Gynecologic Oncology | Admitting: Gynecologic Oncology

## 2013-01-27 ENCOUNTER — Ambulatory Visit (HOSPITAL_COMMUNITY): Admission: RE | Admit: 2013-01-27 | Payer: BC Managed Care – PPO | Source: Ambulatory Visit

## 2013-01-27 VITALS — BP 112/85 | HR 117 | Temp 99.6°F | Resp 18 | Ht 63.66 in | Wt 138.0 lb

## 2013-01-27 DIAGNOSIS — Z01419 Encounter for gynecological examination (general) (routine) without abnormal findings: Secondary | ICD-10-CM | POA: Insufficient documentation

## 2013-01-27 DIAGNOSIS — N839 Noninflammatory disorder of ovary, fallopian tube and broad ligament, unspecified: Secondary | ICD-10-CM | POA: Insufficient documentation

## 2013-01-27 DIAGNOSIS — R1032 Left lower quadrant pain: Secondary | ICD-10-CM | POA: Insufficient documentation

## 2013-01-27 DIAGNOSIS — N838 Other noninflammatory disorders of ovary, fallopian tube and broad ligament: Secondary | ICD-10-CM

## 2013-01-27 LAB — CA 125: CA 125: 7.3 U/mL (ref 0.0–30.2)

## 2013-01-27 NOTE — Patient Instructions (Signed)
F/U in 2 weeks F/U with Dr. Welton Flakes 01/28/2013 Ovarian Cyst The ovaries are small organs that are on each side of the uterus. The ovaries are the organs that produce the female hormones, estrogen and progesterone. An ovarian cyst is a sac filled with fluid that can vary in its size. It is normal for a small cyst to form in women who are in the childbearing age and who have menstrual periods. This type of cyst is called a follicle cyst that becomes an ovulation cyst (corpus luteum cyst) after it produces the women's egg. It later goes away on its own if the woman does not become pregnant. There are other kinds of ovarian cysts that may cause problems and may need to be treated. The most serious problem is a cyst with cancer. It should be noted that menopausal women who have an ovarian cyst are at a higher risk of it being a cancer cyst. They should be evaluated very quickly, thoroughly and followed closely. This is especially true in menopausal women because of the high rate of ovarian cancer in women in menopause. CAUSES AND TYPES OF OVARIAN CYSTS:  FUNCTIONAL CYST: The follicle/corpus luteum cyst is a functional cyst that occurs every month during ovulation with the menstrual cycle. They go away with the next menstrual cycle if the woman does not get pregnant. Usually, there are no symptoms with a functional cyst.  ENDOMETRIOMA CYST: This cyst develops from the lining of the uterus tissue. This cyst gets in or on the ovary. It grows every month from the bleeding during the menstrual period. It is also called a "chocolate cyst" because it becomes filled with blood that turns brown. This cyst can cause pain in the lower abdomen during intercourse and with your menstrual period.  CYSTADENOMA CYST: This cyst develops from the cells on the outside of the ovary. They usually are not cancerous. They can get very big and cause lower abdomen pain and pain with intercourse. This type of cyst can twist on itself, cut  off its blood supply and cause severe pain. It also can easily rupture and cause a lot of pain.  DERMOID CYST: This type of cyst is sometimes found in both ovaries. They are found to have different kinds of body tissue in the cyst. The tissue includes skin, teeth, hair, and/or cartilage. They usually do not have symptoms unless they get very big. Dermoid cysts are rarely cancerous.  POLYCYSTIC OVARY: This is a rare condition with hormone problems that produces many small cysts on both ovaries. The cysts are follicle-like cysts that never produce an egg and become a corpus luteum. It can cause an increase in body weight, infertility, acne, increase in body and facial hair and lack of menstrual periods or rare menstrual periods. Many women with this problem develop type 2 diabetes. The exact cause of this problem is unknown. A polycystic ovary is rarely cancerous.  THECA LUTEIN CYST: Occurs when too much hormone (human chorionic gonadotropin) is produced and over-stimulates the ovaries to produce an egg. They are frequently seen when doctors stimulate the ovaries for invitro-fertilization (test tube babies).  LUTEOMA CYST: This cyst is seen during pregnancy. Rarely it can cause an obstruction to the birth canal during labor and delivery. They usually go away after delivery. SYMPTOMS   Pelvic pain or pressure.  Pain during sexual intercourse.  Increasing girth (swelling) of the abdomen.  Abnormal menstrual periods.  Increasing pain with menstrual periods.  You stop having menstrual periods and  you are not pregnant. DIAGNOSIS  The diagnosis can be made during:  Routine or annual pelvic examination (common).  Ultrasound.  X-ray of the pelvis.  CT Scan.  MRI.  Blood tests. TREATMENT   Treatment may only be to follow the cyst monthly for 2 to 3 months with your caregiver. Many go away on their own, especially functional cysts.  May be aspirated (drained) with a long needle with  ultrasound, or by laparoscopy (inserting a tube into the pelvis through a small incision).  The whole cyst can be removed by laparoscopy.  Sometimes the cyst may need to be removed through an incision in the lower abdomen.  Hormone treatment is sometimes used to help dissolve certain cysts.  Birth control pills are sometimes used to help dissolve certain cysts. HOME CARE INSTRUCTIONS  Follow your caregiver's advice regarding:  Medicine.  Follow up visits to evaluate and treat the cyst.  You may need to come back or make an appointment with another caregiver, to find the exact cause of your cyst, if your caregiver is not a gynecologist.  Get your yearly and recommended pelvic examinations and Pap tests.  Let your caregiver know if you have had an ovarian cyst in the past. SEEK MEDICAL CARE IF:   Your periods are late, irregular, they stop, or are painful.  Your stomach (abdomen) or pelvic pain does not go away.  Your stomach becomes larger or swollen.  You have pressure on your bladder or trouble emptying your bladder completely.  You have painful sexual intercourse.  You have feelings of fullness, pressure, or discomfort in your stomach.  You lose weight for no apparent reason.  You feel generally ill.  You become constipated.  You lose your appetite.  You develop acne.  You have an increase in body and facial hair.  You are gaining weight, without changing your exercise and eating habits.  You think you are pregnant. SEEK IMMEDIATE MEDICAL CARE IF:   You have increasing abdominal pain.  You feel sick to your stomach (nausea) and/or vomit.  You develop a fever that comes on suddenly.  You develop abdominal pain during a bowel movement.  Your menstrual periods become heavier than usual. Document Released: 04/28/2005 Document Revised: 07/21/2011 Document Reviewed: 03/01/2009 [facility] Patient Information 2014 [place], [place].

## 2013-01-27 NOTE — Telephone Encounter (Signed)
C/D 01/27/13 for appt. 01/28/13

## 2013-01-28 ENCOUNTER — Encounter (HOSPITAL_COMMUNITY): Payer: Self-pay | Admitting: Pharmacy Technician

## 2013-01-28 ENCOUNTER — Encounter: Payer: Self-pay | Admitting: Oncology

## 2013-01-28 ENCOUNTER — Ambulatory Visit: Payer: BC Managed Care – PPO

## 2013-01-28 ENCOUNTER — Ambulatory Visit (HOSPITAL_BASED_OUTPATIENT_CLINIC_OR_DEPARTMENT_OTHER): Payer: BC Managed Care – PPO | Admitting: Oncology

## 2013-01-28 VITALS — BP 116/84 | HR 92 | Temp 98.7°F | Resp 20 | Ht 63.66 in | Wt 140.4 lb

## 2013-01-28 DIAGNOSIS — R109 Unspecified abdominal pain: Secondary | ICD-10-CM

## 2013-01-28 DIAGNOSIS — Z853 Personal history of malignant neoplasm of breast: Secondary | ICD-10-CM

## 2013-01-28 NOTE — Progress Notes (Signed)
[facility]  Telephone:([phone] Fax:(336) [phone]   MEDICAL ONCOLOGY - INITIAL CONSULATION    Referral MD  Dr. Laurette Schimke Dr. Kerin Perna  Reason for Referral: 32 year old female with prior history of stage I breast cancer treated at the age of 2 and possibly a spinal tumor. I do not have any of these medical record  No chief complaint on file. : history of left-sided breast cancer stage I at the age of 25  HPI: patient is a very pleasant 32 year old African American female. She originally lived in [place]. She tells me that she was diagnosed at the age of 62 with left-sided breast cancer. She did not have surgery but did undergo radiation therapy and treatment was consisting of chemotherapy and radiation therapy. I do not have any records of this. Patient also tells me that she had a spinal tumor on her back which was also treated and radiated. She tells me that the physician who treated her is no longer in practice and is wanted by the FDA for medical charges. Patient was recently seen by Dr. Laurette Schimke for abdominal pain she has an ovarian cyst which is very painful and patient wants to have this taken out. Because of patient's family history and her personal history she was referred to me for further discussions. Upon further questioning about the family history patient does give me a history of her older sister who was 66 having breast cancer she hasn't [place] I did request that she give [place] some records to see if she was genetically tested. Patient also says  2 paternal aunts had breast cancer at the age of 50 and 38 respectively there is a paternal cousin with ovarian cancer at the age of 74.   Past Medical History  Diagnosis Date  . Fibromyalgia   . Back pain   . Ovarian cyst   . Cancer     breast CA at 32 y/o  . Cancer of spine     spine CA for past 10 years  . Breast cancer   :  Past Surgical History  Procedure Laterality Date  .  Tubal ligation    . Cesarean section    . Ovarian cyst removal    :  Current Outpatient Prescriptions  Medication Sig Dispense Refill  . cyclobenzaprine (FLEXERIL) 10 MG tablet Take 1 tablet (10 mg total) by mouth 2 (two) times daily as needed for muscle spasms.  30 tablet  0  . fentaNYL (DURAGESIC - DOSED MCG/HR) 12 MCG/HR Place 1 patch (12.5 mcg total) onto the skin every 3 (three) days.  10 patch  0  . HYDROcodone-acetaminophen (NORCO/VICODIN) 5-325 MG per tablet Take 1 tablet by mouth every 6 (six) hours as needed for pain.  120 tablet  0  . ketorolac (TORADOL) 10 MG tablet Take 1 tablet (10 mg total) by mouth every 6 (six) hours as needed for pain (Start 01/19/13).  30 tablet  0  . methocarbamol (ROBAXIN) 500 MG tablet Take 500 mg by mouth 4 (four) times daily.      . meloxicam (MOBIC) 15 MG tablet Take 1 tablet (15 mg total) by mouth daily.  30 tablet  1  . methylPREDNIsolone (MEDROL DOSPACK) 4 MG tablet follow package directions  21 tablet  0   No current facility-administered medications for this visit.     No Known Allergies:  Family History  Problem Relation Age of Onset  . Hypertension Mother   . Anemia Mother   .  Cancer Maternal Aunt     breast  . Kidney disease Maternal Grandmother   . Diabetes Maternal Grandmother   . Diabetes Paternal Grandfather   . Lupus Other   . Multiple sclerosis Other   :  History   Social History  . Marital Status: Married    Spouse Name: N/A    Number of Children: N/A  . Years of Education: N/A   Occupational History  . Not on file.   Social History Main Topics  . Smoking status: Never Smoker   . Smokeless tobacco: Not on file  . Alcohol Use: 0.6 oz/week    1 Glasses of wine per week  . Drug Use: No  . Sexual Activity: Yes   Other Topics Concern  . Not on file   Social History Narrative  . No narrative on file  :  Review of Systems  Constitutional: Positive for malaise/fatigue. Negative for fever, chills, weight loss  and diaphoresis.  HENT: Negative for hearing loss, ear pain, nosebleeds, congestion, sore throat, tinnitus and ear discharge.   Eyes: Negative for blurred vision, double vision, photophobia, pain, discharge and redness.  Respiratory: Positive for shortness of breath. Negative for cough, hemoptysis, sputum production, wheezing and stridor.   Cardiovascular: Positive for chest pain. Negative for palpitations, orthopnea, claudication, leg swelling and PND.  Genitourinary: Positive for flank pain.  Musculoskeletal: Positive for myalgias, back pain and joint pain.  Skin: Negative for itching and rash.  Neurological: Positive for tingling, focal weakness, weakness and headaches.  Psychiatric/Behavioral: The patient is nervous/anxious.      Exam: Filed Vitals:   01/28/13 1317  BP: 116/84  Pulse: 92  Temp: 98.7 F (37.1 C)  Resp: 20     General:  well-nourished in no acute distress.  Eyes:  no scleral icterus.  ENT:  There were no oropharyngeal lesions.  Neck was without thyromegaly.  Lymphatics:  Negative cervical, supraclavicular or axillary adenopathy.  Respiratory: lungs were clear bilaterally without wheezing or crackles.  Cardiovascular:  Regular rate and rhythm, S1/S2, without murmur, rub or gallop.  There was no pedal edema.  GI:  abdomen was soft, flat, nontender, nondistended, without organomegaly.  Muscoloskeletal:  + spinal tenderness of palpation of vertebral spine.  Skin exam was without echymosis, petichae.  Neuro exam was nonfocal.  Patient was able to get on and off exam table without assistance.  Gait was normal.  Patient was alerted and oriented.  Attention was good.   Language was appropriate.  Mood was normal without depression.  Speech was not pressured.  Thought content was not tangential.   Breasts: breasts appear normal, no suspicious masses, no skin or nipple changes or axillary nodes.    Lab Results  Component Value Date   WBC 9.3 01/18/2013   HGB 11.8* 01/18/2013    HCT 35.2* 01/18/2013   PLT 301 01/18/2013   GLUCOSE 85 01/18/2013   CHOL 177 07/03/2008   TRIG 73 07/03/2008   HDL 61.0 07/03/2008   LDLCALC 101* 07/03/2008   ALT 6 01/18/2013   AST 12 01/18/2013   NA 138 01/18/2013   K 4.0 01/18/2013   CL 102 01/18/2013   CREATININE 0.58 01/18/2013   BUN 13 01/18/2013   CO2 25 01/18/2013   TSH 0.382 06/11/2012   HGBA1C 5.3 06/30/2012    Dg Thoracic Spine 2 View  01/12/2013   *RADIOLOGY REPORT*  Clinical Data: Upper and lower back pain, fell 4 days ago  THORACIC SPINE - 2 VIEW  Comparison:  None  Findings: 12 pairs of ribs. Osseous mineralization grossly normal. Broad-based dextroconvex thoracic scoliosis apex T9. Vertebral body and disc space heights maintained. No acute fracture, subluxation or bone destruction. Visualized portions of the posterior ribs appear intact.  IMPRESSION: Minimal broad-based dextroconvex thoracic scoliosis. Otherwise negative exam.   Original Report Authenticated By: Ulyses Southward, M.D.   Dg Lumbar Spine Complete W/bend  01/12/2013   *RADIOLOGY REPORT*  Clinical Data: Upper and lower back pain, fell 4 days ago  LUMBAR SPINE - COMPLETE WITH BENDING VIEWS  Comparison: 92,013  Findings: Mild broad-based levoconvex lumbar scoliosis apex L3. Five non-rib bearing lumbar vertebrae. Osseous mineralization normal. Vertebral body and disc space heights maintained. No acute fracture, subluxation, or bone destruction. No spondylolysis. No abnormal motion identified with flexion or extension.  IMPRESSION: Levoconvex lumbar scoliosis. Otherwise negative exam.   Original Report Authenticated By: Ulyses Southward, M.D.   US Transvaginal Non-ob  01/18/2013   *RADIOLOGY REPORT*  Clinical Data:  Left lower quadrant pain  TRANSABDOMINAL AND TRANSVAGINAL ULTRASOUND OF PELVIS DOPPLER ULTRASOUND OF OVARIES  Technique:  Both transabdominal and transvaginal ultrasound examinations of the pelvis were performed. Transabdominal technique was performed for global imaging of the pelvis including  uterus, ovaries, adnexal regions, and pelvic cul-de-sac.  It was necessary to proceed with endovaginal exam following the transabdominal exam to visualize the ovaries.  Color and duplex Doppler ultrasound was utilized to evaluate blood flow to the ovaries.  Comparison:  None.  FINDINGS  Uterus:  9.5 x 4.1 x 5.0 cm.  No myometrial masses.  Endometrium:  8.5 mm in thickness and uniform.  Right ovary: 3.0 x 2.3 x 2.0 cm.  The right ovary contains a complex heterogeneous cystic lesion measuring 1.2 cm.Normal arterial and venous wave forms are documented in the right ovary.  Left ovary: Normal ovarian tissue within the left adnexa measures 3.0 x 2.2 x 2.7 cm.  There is a 2.3 x 3.0 x 3.0 cm mass adjacent to and inseparable from the left ovary that appears to be a pedunculated left ovarian mass.  There is no definite color flow within the mass.  Normal arterial and venous Doppler wave forms are demonstrated in the left ovary.  Pulsed Doppler evaluation demonstrates normal low-resistance arterial and venous waveforms in both ovaries.  Additional findings:  Trace free fluid.  IMPRESSION:  No evidence of ovarian torsion.  1.2 cm complex cystic lesion in the right ovary.  This is indeterminate that can be followed in 6 weeks to ensure resolution as it may simply represent a hemorrhagic cyst.  3.0 x 3.0 x 2.3 cm lesion attached to the left ovary as described above.  This may represent a benign lesion such as a dermoid however it is indeterminate.  Consider MRI or surgical consultation for further evaluation.   Original Report Authenticated By: Jolaine Click, M.D.   US Pelvis Complete  01/18/2013   *RADIOLOGY REPORT*  Clinical Data:  Left lower quadrant pain  TRANSABDOMINAL AND TRANSVAGINAL ULTRASOUND OF PELVIS DOPPLER ULTRASOUND OF OVARIES  Technique:  Both transabdominal and transvaginal ultrasound examinations of the pelvis were performed. Transabdominal technique was performed for global imaging of the pelvis including  uterus, ovaries, adnexal regions, and pelvic cul-de-sac.  It was necessary to proceed with endovaginal exam following the transabdominal exam to visualize the ovaries.  Color and duplex Doppler ultrasound was utilized to evaluate blood flow to the ovaries.  Comparison:  None.  FINDINGS  Uterus:  9.5 x 4.1 x 5.0 cm.  No myometrial  masses.  Endometrium:  8.5 mm in thickness and uniform.  Right ovary: 3.0 x 2.3 x 2.0 cm.  The right ovary contains a complex heterogeneous cystic lesion measuring 1.2 cm.Normal arterial and venous wave forms are documented in the right ovary.  Left ovary: Normal ovarian tissue within the left adnexa measures 3.0 x 2.2 x 2.7 cm.  There is a 2.3 x 3.0 x 3.0 cm mass adjacent to and inseparable from the left ovary that appears to be a pedunculated left ovarian mass.  There is no definite color flow within the mass.  Normal arterial and venous Doppler wave forms are demonstrated in the left ovary.  Pulsed Doppler evaluation demonstrates normal low-resistance arterial and venous waveforms in both ovaries.  Additional findings:  Trace free fluid.  IMPRESSION:  No evidence of ovarian torsion.  1.2 cm complex cystic lesion in the right ovary.  This is indeterminate that can be followed in 6 weeks to ensure resolution as it may simply represent a hemorrhagic cyst.  3.0 x 3.0 x 2.3 cm lesion attached to the left ovary as described above.  This may represent a benign lesion such as a dermoid however it is indeterminate.  Consider MRI or surgical consultation for further evaluation.   Original Report Authenticated By: Jolaine Click, M.D.   [place] Art/ven Flow Abd Pelv Doppler  01/18/2013   *RADIOLOGY REPORT*  Clinical Data:  Left lower quadrant pain  TRANSABDOMINAL AND TRANSVAGINAL ULTRASOUND OF PELVIS DOPPLER ULTRASOUND OF OVARIES  Technique:  Both transabdominal and transvaginal ultrasound examinations of the pelvis were performed. Transabdominal technique was performed for global imaging of the pelvis  including uterus, ovaries, adnexal regions, and pelvic cul-de-sac.  It was necessary to proceed with endovaginal exam following the transabdominal exam to visualize the ovaries.  Color and duplex Doppler ultrasound was utilized to evaluate blood flow to the ovaries.  Comparison:  None.  FINDINGS  Uterus:  9.5 x 4.1 x 5.0 cm.  No myometrial masses.  Endometrium:  8.5 mm in thickness and uniform.  Right ovary: 3.0 x 2.3 x 2.0 cm.  The right ovary contains a complex heterogeneous cystic lesion measuring 1.2 cm.Normal arterial and venous wave forms are documented in the right ovary.  Left ovary: Normal ovarian tissue within the left adnexa measures 3.0 x 2.2 x 2.7 cm.  There is a 2.3 x 3.0 x 3.0 cm mass adjacent to and inseparable from the left ovary that appears to be a pedunculated left ovarian mass.  There is no definite color flow within the mass.  Normal arterial and venous Doppler wave forms are demonstrated in the left ovary.  Pulsed Doppler evaluation demonstrates normal low-resistance arterial and venous waveforms in both ovaries.  Additional findings:  Trace free fluid.  IMPRESSION:  No evidence of ovarian torsion.  1.2 cm complex cystic lesion in the right ovary.  This is indeterminate that can be followed in 6 weeks to ensure resolution as it may simply represent a hemorrhagic cyst.  3.0 x 3.0 x 2.3 cm lesion attached to the left ovary as described above.  This may represent a benign lesion such as a dermoid however it is indeterminate.  Consider MRI or surgical consultation for further evaluation.   Original Report Authenticated By: Jolaine Click, M.D.      Dg Thoracic Spine 2 View  01/12/2013   *RADIOLOGY REPORT*  Clinical Data: Upper and lower back pain, fell 4 days ago  THORACIC SPINE - 2 VIEW  Comparison: None  Findings: 12  pairs of ribs. Osseous mineralization grossly normal. Broad-based dextroconvex thoracic scoliosis apex T9. Vertebral body and disc space heights maintained. No acute fracture,  subluxation or bone destruction. Visualized portions of the posterior ribs appear intact.  IMPRESSION: Minimal broad-based dextroconvex thoracic scoliosis. Otherwise negative exam.   Original Report Authenticated By: Ulyses Southward, M.D.   Dg Lumbar Spine Complete W/bend  01/12/2013   *RADIOLOGY REPORT*  Clinical Data: Upper and lower back pain, fell 4 days ago  LUMBAR SPINE - COMPLETE WITH BENDING VIEWS  Comparison: 92,013  Findings: Mild broad-based levoconvex lumbar scoliosis apex L3. Five non-rib bearing lumbar vertebrae. Osseous mineralization normal. Vertebral body and disc space heights maintained. No acute fracture, subluxation, or bone destruction. No spondylolysis. No abnormal motion identified with flexion or extension.  IMPRESSION: Levoconvex lumbar scoliosis. Otherwise negative exam.   Original Report Authenticated By: Ulyses Southward, M.D.   US Transvaginal Non-ob  01/18/2013   *RADIOLOGY REPORT*  Clinical Data:  Left lower quadrant pain  TRANSABDOMINAL AND TRANSVAGINAL ULTRASOUND OF PELVIS DOPPLER ULTRASOUND OF OVARIES  Technique:  Both transabdominal and transvaginal ultrasound examinations of the pelvis were performed. Transabdominal technique was performed for global imaging of the pelvis including uterus, ovaries, adnexal regions, and pelvic cul-de-sac.  It was necessary to proceed with endovaginal exam following the transabdominal exam to visualize the ovaries.  Color and duplex Doppler ultrasound was utilized to evaluate blood flow to the ovaries.  Comparison:  None.  FINDINGS  Uterus:  9.5 x 4.1 x 5.0 cm.  No myometrial masses.  Endometrium:  8.5 mm in thickness and uniform.  Right ovary: 3.0 x 2.3 x 2.0 cm.  The right ovary contains a complex heterogeneous cystic lesion measuring 1.2 cm.Normal arterial and venous wave forms are documented in the right ovary.  Left ovary: Normal ovarian tissue within the left adnexa measures 3.0 x 2.2 x 2.7 cm.  There is a 2.3 x 3.0 x 3.0 cm mass adjacent to and  inseparable from the left ovary that appears to be a pedunculated left ovarian mass.  There is no definite color flow within the mass.  Normal arterial and venous Doppler wave forms are demonstrated in the left ovary.  Pulsed Doppler evaluation demonstrates normal low-resistance arterial and venous waveforms in both ovaries.  Additional findings:  Trace free fluid.  IMPRESSION:  No evidence of ovarian torsion.  1.2 cm complex cystic lesion in the right ovary.  This is indeterminate that can be followed in 6 weeks to ensure resolution as it may simply represent a hemorrhagic cyst.  3.0 x 3.0 x 2.3 cm lesion attached to the left ovary as described above.  This may represent a benign lesion such as a dermoid however it is indeterminate.  Consider MRI or surgical consultation for further evaluation.   Original Report Authenticated By: Jolaine Click, M.D.   US Pelvis Complete  01/18/2013   *RADIOLOGY REPORT*  Clinical Data:  Left lower quadrant pain  TRANSABDOMINAL AND TRANSVAGINAL ULTRASOUND OF PELVIS DOPPLER ULTRASOUND OF OVARIES  Technique:  Both transabdominal and transvaginal ultrasound examinations of the pelvis were performed. Transabdominal technique was performed for global imaging of the pelvis including uterus, ovaries, adnexal regions, and pelvic cul-de-sac.  It was necessary to proceed with endovaginal exam following the transabdominal exam to visualize the ovaries.  Color and duplex Doppler ultrasound was utilized to evaluate blood flow to the ovaries.  Comparison:  None.  FINDINGS  Uterus:  9.5 x 4.1 x 5.0 cm.  No myometrial masses.  Endometrium:  8.5 mm in thickness and uniform.  Right ovary: 3.0 x 2.3 x 2.0 cm.  The right ovary contains a complex heterogeneous cystic lesion measuring 1.2 cm.Normal arterial and venous wave forms are documented in the right ovary.  Left ovary: Normal ovarian tissue within the left adnexa measures 3.0 x 2.2 x 2.7 cm.  There is a 2.3 x 3.0 x 3.0 cm mass adjacent to and  inseparable from the left ovary that appears to be a pedunculated left ovarian mass.  There is no definite color flow within the mass.  Normal arterial and venous Doppler wave forms are demonstrated in the left ovary.  Pulsed Doppler evaluation demonstrates normal low-resistance arterial and venous waveforms in both ovaries.  Additional findings:  Trace free fluid.  IMPRESSION:  No evidence of ovarian torsion.  1.2 cm complex cystic lesion in the right ovary.  This is indeterminate that can be followed in 6 weeks to ensure resolution as it may simply represent a hemorrhagic cyst.  3.0 x 3.0 x 2.3 cm lesion attached to the left ovary as described above.  This may represent a benign lesion such as a dermoid however it is indeterminate.  Consider MRI or surgical consultation for further evaluation.   Original Report Authenticated By: Jolaine Click, M.D.   [place] Art/ven Flow Abd Pelv Doppler  01/18/2013   *RADIOLOGY REPORT*  Clinical Data:  Left lower quadrant pain  TRANSABDOMINAL AND TRANSVAGINAL ULTRASOUND OF PELVIS DOPPLER ULTRASOUND OF OVARIES  Technique:  Both transabdominal and transvaginal ultrasound examinations of the pelvis were performed. Transabdominal technique was performed for global imaging of the pelvis including uterus, ovaries, adnexal regions, and pelvic cul-de-sac.  It was necessary to proceed with endovaginal exam following the transabdominal exam to visualize the ovaries.  Color and duplex Doppler ultrasound was utilized to evaluate blood flow to the ovaries.  Comparison:  None.  FINDINGS  Uterus:  9.5 x 4.1 x 5.0 cm.  No myometrial masses.  Endometrium:  8.5 mm in thickness and uniform.  Right ovary: 3.0 x 2.3 x 2.0 cm.  The right ovary contains a complex heterogeneous cystic lesion measuring 1.2 cm.Normal arterial and venous wave forms are documented in the right ovary.  Left ovary: Normal ovarian tissue within the left adnexa measures 3.0 x 2.2 x 2.7 cm.  There is a 2.3 x 3.0 x 3.0 cm mass  adjacent to and inseparable from the left ovary that appears to be a pedunculated left ovarian mass.  There is no definite color flow within the mass.  Normal arterial and venous Doppler wave forms are demonstrated in the left ovary.  Pulsed Doppler evaluation demonstrates normal low-resistance arterial and venous waveforms in both ovaries.  Additional findings:  Trace free fluid.  IMPRESSION:  No evidence of ovarian torsion.  1.2 cm complex cystic lesion in the right ovary.  This is indeterminate that can be followed in 6 weeks to ensure resolution as it may simply represent a hemorrhagic cyst.  3.0 x 3.0 x 2.3 cm lesion attached to the left ovary as described above.  This may represent a benign lesion such as a dermoid however it is indeterminate.  Consider MRI or surgical consultation for further evaluation.   Original Report Authenticated By: Jolaine Click, M.D.    Assessment and Plan: 32 year old female with   #1 personal history of left breast cancer stage I diagnosed when she was 32 years old. This was treated in [place] by one of the physicians there. Per patient's recollection she received  chemotherapy and radiation therapy but did not get surgery. Patient does not have any medical records for me to review. She tells me that she cannot obtain any of her records either as the physician in question has her case against him from the FDA. Patient also apparently had a spinal tumor as well. Patient's now being seen by me for discussion of further evaluation of her risk of developing future breast cancer and other malignancies. I do think that patient needs to be seen by our genetic counselor for further evaluation. Patient does give me a family history with a sister at 49 with stage IV breast cancer she had 2 paternal aunts with breast cancer and a paternal cousin with ovarian cancer. This is very concerning and therefore I have recommended that she be seen by Maylon Cos for genetic counseling.  #2  abdominal pain and ovarian cysts. I will defer this to Dr. Zenda Alpers.  #3 I will see the patient back in about a years time for ongoing followup. However if her genetic testing is positive then certainly I will need to see her sooner  Drue Second, MD Medical/Oncology [facility] [phone] (beeper) [phone] (Office)

## 2013-01-29 ENCOUNTER — Other Ambulatory Visit: Payer: Self-pay | Admitting: Obstetrics and Gynecology

## 2013-01-29 NOTE — Progress Notes (Signed)
Consult Note: Gyn-Onc  Consult was requested by Dr. Jolayne Panther  for the evaluation of Jessica Horn 32 y.o. female  CC:  Chief Complaint  Patient presents with  . Ovarian Masws    New consult    Assessment/Plan:  Jessica Horn  is a 32 y.o.  year old with left sided pelvic pain for several month and an incidence of severe pain two weeks ago that is c/w intermittent torsion.  This history is complicated by the report of inflammatory breast cancer.  There are no radiation changes of the left breast or identifiable incisions.  She is not currently on any treatment or surveillance for recent recurrence of her cancer.  Will refer to Dr. Welton Flakes for her assessment and  recommendation regarding BSO instead of left ovarian cystectomy and bilateral salpingectomy.  Patient asked to f/u in 2 weeks to discuss the    HPI: this is 1 32 y/o G1P1 who presents with c/o LLQ pain for several months.  The pain is described as dull but constant.   Few weeks ago the pain doubled her over and was associated with nausea, no vomiting. Pelvic ultrasound 01/18/2013 right ovary 3.0x2.3x2.0 with a complex cyst 1.2cm.  Left ovary 3.0x2.2x2.7 with a 2.3x3.0x3.0 and inseparable from the left ovary that appears to be a pedunculated left ovarian mass and trace free fluid.  Dr. Jolayne Panther recommended an MRI but the patient declined.  Surgery was previously scheduled for 01/27/2013 but cancelled.  Jessica Horn history is notable for a diagnosis of left inflammatory breast cancer at the age of 59 treated with chemotherapy and radiation therapy.  A recurrence in the sam breast 4 years ago was treated with oral agents.  Patient denies any prior recommendations for ovarian castration.  She reports a sister with stage IV breast cancer diagnosed at age 16 and a maternal aunt diagnosed with breast cancer in her early 78's.    Current Meds:  Outpatient Encounter Prescriptions as of 01/27/2013  Medication Sig Dispense Refill   . cyclobenzaprine (FLEXERIL) 10 MG tablet Take 1 tablet (10 mg total) by mouth 2 (two) times daily as needed for muscle spasms.  30 tablet  0  . fentaNYL (DURAGESIC - DOSED MCG/HR) 12 MCG/HR Place 1 patch (12.5 mcg total) onto the skin every 3 (three) days.  10 patch  0  . HYDROcodone-acetaminophen (NORCO/VICODIN) 5-325 MG per tablet Take 1 tablet by mouth every 6 (six) hours as needed for pain.  120 tablet  0  . methocarbamol (ROBAXIN) 500 MG tablet Take 500 mg by mouth 4 (four) times daily.      Marland Kitchen ketorolac (TORADOL) 10 MG tablet Take 1 tablet (10 mg total) by mouth every 6 (six) hours as needed for pain (Start 01/19/13).  30 tablet  0  . meloxicam (MOBIC) 15 MG tablet Take 1 tablet (15 mg total) by mouth daily.  30 tablet  1  . methylPREDNIsolone (MEDROL DOSPACK) 4 MG tablet follow package directions  21 tablet  0   No facility-administered encounter medications on file as of 01/27/2013.    Allergy: No Known Allergies  Social Hx:   History   Social History  . Marital Status: Married    Spouse Name: N/A    Number of Children: N/A  . Years of Education: N/A   Occupational History  . Not on file.   Social History Main Topics  . Smoking status: Never Smoker   . Smokeless tobacco: Not on file  . Alcohol Use: 0.6  oz/week    1 Glasses of wine per week  . Drug Use: No  . Sexual Activity: Yes   Other Topics Concern  . Not on file   Social History Narrative  . 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  Unable to provide any medical records of treatment for her breast cancer.  States that the treating physician returned to [place] and she has been unable to receive any documentation.  Past Surgical Hx:  Past Surgical History  Procedure Laterality Date  . Tubal ligation    . Cesarean section    . Ovarian cyst removal      Past Medical Hx:  Past Medical History  Diagnosis Date  . Fibromyalgia   . Back pain   . Ovarian cyst   . Cancer      breast CA at 32 y/o  . Cancer of spine     spine CA for past 10 years  . Breast cancer     Past Gynecological History:  G1 P1 satisfied parity Patient's last menstrual period was 01/14/2013.  H/O ocp use in the past.  Reports multiple ovarian cystectomies.  C/s x1.  Family Hx:  Family History  Problem Relation Age of Onset  . Hypertension Mother   . Anemia Mother   . Cancer Maternal Aunt     breast  . Kidney disease Maternal Grandmother   . Diabetes Maternal Grandmother   . Diabetes Paternal Grandfather   . Lupus Other   . Multiple sclerosis Other     Review of Systems:  Constitutional  Feels well Skin/Breast  No rash, sores, jaundice, itching, dryness Cardiovascular  No chest pain, shortness of breath, or edema  Pulmonary  No cough or wheeze.  Gastro Intestinal  No nausea, vomitting, or diarrhoea. No bright red blood per rectum, no abdominal pain, change in bowel movement, or constipation.  Genito Urinary  No frequency, urgency, dysuria, intermittent LLQ pain Musculo Skeletal  No myalgia, arthralgia, joint swelling or pain reports lower back pain Neurologic  Pain with ambulation,  Psychology  No depression, anxiety, insomnia.   Vitals:  Blood pressure 112/85, pulse 117, temperature 99.6 F (37.6 C), temperature source Oral, resp. rate 18, height 5' 3.66" (1.617 m), weight 138 lb (62.596 kg), last menstrual period 01/14/2013.  Physical Exam: WD in NAD, flat affect, appears unhappy but not distressed Neck  Supple NROM, without any enlargements.  Lymph Node Survey No cervical supraclavicular or inguinal adenopathy Cardiovascular  Pulse normal rate, regularity and rhythm. S1 and S2 normal.  Lungs  Clear to auscultation bilateraly, without wheezes/crackles/rhonchi. Good air movement.  Skin  No rash/lesions/breakdown  Left breast without fiirmness or skin changes Psychiatry  Alert and oriented to person, place, and time  Abdomen  Normoactive bowel sounds,  abdomen soft, mild left lower quadrant tenderness. No evidence of ascites, no palpable omental cake Back No CVA tenderness Genito Urinary  Vulva/vagina: Normal external female genitalia.  No lesions. No discharge or bleeding.  Bladder/urethra:  No lesions or masses  Vagina: well estrogenized, no cul de sac nodularity  Cervix: Normal appearing, no lesions.  Uterus: Small, mobile, no parametrial involvement or nodularity.  Adnexa: No palpable masses. Rectal  Good tone, no masses no cul de sac nodularity.  Extremities  No bilateral cyanosis, clubbing or edema.   Laurette Schimke, MD, PhD 01/29/2013, 10:47 AM

## 2013-01-31 ENCOUNTER — Telehealth: Payer: Self-pay | Admitting: Gynecologic Oncology

## 2013-01-31 NOTE — Telephone Encounter (Signed)
Patient called stating that she feels everyone is taking their time when it comes to scheduling surgery and that she needs to arrange for a babysitter and is having severe pain.  She asked about seeing another physician that is full time in the GYN Onc program so she could have surgery as soon as possible and advised that all GYN oncologists are part-time.  Advised that she would be contacted with Dr. Forrestine Him recommendations when available.  Dr. Nelly Rout notified this am of Dr. Milta Deiters office findings/recommendations.

## 2013-01-31 NOTE — Telephone Encounter (Signed)
Returned message to patient but pt unavailable.  Message left to call the office when available.

## 2013-02-01 ENCOUNTER — Encounter: Payer: Self-pay | Admitting: Gynecologic Oncology

## 2013-02-01 NOTE — Progress Notes (Signed)
This patient was referred by Dr. Jolayne Panther for intermittent pelvic pain for several months, with a severe episode of pain recently.  Dr. Jolayne Panther recommended an MRI but the patient declined and she was subsequently removed from Dr. Deretha Emory surgical schedule.  The patient reports a history of spinal cancer during childhood and inflammatory breast cancer treated with radiation and chemotherapy at age 32.  She also reports a recurrence treated 4 years ago with an oral agent.  Physical exam findings are c/w a normal breast examination.  She reports 6 prior ovarian cystectomies.  The issue at hand is the correct surgical procedure to perform.  Ovarian cystectomy versus BSO.  The patient has provided contradictory histories to different providers.  She is unable to provide any medical records because the physician left the country because of legal issues.  She is unable to provide documentation of a breast biopsy, delivery of radiation therapy chemotherapy drugs, hormonal agents.  She states that she was in college when this happened but is unable to provide any records.  Discussed the situation with risk management at [facility].  I was advised that since no life threatening intervention is required I should wait until the patient is able to provide some documentation of her history so that the correct procedure risks and benefits can be balanced.  I spoke with Ms. Jaquez and requested further documentation in order to make a precise and informed treatment recommendation.  She states that the facility where she received treatment has closed and the it is not possible to obtain records.  The physician whom she states treated her breast cancer appears to be a spinal surgeon who is still in this country.   The [facility] [place] does not exist online.  The patient plan to seek care elsewhere.

## 2013-02-02 ENCOUNTER — Other Ambulatory Visit (HOSPITAL_COMMUNITY): Payer: BC Managed Care – PPO

## 2013-02-03 ENCOUNTER — Ambulatory Visit (HOSPITAL_COMMUNITY)
Admission: RE | Admit: 2013-02-03 | Payer: BC Managed Care – PPO | Source: Ambulatory Visit | Admitting: Obstetrics and Gynecology

## 2013-02-03 ENCOUNTER — Encounter (HOSPITAL_COMMUNITY): Admission: RE | Payer: Self-pay | Source: Ambulatory Visit

## 2013-02-03 ENCOUNTER — Telehealth: Payer: Self-pay

## 2013-02-03 DIAGNOSIS — M546 Pain in thoracic spine: Secondary | ICD-10-CM

## 2013-02-03 DIAGNOSIS — M7918 Myalgia, other site: Secondary | ICD-10-CM

## 2013-02-03 DIAGNOSIS — M542 Cervicalgia: Secondary | ICD-10-CM

## 2013-02-03 SURGERY — EXCISION, CYST, OVARY, LAPAROSCOPIC
Anesthesia: Choice | Site: Abdomen | Laterality: Bilateral

## 2013-02-03 SURGERY — LAPAROSCOPY OPERATIVE
Anesthesia: General

## 2013-02-03 MED ORDER — METHOCARBAMOL 500 MG PO TABS: 500.0000 mg | ORAL_TABLET | Freq: Four times a day (QID) | ORAL | Status: DC

## 2013-02-03 MED ORDER — HYDROCODONE-ACETAMINOPHEN 5-325 MG PO TABS: 1.0000 | ORAL_TABLET | Freq: Four times a day (QID) | ORAL | Status: DC | PRN

## 2013-02-03 MED ORDER — FENTANYL 12 MCG/HR TD PT72: 1.0000 | MEDICATED_PATCH | TRANSDERMAL | Status: DC

## 2013-02-03 NOTE — Telephone Encounter (Signed)
Robaxin, hydrocodone and fentanyl patch refilled.  Controlled substance agreement placed with scripts to sign.  Patient aware.

## 2013-02-03 NOTE — Telephone Encounter (Signed)
Patient called to let us know she is having surgery on 02/04/13 to have cyst removed from her ovaries.  She will need to cancel appointment but she needs her 3 medications refilled.  She did not state which medications.

## 2013-02-04 ENCOUNTER — Encounter: Payer: BC Managed Care – PPO | Admitting: Physical Medicine & Rehabilitation

## 2013-02-07 ENCOUNTER — Encounter: Payer: Self-pay | Admitting: Gynecologic Oncology

## 2013-02-14 ENCOUNTER — Encounter (HOSPITAL_COMMUNITY): Payer: Self-pay | Admitting: *Deleted

## 2013-02-14 ENCOUNTER — Inpatient Hospital Stay (HOSPITAL_COMMUNITY)
Admission: AD | Admit: 2013-02-14 | Discharge: 2013-02-14 | Disposition: A | Discharge status: Home / Self Care | Payer: BC Managed Care – PPO | Source: Ambulatory Visit | Attending: Obstetrics & Gynecology | Admitting: Obstetrics & Gynecology

## 2013-02-14 DIAGNOSIS — N925 Other specified irregular menstruation: Secondary | ICD-10-CM | POA: Insufficient documentation

## 2013-02-14 DIAGNOSIS — N898 Other specified noninflammatory disorders of vagina: Secondary | ICD-10-CM

## 2013-02-14 DIAGNOSIS — Z853 Personal history of malignant neoplasm of breast: Secondary | ICD-10-CM | POA: Insufficient documentation

## 2013-02-14 DIAGNOSIS — N939 Abnormal uterine and vaginal bleeding, unspecified: Secondary | ICD-10-CM

## 2013-02-14 DIAGNOSIS — N949 Unspecified condition associated with female genital organs and menstrual cycle: Secondary | ICD-10-CM | POA: Insufficient documentation

## 2013-02-14 DIAGNOSIS — N938 Other specified abnormal uterine and vaginal bleeding: Secondary | ICD-10-CM | POA: Insufficient documentation

## 2013-02-14 DIAGNOSIS — N83209 Unspecified ovarian cyst, unspecified side: Secondary | ICD-10-CM | POA: Insufficient documentation

## 2013-02-14 HISTORY — DX: Depression, unspecified: F32.A

## 2013-02-14 HISTORY — DX: Major depressive disorder, single episode, unspecified: F32.9

## 2013-02-14 LAB — CBC
MCV: 82.5 fL (ref 78.0–100.0)
Platelets: 304 10*3/uL (ref 150–400)
RDW: 12.6 % (ref 11.5–15.5)
WBC: 7.3 10*3/uL (ref 4.0–10.5)

## 2013-02-14 LAB — URINALYSIS, ROUTINE W REFLEX MICROSCOPIC
Leukocytes, UA: NEGATIVE
Nitrite: NEGATIVE
Protein, ur: NEGATIVE mg/dL
Urobilinogen, UA: 0.2 mg/dL (ref 0.0–1.0)

## 2013-02-14 LAB — URINE MICROSCOPIC-ADD ON

## 2013-02-14 MED ORDER — KETOROLAC TROMETHAMINE 60 MG/2ML IM SOLN
60.0000 mg | Freq: Once | INTRAMUSCULAR | Status: AC
  Administered 2013-02-14: 60 mg via INTRAMUSCULAR
  Filled 2013-02-14: qty 2

## 2013-02-14 NOTE — Discharge Instructions (Signed)
Ovarian Cyst  The ovaries are small organs that are on each side of the uterus. The ovaries are the organs that produce the female hormones, estrogen and progesterone. An ovarian cyst is a sac filled with fluid that can vary in its size. It is normal for a small cyst to form in women who are in the childbearing age and who have menstrual periods. This type of cyst is called a follicle cyst that becomes an ovulation cyst (corpus luteum cyst) after it produces the women's egg. It later goes away on its own if the woman does not become pregnant. There are other kinds of ovarian cysts that may cause problems and may need to be treated. The most serious problem is a cyst with cancer. It should be noted that menopausal women who have an ovarian cyst are at a higher risk of it being a cancer cyst. They should be evaluated very quickly, thoroughly and followed closely. This is especially true in menopausal women because of the high rate of ovarian cancer in women in menopause.  CAUSES AND TYPES OF OVARIAN CYSTS:   FUNCTIONAL CYST: The follicle/corpus luteum cyst is a functional cyst that occurs every month during ovulation with the menstrual cycle. They go away with the next menstrual cycle if the woman does not get pregnant. Usually, there are no symptoms with a functional cyst.   ENDOMETRIOMA CYST: This cyst develops from the lining of the uterus tissue. This cyst gets in or on the ovary. It grows every month from the bleeding during the menstrual period. It is also called a "chocolate cyst" because it becomes filled with blood that turns brown. This cyst can cause pain in the lower abdomen during intercourse and with your menstrual period.   CYSTADENOMA CYST: This cyst develops from the cells on the outside of the ovary. They usually are not cancerous. They can get very big and cause lower abdomen pain and pain with intercourse. This type of cyst can twist on itself, cut off its blood supply and cause severe pain. It  also can easily rupture and cause a lot of pain.   DERMOID CYST: This type of cyst is sometimes found in both ovaries. They are found to have different kinds of body tissue in the cyst. The tissue includes skin, teeth, hair, and/or cartilage. They usually do not have symptoms unless they get very big. Dermoid cysts are rarely cancerous.   POLYCYSTIC OVARY: This is a rare condition with hormone problems that produces many small cysts on both ovaries. The cysts are follicle-like cysts that never produce an egg and become a corpus luteum. It can cause an increase in body weight, infertility, acne, increase in body and facial hair and lack of menstrual periods or rare menstrual periods. Many women with this problem develop type 2 diabetes. The exact cause of this problem is unknown. A polycystic ovary is rarely cancerous.   THECA LUTEIN CYST: Occurs when too much hormone (human chorionic gonadotropin) is produced and over-stimulates the ovaries to produce an egg. They are frequently seen when doctors stimulate the ovaries for invitro-fertilization (test tube babies).   LUTEOMA CYST: This cyst is seen during pregnancy. Rarely it can cause an obstruction to the birth canal during labor and delivery. They usually go away after delivery.  SYMPTOMS    Pelvic pain or pressure.   Pain during sexual intercourse.   Increasing girth (swelling) of the abdomen.   Abnormal menstrual periods.   Increasing pain with menstrual periods.     You stop having menstrual periods and you are not pregnant.  DIAGNOSIS   The diagnosis can be made during:   Routine or annual pelvic examination (common).   Ultrasound.   X-ray of the pelvis.   CT Scan.   MRI.   Blood tests.  TREATMENT    Treatment may only be to follow the cyst monthly for 2 to 3 months with your caregiver. Many go away on their own, especially functional cysts.   May be aspirated (drained) with a long needle with ultrasound, or by laparoscopy (inserting a tube into  the pelvis through a small incision).   The whole cyst can be removed by laparoscopy.   Sometimes the cyst may need to be removed through an incision in the lower abdomen.   Hormone treatment is sometimes used to help dissolve certain cysts.   Birth control pills are sometimes used to help dissolve certain cysts.  HOME CARE INSTRUCTIONS   Follow your caregiver's advice regarding:   Medicine.   Follow up visits to evaluate and treat the cyst.   You may need to come back or make an appointment with another caregiver, to find the exact cause of your cyst, if your caregiver is not a gynecologist.   Get your yearly and recommended pelvic examinations and Pap tests.   Let your caregiver know if you have had an ovarian cyst in the past.  SEEK MEDICAL CARE IF:    Your periods are late, irregular, they stop, or are painful.   Your stomach (abdomen) or pelvic pain does not go away.   Your stomach becomes larger or swollen.   You have pressure on your bladder or trouble emptying your bladder completely.   You have painful sexual intercourse.   You have feelings of fullness, pressure, or discomfort in your stomach.   You lose weight for no apparent reason.   You feel generally ill.   You become constipated.   You lose your appetite.   You develop acne.   You have an increase in body and facial hair.   You are gaining weight, without changing your exercise and eating habits.   You think you are pregnant.  SEEK IMMEDIATE MEDICAL CARE IF:    You have increasing abdominal pain.   You feel sick to your stomach (nausea) and/or vomit.   You develop a fever that comes on suddenly.   You develop abdominal pain during a bowel movement.   Your menstrual periods become heavier than usual.  Document Released: 04/28/2005 Document Revised: 07/21/2011 Document Reviewed: 03/01/2009  Exit[employer] Patient Information 2014 [employer].

## 2013-02-14 NOTE — MAU Provider Note (Signed)
History     CSN: [id number]  Arrival date and time: 02/14/13 1954   First Provider Initiated Contact with Patient 02/14/13 2150      No chief complaint on file.  HPI [name] is a 32 y.o. [phone] who presents to MAU today with complaint of vaginal bleeding and pelvic pain. The patient states that she had a normal period "for her" on 02/07/13. The bleeding stopped on 02/10/13. The patient has known a ovarian cyst on [place] on 01/18/13 and has a history of multiple cycstectomies per patient. The patient also has a personal history of breast cancer 10 years ago and bone cancer of the spine as a child. [place] also showed a lesion of the left ovary on 01/18/13. The patient followed up in GYN clinic at the [facility] with Dr. Jolayne Panther. Per the notes in Epic, Dr. Jolayne Panther explained the need for MRI thoroughly and the patient refused secondary to financial concerns. The patient's surgery for an ovarian cystectomy was cancelled and she was referred to GYN ONC because of the possibility that this could be a form of metastatic breast cancer. The patient states that she went to GYN ONC and was told that they would not treat her unless she could provide the records from her previous cancer diagnoses and treatment. The patient states that the MD who treated her breast cancer has left the country because he is wanted by the IRS and she is unable to get records from his previous facility. The MD note from GYN ONC mentions that the facility name given by the patient was not able to be found on the Internet. The patient is on multiple pain medications at home and has had a pain contract recently, although she states today that she does not have a PCP, but she does have plenty of pain medication at home. She rates her pelvic pain at 10/10 now and states that it has been like that x 3 weeks. She feels that the bleeding has been like a heavy period since this morning. She denies any abnormal discharge otherwise and has not had  fever.    OB History   Grav Para Term Preterm Abortions TAB SAB Ect Mult Living   3 1  1 2  2   1       Past Medical History  Diagnosis Date  . Fibromyalgia   . Back pain   . Ovarian cyst   . Cancer     breast CA at 32 y/o  . Cancer of spine     spine CA for past 10 years  . Breast cancer   . Depression     Past Surgical History  Procedure Laterality Date  . Tubal ligation    . Cesarean section    . Ovarian cyst removal      Family History  Problem Relation Age of Onset  . Hypertension Mother   . Anemia Mother   . Cancer Maternal Aunt     breast  . Kidney disease Maternal Grandmother   . Diabetes Maternal Grandmother   . Diabetes Paternal Grandfather   . Lupus Other   . Multiple sclerosis Other     History  Substance Use Topics  . Smoking status: Never Smoker   . Smokeless tobacco: Not on file  . Alcohol Use: 0.6 oz/week    1 Glasses of wine per week    Allergies: No Known Allergies  Prescriptions prior to admission  Medication Sig Dispense Refill  . cyclobenzaprine (FLEXERIL)  10 MG tablet Take 1 tablet (10 mg total) by mouth 2 (two) times daily as needed for muscle spasms.  30 tablet  0  . fentaNYL (DURAGESIC - DOSED MCG/HR) 12 MCG/HR Place 1 patch (12.5 mcg total) onto the skin every 3 (three) days.  10 patch  0  . HYDROcodone-acetaminophen (NORCO/VICODIN) 5-325 MG per tablet Take 1 tablet by mouth every 6 (six) hours as needed for pain.  120 tablet  0  . ketorolac (TORADOL) 10 MG tablet TAKE 1 TABLET (10 MG TOTAL) BY MOUTH EVERY 6 (SIX) HOURS AS NEEDED FOR PAIN (START 01/19/13).  30 tablet  0  . methocarbamol (ROBAXIN) 500 MG tablet Take 1 tablet (500 mg total) by mouth 4 (four) times daily.  120 tablet  1  . methylPREDNIsolone (MEDROL DOSPACK) 4 MG tablet follow package directions  21 tablet  0    Review of Systems  Constitutional: Positive for malaise/fatigue. Negative for fever.  Gastrointestinal: Positive for nausea, vomiting and abdominal pain.  Negative for diarrhea and constipation.  Genitourinary:       + vaginal bleeding Neg - vaginal discharge  Neurological: Positive for dizziness and weakness. Negative for loss of consciousness.   Physical Exam   Blood pressure 114/75, pulse 86, temperature 98.8 F (37.1 C), temperature source Oral, resp. rate 20, height 5\' 4"  (1.626 m), weight 133 lb (60.328 kg), last menstrual period 02/07/2013.  Physical Exam  Constitutional: She is oriented to person, place, and time. She appears well-developed and well-nourished. No distress.  HENT:  Head: Normocephalic and atraumatic.  Cardiovascular: Normal rate, regular rhythm and normal heart sounds.   Respiratory: Effort normal and breath sounds normal. No respiratory distress.  GI: Soft. Bowel sounds are normal. She exhibits no distension and no mass. There is no tenderness. There is no rebound and no guarding.  Genitourinary: Uterus is tender. Uterus is not enlarged. Cervix exhibits discharge (small amount of blood noted at the cervical os). Cervix exhibits no motion tenderness and no friability. Right adnexum displays tenderness. Right adnexum displays no mass. Left adnexum displays tenderness. Left adnexum displays no mass. There is bleeding (scant blood noted in the vaginal vault) around the vagina. No vaginal discharge found.  Neurological: She is alert and oriented to person, place, and time.  Skin: Skin is warm and dry. No erythema.  Psychiatric: She has a normal mood and affect.   Results for orders placed during the hospital encounter of 02/14/13 (from the past 24 hour(s))  URINALYSIS, ROUTINE W REFLEX MICROSCOPIC     Status: Abnormal   Collection Time    02/14/13  9:05 PM      Result Value Range   Color, Urine YELLOW  YELLOW   APPearance CLEAR  CLEAR   Specific Gravity, Urine 1.015  1.005 - 1.030   pH 6.0  5.0 - 8.0   Glucose, UA NEGATIVE  NEGATIVE mg/dL   Hgb urine dipstick LARGE (*) NEGATIVE   Bilirubin Urine NEGATIVE  NEGATIVE    Ketones, ur 15 (*) NEGATIVE mg/dL   Protein, ur NEGATIVE  NEGATIVE mg/dL   Urobilinogen, UA 0.2  0.0 - 1.0 mg/dL   Nitrite NEGATIVE  NEGATIVE   Leukocytes, UA NEGATIVE  NEGATIVE  URINE MICROSCOPIC-ADD ON     Status: Abnormal   Collection Time    02/14/13  9:05 PM      Result Value Range   Squamous Epithelial / LPF FEW (*) RARE   WBC, UA 0-2  <3 WBC/hpf   RBC /  HPF 21-50  <3 RBC/hpf   Bacteria, UA FEW (*) RARE  CBC     Status: Abnormal   Collection Time    02/14/13 10:45 PM      Result Value Range   WBC 7.3  4.0 - 10.5 K/uL   RBC 4.12  3.87 - 5.11 MIL/uL   Hemoglobin 11.6 (*) 12.0 - 15.0 g/dL   HCT 96.0 (*) 45.4 - 09.8 %   MCV 82.5  78.0 - 100.0 fL   MCH 28.2  26.0 - 34.0 pg   MCHC 34.1  30.0 - 36.0 g/dL   RDW 11.9  14.7 - 82.9 %   Platelets 304  150 - 400 K/uL    MAU Course  Procedures None  MDM UA and CBC today Discussed patient with Dr. Erin Fulling, get CBC to ensure hemodynamically stable today. Ok to give Toradol for pain while in MAU. Patient may be discharged with instructions to take previously prescribed pain medication and follow-up with her provider of choice Patient is hemodynamically stable today. Bleeding is scant on exam. 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 Nelly Rout were "inappropriate" and so the patient has been in contact with Jessica Horn at [facility] to complain about the care of Dr. Nelly Rout with GYN ONC. She is supposed to be in touch with the patient tomorrow to develop a plan for her follow-up and further management of her pelvic pain and ovarian cyst. I have discussed with the patient that she may continue her previously prescribed pain medications as directed and that she should follow-up with Jessica Horn regarding her plan for future evaluation and management of her ovarian cyst.  Upon discharge the patient mentions that she has noticed a few small clots in her pad. I have explained at length the process of clotting  with regard to vaginal bleeding and how it is essentially a normal reaction to bleeding. The patient voiced understanding.  Assessment and Plan  A: Vaginal bleeding Ovarian cyst  P: Discharge home Bleeding precautions discussed Patient advised to follow-up with Jessica Horn tomorrow in order to finalize a plan for further evaluation and management Patient advised to continue previously prescribed pain medications as directed Patient may return to MAU as needed or if her condition were to change or worsen  Freddi Starr, PA-C  02/14/2013, 11:07 PM

## 2013-02-14 NOTE — MAU Note (Addendum)
PT SAYS SHE HAS OVARIAN CYST-   HAS BEEN HERE AND [facility].   HAS VAG BLEEDING - STARTED  TODAY AT 1100-    NO DR.  HAS PAD ON - IN TRIAGE- SMALL AMT   RED.   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          ALSO HAS A TUMOR ON OVARY- TOLD BY  [facility].    HAD BREAST CANCER 10 YEARS AGO.     USES      CANE FROM  BACK SURGERY.   HAS PAIN IN ABD- NO MEDS -  STARTED  ALL TIME.  [place] TRAVEL  -[place]  HAD [facility]-2011

## 2013-02-17 NOTE — MAU Provider Note (Signed)
I have reviewed this pts entire chart and agree with the above plan of care. Andric Kerce L. Harraway-Smith, M.D., Evern Core

## 2013-02-24 ENCOUNTER — Encounter: Payer: Self-pay | Admitting: *Deleted

## 2013-03-03 ENCOUNTER — Encounter: Payer: Self-pay | Admitting: Advanced Practice Midwife

## 2013-03-03 ENCOUNTER — Encounter: Payer: Self-pay | Admitting: *Deleted

## 2013-03-04 ENCOUNTER — Encounter
Payer: BC Managed Care – PPO | Attending: Physical Medicine & Rehabilitation | Admitting: Physical Medicine & Rehabilitation

## 2013-03-04 ENCOUNTER — Encounter: Payer: Self-pay | Admitting: Physical Medicine & Rehabilitation

## 2013-03-04 VITALS — BP 105/65 | HR 93 | Resp 14 | Ht 63.0 in | Wt 133.0 lb

## 2013-03-04 DIAGNOSIS — IMO0001 Reserved for inherently not codable concepts without codable children: Secondary | ICD-10-CM | POA: Insufficient documentation

## 2013-03-04 DIAGNOSIS — D6800 Von Willebrand disease, unspecified: Secondary | ICD-10-CM

## 2013-03-04 DIAGNOSIS — G4700 Insomnia, unspecified: Secondary | ICD-10-CM

## 2013-03-04 DIAGNOSIS — M7918 Myalgia, other site: Secondary | ICD-10-CM

## 2013-03-04 DIAGNOSIS — M25539 Pain in unspecified wrist: Secondary | ICD-10-CM

## 2013-03-04 DIAGNOSIS — R269 Unspecified abnormalities of gait and mobility: Secondary | ICD-10-CM

## 2013-03-04 DIAGNOSIS — C50919 Malignant neoplasm of unspecified site of unspecified female breast: Secondary | ICD-10-CM

## 2013-03-04 DIAGNOSIS — M542 Cervicalgia: Secondary | ICD-10-CM | POA: Insufficient documentation

## 2013-03-04 DIAGNOSIS — M546 Pain in thoracic spine: Secondary | ICD-10-CM | POA: Insufficient documentation

## 2013-03-04 MED ORDER — QUETIAPINE FUMARATE 50 MG PO TABS: 50.0000 mg | ORAL_TABLET | Freq: Every day | ORAL | Status: DC

## 2013-03-04 MED ORDER — FENTANYL 12 MCG/HR TD PT72: 1.0000 | MEDICATED_PATCH | TRANSDERMAL | Status: DC

## 2013-03-04 MED ORDER — HYDROCODONE-ACETAMINOPHEN 5-325 MG PO TABS: 1.0000 | ORAL_TABLET | Freq: Four times a day (QID) | ORAL | Status: DC | PRN

## 2013-03-04 NOTE — Patient Instructions (Addendum)
TAKE THE MELATONIN WITH YOUR SEROQUEL   ASK THE GENETICIST ABOUT TESTING FOR YOUR FAMILIAL BALANCE DISORDER

## 2013-03-04 NOTE — Progress Notes (Signed)
Subjective:    Patient ID: Jessica Horn, female    DOB: 1981-01-20, 32 y.o.   MRN: [id number]  HPI  Jessica Horn is back regarding her chronic pain. She developed some vaginal bleeding and was found to have bilateral ovarian cyst. One ruptured and provided some relief. She ultimately has decided to pursue surgery at [facility] or [facility]. She is also set up to see a geneticist this fall in regards to her breast cancer and family history.   Pain medications are working fairly well. Her bowels and bladder are regular. They remain fairly effective. She feels that her back pain as a whole is better and she is needing less medication for breakthrough pain. She is continuing with her exercises. She uses a cane for balance.    Pain Inventory Average Pain 7 Pain Right Now 7 My pain is sharp and stabbing  In the last 24 hours, has pain interfered with the following? General activity 10 Relation with others 10 Enjoyment of life 10 What TIME of day is your pain at its worst? all the time Sleep (in general) Fair  Pain is worse with: walking, bending, standing and some activites Pain improves with: medication Relief from Meds: 7  Mobility use a cane ability to climb steps?  no do you drive?  no Do you have any goals in this area?  no  Function not employed: date last employed 2 years ago  Neuro/Psych trouble walking  Prior Studies x-rays CT/MRI  Physicians involved in your care Any changes since last visit?  no   Family History  Problem Relation Age of Onset  . Hypertension Mother   . Anemia Mother   . Cancer Maternal Aunt     breast  . Kidney disease Maternal Grandmother   . Diabetes Maternal Grandmother   . Diabetes Paternal Grandfather   . Lupus Other   . Multiple sclerosis Other    History   Social History  . Marital Status: Married    Spouse Name: N/A    Number of Children: N/A  . Years of Education: N/A   Social History Main Topics  . Smoking status: Never Smoker    . Smokeless tobacco: None  . Alcohol Use: 0.6 oz/week    1 Glasses of wine per week  . Drug Use: No  . Sexual Activity: Yes   Other Topics Concern  . None   Social History Narrative  . None   Past Surgical History  Procedure Laterality Date  . Tubal ligation    . Cesarean section    . Ovarian cyst removal     Past Medical History  Diagnosis Date  . Fibromyalgia   . Back pain   . Ovarian cyst   . Cancer     breast CA at 32 y/o  . Cancer of spine     spine CA for past 10 years  . Breast cancer   . Depression    BP 105/65  Pulse 93  Resp 14  Ht 5\' 3"  (1.6 m)  Wt 133 lb (60.328 kg)  BMI 23.57 kg/m2  SpO2 97%  LMP 02/16/2013     Review of Systems  Gastrointestinal: Positive for abdominal pain.  Musculoskeletal: Positive for back pain.  All other systems reviewed and are negative.       Objective:   Physical Exam  General: Alert and oriented x 3, Appears generally uncomfortable.  HEENT: Head is normocephalic, atraumatic, PERRLA, EOMI, sclera anicteric, oral mucosa pink and moist, dentition intact, ext  ear canals clear,  Neck: Supple without JVD or lymphadenopathy  Heart: Reg rate and rhythm. No murmurs rubs or gallops  Chest: CTA bilaterally without wheezes, rales, or rhonchi; no distress  Abdomen: Soft, non-tender, non-distended, bowel sounds positive.  Extremities: No clubbing, cyanosis, or edema. Pulses are 2+  Skin: Clean and intact without signs of breakdown   Neuro: Pt is cognitively appropriate with normal insight, memory, and awareness. Occasionally had mild attention issues. Cranial nerves 2-12 are intact. I saw no nystagmus. Sensory exam is normal for PP and LT although proprioception was decreased in both legs, feet seemed to be somewhat intact. She had diminished FMC of both arms and legs and had difficulty with heel to shin, finger to nose and rapid alternating movements. I would even call it a low amplitude ataxia. She has intentional tremor in  both hands. Reflexes are 2+ in all 4's. Motor function is grossly 5/5 in UE's with pain inhibition. She displayed 4/5 strength in both lower extremities, perhaps 4- proximally. She uses the cane to unload her right pack and tends to lift her right shoulder superiorly to achieve the same.    Musculoskeletal: posture was fair. She continues to have a bit of a head forward position. Tender points/areas were less painful.  Psych: Pt was much more anxious today.  Assessment & Plan:   1. Chronic cervico-thoracic axial spine pain. Origin could be multifactorial given her history, although recent xrays were unremarkable. This appears generally myofascial in nature with her posture/scoliosis also playing a role  2. 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 3. Depression with anxiety (PTSD?)  4. Insomnia--persistent  5. Hx of breast cancer rx'ed in 2004? With CTX and XRT  6. Hx of Von Willebrand's disease    Plan:  1. Provided her with norco for pain control 5/325 one q6prn #120. UDS was collected today  2. Given that she is seeing a geneticist for in regard to the breast cancer, I think it would be helpful a work up could be done in regard to a potential hereditary ataxia or neuropathy?   3.  Will increase the seroquel to 50mg  qhs 4. Pain psychology referral with Dr. Vickii Chafe later this year or next? 7. HEP--to build up posture, core. 8. Follow up with PA in one month. 15 min of face to face patient care time was spent during this visit. All questions were encouraged and answered.  9. Fentanyl patch was refilled as well as hydrocodone. 10. Asked pt to follow up with oncology regarding her VWD as I was unaware that she had this disease.

## 2013-03-07 ENCOUNTER — Telehealth: Payer: Self-pay | Admitting: Physical Medicine & Rehabilitation

## 2013-03-07 NOTE — Telephone Encounter (Signed)
Needs a refill for methocarbamol this was not filled on her visit on Friday 03/04/13.  Patient will be out of town as of this evening and will need this called into pharmacy [phone])

## 2013-03-08 MED ORDER — METHOCARBAMOL 500 MG PO TABS: 500.0000 mg | ORAL_TABLET | Freq: Four times a day (QID) | ORAL | Status: DC

## 2013-03-08 NOTE — Telephone Encounter (Signed)
Methocarbamol e-scribed. Patient aware.

## 2013-03-29 ENCOUNTER — Telehealth: Payer: Self-pay

## 2013-03-29 DIAGNOSIS — M7918 Myalgia, other site: Secondary | ICD-10-CM

## 2013-03-29 DIAGNOSIS — M542 Cervicalgia: Secondary | ICD-10-CM

## 2013-03-29 DIAGNOSIS — M546 Pain in thoracic spine: Secondary | ICD-10-CM

## 2013-03-29 MED ORDER — FENTANYL 12 MCG/HR TD PT72: 12.5000 ug | MEDICATED_PATCH | TRANSDERMAL | Status: DC

## 2013-03-29 MED ORDER — HYDROCODONE-ACETAMINOPHEN 5-325 MG PO TABS: 1.0000 | ORAL_TABLET | Freq: Four times a day (QID) | ORAL | Status: DC | PRN

## 2013-03-29 NOTE — Telephone Encounter (Signed)
This is ok

## 2013-03-29 NOTE — Telephone Encounter (Signed)
Patient is going to [employer] for a funeral.  She will need her medication on the 24 but will still be out of town.  She wants her husband to pick up the RX take it to the pharmacy, then have the pharmacy here in [place] transfer it to [place].  Please advise.

## 2013-03-29 NOTE — Telephone Encounter (Signed)
Patient aware fentanyl and hydrocodone script is ready for pick up.

## 2013-03-30 ENCOUNTER — Encounter: Payer: BC Managed Care – PPO | Admitting: Physical Medicine and Rehabilitation

## 2013-04-14 ENCOUNTER — Ambulatory Visit: Payer: BC Managed Care – PPO | Admitting: Genetic Counselor

## 2013-04-14 ENCOUNTER — Other Ambulatory Visit: Payer: BC Managed Care – PPO | Admitting: Lab

## 2013-04-14 NOTE — Progress Notes (Signed)
The patient did not show for her appointment.  If she is still interested in having a genetics appointment, please re-refer.

## 2013-04-18 ENCOUNTER — Other Ambulatory Visit: Payer: Self-pay | Admitting: *Deleted

## 2013-04-18 DIAGNOSIS — M546 Pain in thoracic spine: Secondary | ICD-10-CM

## 2013-04-18 DIAGNOSIS — M542 Cervicalgia: Secondary | ICD-10-CM

## 2013-04-18 DIAGNOSIS — M7918 Myalgia, other site: Secondary | ICD-10-CM

## 2013-04-18 MED ORDER — FENTANYL 12 MCG/HR TD PT72: 12.5000 ug | MEDICATED_PATCH | TRANSDERMAL | Status: DC

## 2013-04-18 MED ORDER — HYDROCODONE-ACETAMINOPHEN 5-325 MG PO TABS: 1.0000 | ORAL_TABLET | Freq: Four times a day (QID) | ORAL | Status: DC | PRN

## 2013-04-18 NOTE — Telephone Encounter (Signed)
rx printed early for controlled medication for the visit with RN on 04/25/13 (to be signed by MD) 

## 2013-04-25 ENCOUNTER — Encounter: Payer: BC Managed Care – PPO | Attending: Physical Medicine and Rehabilitation | Admitting: *Deleted

## 2013-04-25 ENCOUNTER — Encounter: Payer: Self-pay | Admitting: *Deleted

## 2013-04-25 VITALS — BP 120/78 | HR 81 | Resp 14 | Ht 63.0 in | Wt 133.0 lb

## 2013-04-25 DIAGNOSIS — R269 Unspecified abnormalities of gait and mobility: Secondary | ICD-10-CM

## 2013-04-25 DIAGNOSIS — G8929 Other chronic pain: Secondary | ICD-10-CM | POA: Insufficient documentation

## 2013-04-25 DIAGNOSIS — M542 Cervicalgia: Secondary | ICD-10-CM | POA: Insufficient documentation

## 2013-04-25 DIAGNOSIS — Z79899 Other long term (current) drug therapy: Secondary | ICD-10-CM | POA: Insufficient documentation

## 2013-04-25 DIAGNOSIS — M546 Pain in thoracic spine: Secondary | ICD-10-CM

## 2013-04-25 DIAGNOSIS — F341 Dysthymic disorder: Secondary | ICD-10-CM | POA: Insufficient documentation

## 2013-04-25 DIAGNOSIS — M7918 Myalgia, other site: Secondary | ICD-10-CM

## 2013-04-25 DIAGNOSIS — G4700 Insomnia, unspecified: Secondary | ICD-10-CM | POA: Insufficient documentation

## 2013-04-25 DIAGNOSIS — Z853 Personal history of malignant neoplasm of breast: Secondary | ICD-10-CM | POA: Insufficient documentation

## 2013-04-25 MED ORDER — METHOCARBAMOL 500 MG PO TABS: 500.0000 mg | ORAL_TABLET | Freq: Four times a day (QID) | ORAL | Status: DC

## 2013-04-25 NOTE — Progress Notes (Signed)
Here for pill count and medication refills. Hydrocodone 120 Fill date fill date is torn form bottle (child played with it) request for refill was 03/29/13 and count appropriate    Today NV#24.  Did not bring fentanyl and says she is not really using because she forgot to take them to [place].  Rx for fentanyl voided.  Follow up in one month with RN. No changes to pain level or medication list.  Methocarbamol reordered.

## 2013-04-25 NOTE — Patient Instructions (Signed)
Follow up one month with RN for pill count and med refill 

## 2013-05-23 ENCOUNTER — Other Ambulatory Visit: Payer: Self-pay | Admitting: *Deleted

## 2013-05-23 DIAGNOSIS — M546 Pain in thoracic spine: Secondary | ICD-10-CM

## 2013-05-23 DIAGNOSIS — M7918 Myalgia, other site: Secondary | ICD-10-CM

## 2013-05-23 DIAGNOSIS — M542 Cervicalgia: Secondary | ICD-10-CM

## 2013-05-23 MED ORDER — HYDROCODONE-ACETAMINOPHEN 5-325 MG PO TABS: 1.0000 | ORAL_TABLET | Freq: Four times a day (QID) | ORAL | Status: DC | PRN

## 2013-05-23 NOTE — Telephone Encounter (Signed)
RX printed early for controlled medication for the visit with RN on 05/25/13 (to be signed by MD) 

## 2013-05-30 ENCOUNTER — Encounter: Payer: Self-pay | Admitting: *Deleted

## 2013-05-30 ENCOUNTER — Encounter: Payer: BC Managed Care – PPO | Attending: Physical Medicine & Rehabilitation | Admitting: *Deleted

## 2013-05-30 VITALS — BP 115/58 | HR 63 | Resp 14

## 2013-05-30 DIAGNOSIS — G8929 Other chronic pain: Secondary | ICD-10-CM | POA: Insufficient documentation

## 2013-05-30 DIAGNOSIS — Z853 Personal history of malignant neoplasm of breast: Secondary | ICD-10-CM | POA: Insufficient documentation

## 2013-05-30 DIAGNOSIS — R259 Unspecified abnormal involuntary movements: Secondary | ICD-10-CM | POA: Insufficient documentation

## 2013-05-30 DIAGNOSIS — G4700 Insomnia, unspecified: Secondary | ICD-10-CM | POA: Insufficient documentation

## 2013-05-30 DIAGNOSIS — M542 Cervicalgia: Secondary | ICD-10-CM | POA: Insufficient documentation

## 2013-05-30 DIAGNOSIS — Z79899 Other long term (current) drug therapy: Secondary | ICD-10-CM | POA: Insufficient documentation

## 2013-05-30 DIAGNOSIS — Z5181 Encounter for therapeutic drug level monitoring: Secondary | ICD-10-CM

## 2013-05-30 DIAGNOSIS — D6800 Von Willebrand disease, unspecified: Secondary | ICD-10-CM | POA: Insufficient documentation

## 2013-05-30 DIAGNOSIS — IMO0001 Reserved for inherently not codable concepts without codable children: Secondary | ICD-10-CM | POA: Insufficient documentation

## 2013-05-30 DIAGNOSIS — N83209 Unspecified ovarian cyst, unspecified side: Secondary | ICD-10-CM | POA: Insufficient documentation

## 2013-05-30 DIAGNOSIS — G894 Chronic pain syndrome: Secondary | ICD-10-CM

## 2013-05-30 DIAGNOSIS — R269 Unspecified abnormalities of gait and mobility: Secondary | ICD-10-CM | POA: Insufficient documentation

## 2013-05-30 DIAGNOSIS — F341 Dysthymic disorder: Secondary | ICD-10-CM | POA: Insufficient documentation

## 2013-05-30 DIAGNOSIS — M546 Pain in thoracic spine: Secondary | ICD-10-CM | POA: Insufficient documentation

## 2013-05-30 DIAGNOSIS — Z923 Personal history of irradiation: Secondary | ICD-10-CM | POA: Insufficient documentation

## 2013-05-30 MED ORDER — METHOCARBAMOL 500 MG PO TABS: 500.0000 mg | ORAL_TABLET | Freq: Four times a day (QID) | ORAL | Status: DC

## 2013-05-30 MED ORDER — FENTANYL 12 MCG/HR TD PT72: 12.5000 ug | MEDICATED_PATCH | TRANSDERMAL | Status: DC

## 2013-05-30 NOTE — Patient Instructions (Signed)
follo up one month with Dr Naaman Plummer

## 2013-05-30 NOTE — Addendum Note (Signed)
Addended by: Caro Hight on: 05/30/2013 10:15 AM   Modules accepted: Level of Service

## 2013-05-30 NOTE — Addendum Note (Signed)
Addended by: Caro Hight on: 05/30/2013 10:40 AM   Modules accepted: Orders

## 2013-05-30 NOTE — Progress Notes (Addendum)
Here for pill count and medication refills. Fill date  04/30/13 # 120   Today NV# 3  Jessica Horn broght in her fentanyl patch box filled 04/04/13 # 10  (today NV# 0) which she said las visit she was not keeping up with it and had not been using so we did not refill it.  She is now wanting a refill.    Random UDS collected today and VSS   Pain level:7  A refill was given for her hydrocodone 5-325 # 120 and fentanyl 12 mcg patch # 10, and a refill for robaxin was sent directly to [facility] on [address].  CSA was signed today though she says she has signed previously - it was not in electronic record. Return to see Sr Jessica Horn in a month.

## 2013-06-29 ENCOUNTER — Telehealth: Payer: Self-pay

## 2013-06-29 ENCOUNTER — Encounter
Payer: BC Managed Care – PPO | Attending: Physical Medicine and Rehabilitation | Admitting: Physical Medicine & Rehabilitation

## 2013-06-29 ENCOUNTER — Encounter: Payer: Self-pay | Admitting: Physical Medicine & Rehabilitation

## 2013-06-29 ENCOUNTER — Encounter (INDEPENDENT_AMBULATORY_CARE_PROVIDER_SITE_OTHER): Payer: Self-pay

## 2013-06-29 VITALS — BP 113/79 | HR 97 | Resp 14 | Ht 63.0 in | Wt 128.0 lb

## 2013-06-29 DIAGNOSIS — M542 Cervicalgia: Secondary | ICD-10-CM | POA: Insufficient documentation

## 2013-06-29 DIAGNOSIS — R5381 Other malaise: Secondary | ICD-10-CM | POA: Insufficient documentation

## 2013-06-29 DIAGNOSIS — R5383 Other fatigue: Secondary | ICD-10-CM

## 2013-06-29 DIAGNOSIS — IMO0001 Reserved for inherently not codable concepts without codable children: Secondary | ICD-10-CM | POA: Insufficient documentation

## 2013-06-29 DIAGNOSIS — M7918 Myalgia, other site: Secondary | ICD-10-CM

## 2013-06-29 DIAGNOSIS — M546 Pain in thoracic spine: Secondary | ICD-10-CM

## 2013-06-29 DIAGNOSIS — D6800 Von Willebrand disease, unspecified: Secondary | ICD-10-CM

## 2013-06-29 DIAGNOSIS — G4700 Insomnia, unspecified: Secondary | ICD-10-CM

## 2013-06-29 MED ORDER — HYDROCODONE-ACETAMINOPHEN 5-325 MG PO TABS: 1.0000 | ORAL_TABLET | Freq: Four times a day (QID) | ORAL | Status: DC | PRN

## 2013-06-29 MED ORDER — QUETIAPINE FUMARATE 100 MG PO TABS: 100.0000 mg | ORAL_TABLET | Freq: Every day | ORAL | Status: DC

## 2013-06-29 MED ORDER — BACLOFEN 10 MG PO TABS: 10.0000 mg | ORAL_TABLET | Freq: Three times a day (TID) | ORAL | Status: DC | PRN

## 2013-06-29 NOTE — Telephone Encounter (Signed)
Patient called and stated that she could not get her Fentanyl patches filled because [facility] (which is patient's regular pharmacy) did not carry them. I called the patient back to inform her to try [facility], [facility] or [facility] that one of those pharmacies may have the medication in stock. Patient said she would try to get the RX to one of those pharmacies.

## 2013-06-29 NOTE — Progress Notes (Signed)
Subjective:    Patient ID: Jessica Horn, female    DOB: 03-11-1981, 33 y.o.   MRN: [id number]  HPI  Jessica Horn is back regarding her chronic pain. She recently moved within [place]. Her pain has been a little greater than usual as she's been doing most of the unpacking.   Her balance has been a little worse, which she attributes to not "eating as much". She doesn't really have an appetite, and she's not sure why.   Her sleep is still an issue. The seroquel helps her sleep 4-5 hours per night.   She denies anxiety or depression   Pain Inventory Average Pain 8 Pain Right Now 8 My pain is sharp and stabbing  In the last 24 hours, has pain interfered with the following? General activity 4 Relation with others 4 Enjoyment of life 4 What TIME of day is your pain at its worst? all day Sleep (in general) Poor  Pain is worse with: walking, bending, sitting, inactivity, standing, unsure and some activites Pain improves with: heat/ice and medication Relief from Meds: 7  Mobility walk without assistance walk with assistance use a cane how many minutes can you walk? 10 ability to climb steps?  yes do you drive?  no Do you have any goals in this area?  no  Function not employed: date last employed na I need assistance with the following:  bathing, household duties and shopping Do you have any goals in this area?  no  Neuro/Psych No problems in this area  Prior Studies Any changes since last visit?  no  Physicians involved in your care Any changes since last visit?  no   Family History  Problem Relation Age of Onset  . Hypertension Mother   . Anemia Mother   . Cancer Maternal Aunt     breast  . Kidney disease Maternal Grandmother   . Diabetes Maternal Grandmother   . Diabetes Paternal Grandfather   . Lupus Other   . Multiple sclerosis Other    History   Social History  . Marital Status: Married    Spouse Name: N/A    Number of Children: N/A  . Years  of Education: N/A   Social History Main Topics  . Smoking status: Never Smoker   . Smokeless tobacco: None  . Alcohol Use: 0.6 oz/week    1 Glasses of wine per week  . Drug Use: No  . Sexual Activity: Yes   Other Topics Concern  . None   Social History Narrative  . None   Past Surgical History  Procedure Laterality Date  . Tubal ligation    . Cesarean section    . Ovarian cyst removal     Past Medical History  Diagnosis Date  . Fibromyalgia   . Back pain   . Ovarian cyst   . Cancer     breast CA at 33 y/o  . Cancer of spine     spine CA for past 10 years  . Breast cancer   . Depression    BP 113/79  Pulse 97  Resp 14  Ht 5\' 3"  (1.6 m)  Wt 128 lb (58.06 kg)  BMI 22.68 kg/m2  SpO2 98%  Opioid Risk Score:   Fall Risk Score: High Fall Risk (>13 points) (pt educated on fall risk, declined brochure.)    Review of Systems  All other systems reviewed and are negative.       Objective:   Physical Exam General: Alert and oriented  x 3, Appears generally uncomfortable.  HEENT: Head is normocephalic, atraumatic, PERRLA, EOMI, sclera anicteric, oral mucosa pink and moist, dentition intact, ext ear canals clear,  Neck: Supple without JVD or lymphadenopathy  Heart: Reg rate and rhythm. No murmurs rubs or gallops  Chest: CTA bilaterally without wheezes, rales, or rhonchi; no distress  Abdomen: Soft, non-tender, non-distended, bowel sounds positive.  Extremities: No clubbing, cyanosis, or edema. Pulses are 2+  Skin: Clean and intact without signs of breakdown  Neuro: Pt is cognitively appropriate with normal insight, memory, and awareness. Occasionally had mild attention issues. Cranial nerves 2-12 are intact. I saw no nystagmus. Sensory exam is normal for PP and LT although proprioception was decreased in both legs, feet seemed to be somewhat intact. She had diminished [place] of both arms and legs and had difficulty with heel to shin, finger to nose and rapid alternating  movements. . She has intentional tremor in both hands. Reflexes are 2+ in all 4's. Motor function is grossly 5/5 in UE's with pain inhibition. She displayed 4/5 strength in both lower extremities, perhaps 4- proximally. She is using a cane for balance.  Musculoskeletal: posture was fair. She continues to have a bit of a head forward position. Tender points/areas were less prominent. Having axial pain. Psych: Pt was calm and relaxed today.   Assessment & Plan:   1. Chronic cervico-thoracic axial spine pain. Origin could be multifactorial given her history, although recent xrays were unremarkable. This appears generally myofascial in nature with her posture/scoliosis also playing a role  2. 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  3. Depression with anxiety (PTSD?)  4. Insomnia--persistent  5. Hx of breast cancer rx'ed in 2004? With CTX and XRT  6. Hx of Von Willebrand's disease     Plan:  1. Will change her from robaxin to baclofen for her pain control. #75.  2. She missed her geneticist appt in December. She was unaware that this was so. She will attempt to reschedule.  3. Will increase the seroquel to 100mg  qhs to see if this improves sleep, appetite. 4. Pain psychology referral at some point? 7. HEP--to build up posture, core. I believe this is very important for her. 8. Follow up with me in two months. 15 min of face to face patient care time was spent during this visit. All questions were encouraged and answered.  9. Fentanyl patch 108mcg was not required. I am OK with her "weaning" this so to speak where she uses a patch only when her pain flares and keeps it on for three days thereafter. The idea will be to wean off this completely. I refilled her hydrocodone with a second rx for next month. 10. Asked pt to follow up with oncology regarding her VWD as I was unaware that she had this disease.

## 2013-06-29 NOTE — Patient Instructions (Signed)
WORK ON CORE MUSCLE STRENGTHENING AND CARDIO ACTIVITIES.

## 2013-07-04 ENCOUNTER — Telehealth: Payer: Self-pay | Admitting: Oncology

## 2013-07-04 NOTE — Telephone Encounter (Signed)
, °

## 2013-07-05 ENCOUNTER — Telehealth: Payer: Self-pay | Admitting: Oncology

## 2013-07-05 NOTE — Telephone Encounter (Signed)
, °

## 2013-07-07 ENCOUNTER — Encounter: Payer: BC Managed Care – PPO | Admitting: Genetic Counselor

## 2013-07-07 ENCOUNTER — Other Ambulatory Visit: Payer: BC Managed Care – PPO

## 2013-07-11 ENCOUNTER — Encounter: Payer: BC Managed Care – PPO | Admitting: Genetic Counselor

## 2013-08-01 ENCOUNTER — Encounter: Payer: Self-pay | Admitting: Genetic Counselor

## 2013-08-01 ENCOUNTER — Ambulatory Visit: Payer: BC Managed Care – PPO

## 2013-08-01 ENCOUNTER — Ambulatory Visit (HOSPITAL_BASED_OUTPATIENT_CLINIC_OR_DEPARTMENT_OTHER): Payer: BC Managed Care – PPO | Admitting: Genetic Counselor

## 2013-08-01 DIAGNOSIS — Z803 Family history of malignant neoplasm of breast: Secondary | ICD-10-CM

## 2013-08-01 DIAGNOSIS — Z853 Personal history of malignant neoplasm of breast: Secondary | ICD-10-CM

## 2013-08-01 DIAGNOSIS — Z8041 Family history of malignant neoplasm of ovary: Secondary | ICD-10-CM

## 2013-08-01 DIAGNOSIS — IMO0002 Reserved for concepts with insufficient information to code with codable children: Secondary | ICD-10-CM

## 2013-08-01 NOTE — Progress Notes (Signed)
Dr.  Marcy Panning requested a consultation for genetic counseling and risk assessment for Jessica Horn, a 33 y.o. female, for discussion of her personal history of breast cancer and family history of breast and ovarian cancer.  She presents to clinic today to discuss the possibility of a genetic predisposition to cancer, and to further clarify her risks, as well as her family members' risks for cancer.   HISTORY OF PRESENT ILLNESS: In 2004, at the age of 37, Jessica Horn was diagnosed with breast cancer.  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  She has a newspaper article, she states, but did not have it with her.  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  She indicates that she had a colonoscopy about 10 years ago, but does not know why or if anything was found. It was part of the required testing for Olympic training, she reports.   Past Medical History  Diagnosis Date  . Fibromyalgia   . Back pain   . Ovarian cyst   . Cancer     breast CA at 33 y/o  . Cancer of spine     spine CA for past 10 years  . Breast cancer   . Depression     Past Surgical History  Procedure Laterality Date  . Tubal ligation    . Cesarean section    . Ovarian cyst removal      History   Social History  . Marital Status: Married    Spouse Name: N/A    Number of Children: 1  . Years of Education: N/A   Social History Main Topics  . Smoking status: Never Smoker   . Smokeless tobacco: None  . Alcohol Use: 0.6 oz/week    1 Glasses of wine per week  . Drug Use: No  . Sexual Activity: Yes   Other Topics Concern  . None   Social History Narrative  . None    REPRODUCTIVE HISTORY AND PERSONAL RISK ASSESSMENT FACTORS: Menarche was at age 83.    premenopausal Uterus Intact: yes Ovaries Intact: yes G1P1A0, first live birth at age 53  She has not previously undergone treatment for infertility.   Oral Contraceptive use: 5 years   She has not used HRT in the past.    FAMILY HISTORY:  We obtained a detailed, 4-generation family history.  Significant diagnoses are listed below: Family History  Problem Relation Age of Onset  . Hypertension Mother   . Anemia Mother   . Cancer Maternal Aunt     breast  . Kidney disease Maternal Grandmother   . Diabetes Maternal Grandmother   . Breast cancer Maternal Grandmother 78  . Diabetes Paternal Grandfather   . Lupus Other   . Multiple sclerosis Other   . Breast cancer Sister 75  . Breast cancer Paternal Aunt 5  . Diabetes Maternal Grandfather   . Cancer Paternal Grandmother     NOS  . Breast cancer Paternal Aunt 29  . Breast cancer Paternal Aunt   . Ovarian cancer Paternal Aunt     Patient's maternal ancestors are of [place] and [place] American descent, and paternal ancestors are of [place] descent. There is no reported Ashkenazi [place]  ancestry. There is no known consanguinity.  GENETIC COUNSELING ASSESSMENT: Jessica Horn is a 33 y.o. female with a personal and family history of breast cancer which somewhat suggestive of a hereditary cancer syndrome and predisposition to cancer. We, therefore, discussed and recommended the following at today's visit.   DISCUSSION: We reviewed the characteristics, features and inheritance patterns of hereditary cancer syndromes. We also discussed genetic testing, including the appropriate family members to test, the process of testing, insurance coverage and turn-around-time for results. We discussed that the most common reason why someone would have a hereditary cancer syndrome is because they have a BRCA mutation.  Women under 35 who are negative for BRCA mutations, have a higher incidence of TP53 mutations, so we should also do  testing for that.  PLAN: After considering the risks, benefits, and limitations, Elzora Cullins provided informed consent to pursue genetic testing and the blood sample will be sent to [facility] for analysis of the Breast/Ovarian Cancer syndrome. We discussed the implications of a positive, negative and/ or variant of uncertain significance genetic test result. Results should be available within approximately 3 weeks' time, at which point they will be disclosed by telephone to Elvis Coil, as will any additional recommendations warranted by these results. Deshonda Cryderman will receive a summary of her genetic counseling visit and a copy of her results once available. This information will also be available in Epic. We encouraged Akiva Josey to remain in contact with cancer genetics annually so that we can continuously update the family history and inform her of any changes in cancer genetics and testing that may be of benefit for her family. Quintasha Macbride's questions were answered to her satisfaction today. Our contact information was provided should additional questions or concerns arise.  The patient was seen for a total of 60 minutes, greater than 50% of which was spent face-to-face counseling.  This note will also be sent to the referring provider via the electronic medical record. The patient will be supplied with a summary of this genetic counseling discussion as well as educational information on the discussed hereditary cancer syndromes following the conclusion of their visit.   Patient was discussed with Dr. Marcy Panning.   _______________________________________________________________________ For Office Staff:  Number of people involved in session: 1 Was an Intern/ student involved with case: yes

## 2013-08-21 ENCOUNTER — Encounter: Payer: Self-pay | Admitting: Genetic Counselor

## 2013-08-21 NOTE — Progress Notes (Signed)
HPI:  Ms. Callegari was previously seen in the [facility] due to a personal and family history of cancer and concerns regarding a hereditary predisposition to cancer. Please refer to our prior cancer genetics clinic note for more information regarding Ms. Baughman's medical, social and family histories, and our assessment and recommendations, at the time. Ms. Vantol recent genetic test results were disclosed to her, as were recommendations warranted by these results. These results and recommendations are discussed in more detail below.  GENETIC TEST RESULTS: At the time of Ms. Dieterich's visit, we recommended she pursue genetic testing of the Breast/Ovarian cancer gene panel. This test, which included sequencing and deletion/duplication analysis of the genes listed on the test report, was performed at [facility]. Genetic testing was normal, and did not reveal a mutation in these genes. A complete list of all genes tested is located on the test report scanned into EPIC.    We discussed with Ms. Ladouceur that since the current genetic testing is not perfect, it is possible there may be a gene mutation in one of these genes that current testing cannot detect, but that chance is small.  We also discussed, that it is possible that another gene that has not yet been discovered, or that we have not yet tested, is responsible for the cancer diagnoses in the family, and it is, therefore, important to remain in touch with cancer genetics in the future so that we can continue to offer Ms. Blakley the most up to date genetic testing.   CANCER SCREENING RECOMMENDATIONS: Given the personal and family histories, we must interpret these negative results with some caution.  Families with features suggestive of hereditary risk tend to have multiple family members with cancer, diagnoses in multiple generations and diagnoses before the age of 69. Ms. Cudmore family exhibits  some of these features. Thus, this result may simply reflect our current inability to detect all mutations within these genes, or as mentioned above, may be a different gene that has not yet been discovered or tested.   We, therefore, feel it is reasonable for Ms. Haltiwanger to be followed by a high-risk breast clinic; in addition to a yearly mammogram and physical exam twice per year, she should discuss the usefulness of an annual breast MRI with the high-risk clinic providers. A transvaginal ultrasound and a CA-125 blood test can be considered for ovarian cancer screening, although these tests have not been shown to be effective in detecting this cancer early.  Thus, some women at increased risk of ovarian cancer choose to undergo removal of the ovaries and fallopian tubes. We recommend Ms. Russell also discuss these options further with her gynecologist and/or primary providers.  RECOMMENDATIONS FOR FAMILY MEMBERS: Given the family history, Ms. Budlong's relatives may also be at increased risk for cancer over the general population.  Female relatives in this family should consider being followed at a high-risk breast clinic and have a yearly clinical breast exam, mammogram and perform monthly breast self-exams.These relatives should discuss the utility of breast MRI screening with their own healthcare provider, and also discuss with their gynecologists ovarian cancer screening and surgical options available. Women in this family should also have a gynecological exam as recommended by their primary provider. All family members should have a colonoscopy by age 27.  FOLLOW-UP: Lastly, we discussed with Ms. Veillon that cancer genetics is a rapidly advancing field and it is possible that new genetic tests will be appropriate for her and/or her  family members in the future. We encouraged her to remain in contact with cancer genetics on an annual basis so we can update her personal and family histories  and let her know of advances in cancer genetics that may benefit this family.   Our contact number was provided. Ms. Shor questions were answered to her satisfaction, and she knows she is welcome to call us at anytime with additional questions or concerns. This patient was discussed with Dr. Humphrey Rolls who agrees with the above.   Catherine A. Fine, MS, CGC Certified Genetic Counseor phone: [phone] [email or web address]

## 2013-08-22 ENCOUNTER — Other Ambulatory Visit: Payer: Self-pay | Admitting: *Deleted

## 2013-08-22 DIAGNOSIS — M542 Cervicalgia: Secondary | ICD-10-CM

## 2013-08-22 DIAGNOSIS — M7918 Myalgia, other site: Secondary | ICD-10-CM

## 2013-08-22 DIAGNOSIS — M546 Pain in thoracic spine: Secondary | ICD-10-CM

## 2013-08-22 MED ORDER — HYDROCODONE-ACETAMINOPHEN 5-325 MG PO TABS: 1.0000 | ORAL_TABLET | Freq: Four times a day (QID) | ORAL | Status: DC | PRN

## 2013-08-22 NOTE — Telephone Encounter (Signed)
CII rx printed for md to sign for RN med refill appt

## 2013-08-24 ENCOUNTER — Telehealth: Payer: Self-pay | Admitting: *Deleted

## 2013-08-24 ENCOUNTER — Encounter: Payer: BC Managed Care – PPO | Attending: Physical Medicine and Rehabilitation | Admitting: *Deleted

## 2013-08-24 ENCOUNTER — Encounter: Payer: Self-pay | Admitting: *Deleted

## 2013-08-24 VITALS — BP 110/72 | HR 95 | Resp 14

## 2013-08-24 DIAGNOSIS — M546 Pain in thoracic spine: Secondary | ICD-10-CM | POA: Insufficient documentation

## 2013-08-24 DIAGNOSIS — IMO0001 Reserved for inherently not codable concepts without codable children: Secondary | ICD-10-CM | POA: Insufficient documentation

## 2013-08-24 DIAGNOSIS — G894 Chronic pain syndrome: Secondary | ICD-10-CM

## 2013-08-24 DIAGNOSIS — G4700 Insomnia, unspecified: Secondary | ICD-10-CM | POA: Insufficient documentation

## 2013-08-24 DIAGNOSIS — M7918 Myalgia, other site: Secondary | ICD-10-CM

## 2013-08-24 DIAGNOSIS — R5383 Other fatigue: Secondary | ICD-10-CM

## 2013-08-24 DIAGNOSIS — M542 Cervicalgia: Secondary | ICD-10-CM | POA: Insufficient documentation

## 2013-08-24 DIAGNOSIS — R5381 Other malaise: Secondary | ICD-10-CM | POA: Insufficient documentation

## 2013-08-24 MED ORDER — METHOCARBAMOL 500 MG PO TABS: 500.0000 mg | ORAL_TABLET | Freq: Four times a day (QID) | ORAL | Status: DC

## 2013-08-24 NOTE — Telephone Encounter (Signed)
Jessica Horn in to see me for her med refill.  Baclofen not working and she is wanting to go back to the methocarbamol, so I d/c'd the baclofen and reordered the methocarbamol as she was taking previously.  Is this ok?

## 2013-08-24 NOTE — Progress Notes (Signed)
Here for pill count and medication refills.Hydrocodone  5/325 #120 Fill date 07/24/13   Today NV#0  Appropriate   VSS  She says that the baclofen they tried last month is not helping and she is wanting to go back to the methocarbamol. I have d/c'd the baclofen and reordered the methocarbamol as was previously prescribed.  She has fallen since the last visit.  She has been educated and handout given but she has balance issues that are not related to falling over things or tripping.  She can call next month for a refill on her hydrocodone and then see Dr Naaman Plummer in 2 months.

## 2013-08-24 NOTE — Telephone Encounter (Signed)
That's fine

## 2013-08-24 NOTE — Patient Instructions (Addendum)
May call for refill next month and 2 months with Dr Naaman Plummer

## 2013-08-26 ENCOUNTER — Ambulatory Visit: Payer: BC Managed Care – PPO | Admitting: Physical Medicine & Rehabilitation

## 2013-09-15 ENCOUNTER — Encounter: Payer: BC Managed Care – PPO | Admitting: Genetic Counselor

## 2013-09-19 ENCOUNTER — Telehealth: Payer: Self-pay

## 2013-09-19 DIAGNOSIS — M546 Pain in thoracic spine: Secondary | ICD-10-CM

## 2013-09-19 DIAGNOSIS — M542 Cervicalgia: Secondary | ICD-10-CM

## 2013-09-19 DIAGNOSIS — M7918 Myalgia, other site: Secondary | ICD-10-CM

## 2013-09-19 MED ORDER — HYDROCODONE-ACETAMINOPHEN 5-325 MG PO TABS: 1.0000 | ORAL_TABLET | Freq: Four times a day (QID) | ORAL | Status: DC | PRN

## 2013-09-19 NOTE — Telephone Encounter (Signed)
Patient is requesting a refill on Hydrocodone. Last fill date was 4/13. Next appt is 6/12. RX printed for Dr. Naaman Plummer to sign. Will contact patient when ready for pickup.

## 2013-09-19 NOTE — Telephone Encounter (Signed)
Patient informed she can pick up her hydrocodone rx.

## 2013-10-18 ENCOUNTER — Telehealth: Payer: Self-pay

## 2013-10-18 DIAGNOSIS — M542 Cervicalgia: Secondary | ICD-10-CM

## 2013-10-18 DIAGNOSIS — M7918 Myalgia, other site: Secondary | ICD-10-CM

## 2013-10-18 DIAGNOSIS — M546 Pain in thoracic spine: Secondary | ICD-10-CM

## 2013-10-18 NOTE — Telephone Encounter (Signed)
May fill this time. Won't fill again without a visit

## 2013-10-18 NOTE — Telephone Encounter (Signed)
Patient called requesting a refill on Hydrocodone. After reviewing the chart patient has not been seen since 08/24/13. Informed the patient that she needed a appt. Patient states she talked over with her husband and she will not pay $35 to see a "nurses face." The next time she comes in it will be to see Dr. Charm Barges face only. Offered the patient a appt with Dr. Naaman Plummer on 6/9. She said "that is not enough notice" Informed patient that I would need to get approval if appropriate from Dr. Naaman Plummer and we would notify her. Patient was rude when I informed her and said "uhh okay then what?" Please advise.

## 2013-10-19 MED ORDER — HYDROCODONE-ACETAMINOPHEN 5-325 MG PO TABS: 1.0000 | ORAL_TABLET | Freq: Four times a day (QID) | ORAL | Status: DC | PRN

## 2013-10-19 MED ORDER — METHOCARBAMOL 500 MG PO TABS: 500.0000 mg | ORAL_TABLET | Freq: Four times a day (QID) | ORAL | Status: DC

## 2013-10-19 NOTE — Telephone Encounter (Signed)
Notified Jessica Horn that her rx was available and she wanted to know if the methocarbamol was refilled.  I refilled this electronically.

## 2013-10-19 NOTE — Telephone Encounter (Signed)
Per Dr. Naaman Plummer Hydrocodone RX printed for him to sign. Will contact patient when ready for pickup. Dr. Naaman Plummer will not refill medication again without a visit.

## 2013-10-21 ENCOUNTER — Ambulatory Visit: Payer: BC Managed Care – PPO | Admitting: Physical Medicine & Rehabilitation

## 2013-11-09 ENCOUNTER — Ambulatory Visit: Payer: BC Managed Care – PPO | Admitting: Registered Nurse

## 2013-11-14 ENCOUNTER — Telehealth: Payer: Self-pay | Admitting: *Deleted

## 2013-11-14 DIAGNOSIS — M542 Cervicalgia: Secondary | ICD-10-CM

## 2013-11-14 DIAGNOSIS — M7918 Myalgia, other site: Secondary | ICD-10-CM

## 2013-11-14 DIAGNOSIS — M546 Pain in thoracic spine: Secondary | ICD-10-CM

## 2013-11-14 MED ORDER — HYDROCODONE-ACETAMINOPHEN 5-325 MG PO TABS: 1.0000 | ORAL_TABLET | Freq: Four times a day (QID) | ORAL | Status: DC | PRN

## 2013-11-14 NOTE — Telephone Encounter (Signed)
Notified rx available for pick up 

## 2013-11-14 NOTE — Telephone Encounter (Signed)
Needs refill on her medication.  She does not have an appointment until August.  Every time she gets an appointment our clinic calls and wants to reschedule.  She wants to be called if there is a cancellation.  He actually has two cancellations today but she says she can't come with this short of notice because she does not drive.  She needs a couple of days heads up. She does not want to see anyone but Dr Naaman Plummer at this point "since he is my doctor".  Rx printed for Dr Naaman Plummer to sign

## 2013-12-01 ENCOUNTER — Telehealth: Payer: Self-pay

## 2013-12-01 NOTE — Telephone Encounter (Signed)
Spoke with patient, offered appointment for 07/24 but she refused due to short notice.

## 2013-12-06 ENCOUNTER — Telehealth: Payer: Self-pay

## 2013-12-06 DIAGNOSIS — M542 Cervicalgia: Secondary | ICD-10-CM

## 2013-12-06 DIAGNOSIS — M7918 Myalgia, other site: Secondary | ICD-10-CM

## 2013-12-06 DIAGNOSIS — M546 Pain in thoracic spine: Secondary | ICD-10-CM

## 2013-12-06 NOTE — Telephone Encounter (Signed)
Patient is requesting a refill on Hydrocodone. She is going on vacation on 8/2 and will be out of medication while gone.

## 2013-12-07 ENCOUNTER — Other Ambulatory Visit: Payer: Self-pay | Admitting: Physical Medicine & Rehabilitation

## 2013-12-07 MED ORDER — HYDROCODONE-ACETAMINOPHEN 5-325 MG PO TABS: 1.0000 | ORAL_TABLET | Freq: Four times a day (QID) | ORAL | Status: DC | PRN

## 2013-12-07 NOTE — Telephone Encounter (Signed)
Patient is requesting a refill on Methocarbamol. Patient is going on vacation and will run out while gone.  Refilled sent to pharmacy. Patient is aware.

## 2013-12-07 NOTE — Telephone Encounter (Signed)
rx printed for Dr Naaman Plummer to sign.  She has an appt on 12/28/13 with him

## 2013-12-07 NOTE — Telephone Encounter (Signed)
Attempted to contact patient to inform her the Hydrocodone RX is ready for pickup. Left a voicemail.

## 2013-12-10 ENCOUNTER — Encounter (HOSPITAL_COMMUNITY): Payer: Self-pay | Admitting: Emergency Medicine

## 2013-12-10 ENCOUNTER — Emergency Department (HOSPITAL_COMMUNITY)
Admission: EM | Admit: 2013-12-10 | Discharge: 2013-12-10 | Disposition: A | Discharge status: Home / Self Care | Payer: BC Managed Care – PPO | Attending: Emergency Medicine | Admitting: Emergency Medicine

## 2013-12-10 ENCOUNTER — Emergency Department (HOSPITAL_COMMUNITY): Payer: BC Managed Care – PPO

## 2013-12-10 DIAGNOSIS — Z8739 Personal history of other diseases of the musculoskeletal system and connective tissue: Secondary | ICD-10-CM | POA: Insufficient documentation

## 2013-12-10 DIAGNOSIS — Z8589 Personal history of malignant neoplasm of other organs and systems: Secondary | ICD-10-CM | POA: Insufficient documentation

## 2013-12-10 DIAGNOSIS — N9489 Other specified conditions associated with female genital organs and menstrual cycle: Secondary | ICD-10-CM

## 2013-12-10 DIAGNOSIS — Z853 Personal history of malignant neoplasm of breast: Secondary | ICD-10-CM | POA: Insufficient documentation

## 2013-12-10 DIAGNOSIS — R1032 Left lower quadrant pain: Secondary | ICD-10-CM | POA: Insufficient documentation

## 2013-12-10 DIAGNOSIS — Z3202 Encounter for pregnancy test, result negative: Secondary | ICD-10-CM | POA: Insufficient documentation

## 2013-12-10 DIAGNOSIS — Z9889 Other specified postprocedural states: Secondary | ICD-10-CM | POA: Insufficient documentation

## 2013-12-10 DIAGNOSIS — Z9851 Tubal ligation status: Secondary | ICD-10-CM | POA: Insufficient documentation

## 2013-12-10 DIAGNOSIS — F3289 Other specified depressive episodes: Secondary | ICD-10-CM | POA: Insufficient documentation

## 2013-12-10 DIAGNOSIS — F329 Major depressive disorder, single episode, unspecified: Secondary | ICD-10-CM | POA: Insufficient documentation

## 2013-12-10 DIAGNOSIS — Z79899 Other long term (current) drug therapy: Secondary | ICD-10-CM | POA: Insufficient documentation

## 2013-12-10 LAB — URINALYSIS, ROUTINE W REFLEX MICROSCOPIC
BILIRUBIN URINE: NEGATIVE
GLUCOSE, UA: NEGATIVE mg/dL
HGB URINE DIPSTICK: NEGATIVE
Ketones, ur: NEGATIVE mg/dL
Leukocytes, UA: NEGATIVE
Nitrite: NEGATIVE
PH: 6 (ref 5.0–8.0)
Protein, ur: NEGATIVE mg/dL
SPECIFIC GRAVITY, URINE: 1.011 (ref 1.005–1.030)
UROBILINOGEN UA: 0.2 mg/dL (ref 0.0–1.0)

## 2013-12-10 LAB — WET PREP, GENITAL
Clue Cells Wet Prep HPF POC: NONE SEEN
Trich, Wet Prep: NONE SEEN
WBC WET PREP: NONE SEEN
YEAST WET PREP: NONE SEEN

## 2013-12-10 LAB — PREGNANCY, URINE: Preg Test, Ur: NEGATIVE

## 2013-12-10 NOTE — ED Notes (Signed)
Pt. Stated, I had a sharpe pain in my left side that lasted for a hour and went away.  Lasted about 12 minutes and went away.

## 2013-12-10 NOTE — ED Notes (Signed)
Pelvic cart is set up in pt room  

## 2013-12-10 NOTE — ED Notes (Signed)
Pt stated that she had left sided abdominal pain that lasted about 26mins. Denies any n/v/d or trouble with urinating. Currently denies any pain at this time other that chronic back pain.

## 2013-12-10 NOTE — Discharge Instructions (Signed)
Please follow with your primary care doctor in the next 2 days for a check-up. They must obtain records for further management.  ° °Do not hesitate to return to the Emergency Department for any new, worsening or concerning symptoms.  ° °

## 2013-12-10 NOTE — ED Provider Notes (Signed)
CSN: [id number]     Arrival date & time 12/10/13  2119 History   First MD Initiated Contact with Patient 12/10/13 1036     Chief Complaint  Patient presents with  . Abdominal Pain     (Consider location/radiation/quality/duration/timing/severity/associated sxs/prior Treatment) HPI  Jessica Horn is a 33 y.o. female complaining of acute onset of left lower quadrant pain this morning lasting about 15 minutes, it was severe and she couldn't and called out for help the pain was found. Patient was in her normal state of health leading up to this, pain eased off spontaneously, has not recurred since the episode approximately an hour and a half ago. Past medical history significant for ovarian cysts. Patient denies any vaginal bleeding, abnormal vaginal discharge, nausea, vomiting, change in bowel or bladder habits. Last menstrual period was 4 weeks ago, generally irregular. Patient denies any abdominal pain now but endorses her chronic low back pain secondary to spinal cancer.  Past Medical History  Diagnosis Date  . Fibromyalgia   . Back pain   . Ovarian cyst   . Cancer     breast CA at 33 y/o  . Cancer of spine     spine CA for past 10 years  . Breast cancer   . Depression    Past Surgical History  Procedure Laterality Date  . Tubal ligation    . Cesarean section    . Ovarian cyst removal     Family History  Problem Relation Age of Onset  . Hypertension Mother   . Anemia Mother   . Cancer Maternal Aunt     breast  . Kidney disease Maternal Grandmother   . Diabetes Maternal Grandmother   . Breast cancer Maternal Grandmother 78  . Diabetes Paternal Grandfather   . Lupus Other   . Multiple sclerosis Other   . Breast cancer Sister 38  . Breast cancer Paternal Aunt 54  . Diabetes Maternal Grandfather   . Cancer Paternal Grandmother     NOS  . Breast cancer Paternal Aunt 1  . Breast cancer Paternal Aunt   . Ovarian cancer Paternal Aunt    History  Substance Use Topics   . Smoking status: Never Smoker   . Smokeless tobacco: Not on file  . Alcohol Use: No   OB History   Grav Para Term Preterm Abortions TAB SAB Ect Mult Living   3 1  1 2  2   1      Review of Systems  10 systems reviewed and found to be negative, except as noted in the HPI.   Allergies  Review of patient's allergies indicates no known allergies.  Home Medications   Prior to Admission medications   Medication Sig Start Date End Date Taking? Authorizing Provider  HYDROcodone-acetaminophen (NORCO/VICODIN) 5-325 MG per tablet Take 1 tablet by mouth every 6 (six) hours as needed. 12/07/13  Yes Meredith Staggers, MD  methocarbamol (ROBAXIN) 500 MG tablet Take 500 mg by mouth 4 (four) times daily.   Yes Historical Provider, MD  QUEtiapine (SEROQUEL) 100 MG tablet Take 1 tablet (100 mg total) by mouth at bedtime. 06/29/13  Yes Meredith Staggers, MD   BP 106/75  Pulse 76  Temp(Src) 99.5 F (37.5 C) (Oral)  Resp 13  Ht 5\' 3"  (1.6 m)  Wt 145 lb (65.772 kg)  BMI 25.69 kg/m2  SpO2 100%  LMP 11/09/2013 Physical Exam  Nursing note and vitals reviewed. Constitutional: She is oriented to person, place, and time. She  appears well-developed and well-nourished. No distress.  HENT:  Head: Normocephalic and atraumatic.  Mouth/Throat: Oropharynx is clear and moist.  Eyes: Conjunctivae and EOM are normal. Pupils are equal, round, and reactive to light.  Neck: Normal range of motion.  Cardiovascular: Normal rate, regular rhythm and intact distal pulses.   Pulmonary/Chest: Effort normal and breath sounds normal. No stridor.  Abdominal: Soft. Bowel sounds are normal. She exhibits no distension and no mass. There is no tenderness. There is no rebound and no guarding.  Genitourinary:  Pelvic exam chaperoned by technician:  No rashes or lesions, no abnormal vaginal discharge, no cervical motion or adnexal tenderness  Musculoskeletal: Normal range of motion.  Neurological: She is alert and oriented to  person, place, and time.  Psychiatric: She has a normal mood and affect.    ED Course  Procedures (including critical care time) Labs Review Labs Reviewed  URINALYSIS, ROUTINE W REFLEX MICROSCOPIC - Abnormal; Notable for the following:    Color, Urine STRAW (*)    All other components within normal limits  WET PREP, GENITAL  GC/CHLAMYDIA PROBE AMP  PREGNANCY, URINE    Imaging Review US Transvaginal Non-ob  12/10/2013   CLINICAL DATA:  Left lower quadrant pelvic pain  EXAM: TRANSABDOMINAL AND TRANSVAGINAL ULTRASOUND OF PELVIS  DOPPLER ULTRASOUND OF OVARIES  TECHNIQUE: Both transabdominal and transvaginal ultrasound examinations of the pelvis were performed. Transabdominal technique was performed for global imaging of the pelvis including uterus, ovaries, adnexal regions, and pelvic cul-de-sac.  It was necessary to proceed with endovaginal exam following the transabdominal exam to visualize the endometrium and ovaries. Color and duplex Doppler ultrasound was utilized to evaluate blood flow to the ovaries.  COMPARISON:  01/18/2013  FINDINGS: Uterus  Measurements: 7.4 x 5.4 x 4.5 cm. Retroverted, retroflexed. No fibroids or other mass visualized.  Endometrium  Thickness: 12 mm. Uniformly trilaminar in appearance without focal abnormality.  Right ovary  Measurements: 4.3 x 2.7 x 2.5 cm. Probable collapsing physiologic cyst measuring 2.3 x 1.7 x 1.5 cm.  Left ovary  Measurements: 4.1 x 3.0 x 2.3 cm. Again noted is a complex lesion with an echogenic component and internal cystic component with dot-dash-type echo configuration, measuring overall 3.2 x 3.1 x 2.5 cm.  Pulsed Doppler evaluation of both ovaries demonstrates normal low-resistance arterial and venous waveforms, although there is suboptimal visualization of venous waveforms in the right ovary, likely technical.  Other findings  Trace free fluid in the cul-de-sac  IMPRESSION: Probable collapsing physiologic right ovarian cyst. If the patient has  right-sided symptoms, then consider followup pelvic ultrasound to document resolution in 4-6 weeks following the patient's next menses.  Complex left ovarian mass, stable, with imaging features highly suggestive of a dermoid. In general, surgical resection is considered due to potential for malignant degeneration.  No acute abnormality or sonographic evidence for ovarian torsion.   Electronically Signed   By: Conchita Paris M.D.   On: 12/10/2013 12:17   US Pelvis Complete  12/10/2013   CLINICAL DATA:  Left lower quadrant pelvic pain  EXAM: TRANSABDOMINAL AND TRANSVAGINAL ULTRASOUND OF PELVIS  DOPPLER ULTRASOUND OF OVARIES  TECHNIQUE: Both transabdominal and transvaginal ultrasound examinations of the pelvis were performed. Transabdominal technique was performed for global imaging of the pelvis including uterus, ovaries, adnexal regions, and pelvic cul-de-sac.  It was necessary to proceed with endovaginal exam following the transabdominal exam to visualize the endometrium and ovaries. Color and duplex Doppler ultrasound was utilized to evaluate blood flow to the ovaries.  COMPARISON:  01/18/2013  FINDINGS: Uterus  Measurements: 7.4 x 5.4 x 4.5 cm. Retroverted, retroflexed. No fibroids or other mass visualized.  Endometrium  Thickness: 12 mm. Uniformly trilaminar in appearance without focal abnormality.  Right ovary  Measurements: 4.3 x 2.7 x 2.5 cm. Probable collapsing physiologic cyst measuring 2.3 x 1.7 x 1.5 cm.  Left ovary  Measurements: 4.1 x 3.0 x 2.3 cm. Again noted is a complex lesion with an echogenic component and internal cystic component with dot-dash-type echo configuration, measuring overall 3.2 x 3.1 x 2.5 cm.  Pulsed Doppler evaluation of both ovaries demonstrates normal low-resistance arterial and venous waveforms, although there is suboptimal visualization of venous waveforms in the right ovary, likely technical.  Other findings  Trace free fluid in the cul-de-sac  IMPRESSION: Probable collapsing  physiologic right ovarian cyst. If the patient has right-sided symptoms, then consider followup pelvic ultrasound to document resolution in 4-6 weeks following the patient's next menses.  Complex left ovarian mass, stable, with imaging features highly suggestive of a dermoid. In general, surgical resection is considered due to potential for malignant degeneration.  No acute abnormality or sonographic evidence for ovarian torsion.   Electronically Signed   By: Conchita Paris M.D.   On: 12/10/2013 12:17   [place] Art/ven Flow Abd Pelv Doppler  12/10/2013   CLINICAL DATA:  Left lower quadrant pelvic pain  EXAM: TRANSABDOMINAL AND TRANSVAGINAL ULTRASOUND OF PELVIS  DOPPLER ULTRASOUND OF OVARIES  TECHNIQUE: Both transabdominal and transvaginal ultrasound examinations of the pelvis were performed. Transabdominal technique was performed for global imaging of the pelvis including uterus, ovaries, adnexal regions, and pelvic cul-de-sac.  It was necessary to proceed with endovaginal exam following the transabdominal exam to visualize the endometrium and ovaries. Color and duplex Doppler ultrasound was utilized to evaluate blood flow to the ovaries.  COMPARISON:  01/18/2013  FINDINGS: Uterus  Measurements: 7.4 x 5.4 x 4.5 cm. Retroverted, retroflexed. No fibroids or other mass visualized.  Endometrium  Thickness: 12 mm. Uniformly trilaminar in appearance without focal abnormality.  Right ovary  Measurements: 4.3 x 2.7 x 2.5 cm. Probable collapsing physiologic cyst measuring 2.3 x 1.7 x 1.5 cm.  Left ovary  Measurements: 4.1 x 3.0 x 2.3 cm. Again noted is a complex lesion with an echogenic component and internal cystic component with dot-dash-type echo configuration, measuring overall 3.2 x 3.1 x 2.5 cm.  Pulsed Doppler evaluation of both ovaries demonstrates normal low-resistance arterial and venous waveforms, although there is suboptimal visualization of venous waveforms in the right ovary, likely technical.  Other findings   Trace free fluid in the cul-de-sac  IMPRESSION: Probable collapsing physiologic right ovarian cyst. If the patient has right-sided symptoms, then consider followup pelvic ultrasound to document resolution in 4-6 weeks following the patient's next menses.  Complex left ovarian mass, stable, with imaging features highly suggestive of a dermoid. In general, surgical resection is considered due to potential for malignant degeneration.  No acute abnormality or sonographic evidence for ovarian torsion.   Electronically Signed   By: Conchita Paris M.D.   On: 12/10/2013 12:17     EKG Interpretation None      MDM   Final diagnoses:  Adnexal mass  LLQ pain    Filed Vitals:   12/10/13 1203 12/10/13 1205 12/10/13 1221 12/10/13 1245  BP: 102/76  108/68 106/75  Pulse:  75  76  Temp:      TempSrc:      Resp:  15 16 13   Height:  Weight:      SpO2:  98% 100% 100%    Onisha Cedeno is a 33 y.o. female presenting with onset of left lower quadrant pain last about 15 minutes and spontaneously resolved. Concern for intermittent ovarian torsion. We'll perform pelvic and pelvic ultrasound.   Serial abdominal exams remain benign. Patient is not pregnant. Ultrasound reveals a complex left adnexal mass. Suggestive of dermoid. Read recommend surgical resection as masses may have malignant degeneration. Patient is aware of this mass. She has requested surgical excision in the past. States that because she is anemic at her baseline and a Jehovah's Witness therefore will not consent to potential blood transfusion and surgery has been deferred.  Evaluation does not show pathology that would require ongoing emergent intervention or inpatient treatment. Pt is hemodynamically stable and mentating appropriately. Discussed findings and plan with patient/guardian, who agrees with care plan. All questions answered. Return precautions discussed and outpatient follow up given.      Monico Blitz,  PA-C 12/10/13 1510

## 2013-12-11 NOTE — ED Provider Notes (Signed)
Medical screening examination/treatment/procedure(s) were performed by non-physician practitioner and as supervising physician I was immediately available for consultation/collaboration.  Neta Ehlers, MD 12/11/13 [phone]

## 2013-12-12 ENCOUNTER — Telehealth: Payer: Self-pay

## 2013-12-12 LAB — GC/CHLAMYDIA PROBE AMP
CT Probe RNA: NEGATIVE
GC PROBE AMP APTIMA: NEGATIVE

## 2013-12-12 NOTE — Telephone Encounter (Signed)
Contacted patient to inform her that her PCP would have to refer her to a OB GYN. Patient stated rudely "that's fine I took care of it myself"

## 2013-12-12 NOTE — Telephone Encounter (Signed)
Patient is requesting Dr. Naaman Plummer assistance referring her to a OB GYN so she can get in "faster". Patient states she was evaluated at the ER this weekend and DX with a mass on her ovary that requires surgery. Patient states she is not able to get in at the [facility] GYN for 4 months.

## 2013-12-22 ENCOUNTER — Telehealth: Payer: Self-pay | Admitting: *Deleted

## 2013-12-22 NOTE — Telephone Encounter (Signed)
Jessica Horn called saying that she has an appt with Dr Naaman Plummer coming up which she has been looking forward to since he has been so hard to get appt scheduled for her, but unfortunately she has been to the ED which was $150 copay and seen 4 specialist @35 /each and cannot afford the copay for this visit until her husband gets paid 8/21.  She is very sorry and would like a call back advising her.  Unfortunately for her there is nothing else available with Dr Naaman Plummer until 01/10/14 @ 12:20.  She will be able to take that appt.

## 2013-12-28 ENCOUNTER — Encounter: Payer: BC Managed Care – PPO | Admitting: Physical Medicine & Rehabilitation

## 2014-01-10 ENCOUNTER — Encounter
Payer: BC Managed Care – PPO | Attending: Physical Medicine & Rehabilitation | Admitting: Physical Medicine & Rehabilitation

## 2014-01-10 ENCOUNTER — Encounter: Payer: Self-pay | Admitting: Physical Medicine & Rehabilitation

## 2014-01-10 VITALS — BP 116/79 | HR 84 | Resp 16 | Ht 63.0 in | Wt 140.0 lb

## 2014-01-10 DIAGNOSIS — M542 Cervicalgia: Secondary | ICD-10-CM | POA: Diagnosis not present

## 2014-01-10 DIAGNOSIS — F411 Generalized anxiety disorder: Secondary | ICD-10-CM | POA: Insufficient documentation

## 2014-01-10 DIAGNOSIS — G119 Hereditary ataxia, unspecified: Secondary | ICD-10-CM | POA: Insufficient documentation

## 2014-01-10 DIAGNOSIS — M546 Pain in thoracic spine: Secondary | ICD-10-CM | POA: Insufficient documentation

## 2014-01-10 DIAGNOSIS — F3289 Other specified depressive episodes: Secondary | ICD-10-CM | POA: Insufficient documentation

## 2014-01-10 DIAGNOSIS — F419 Anxiety disorder, unspecified: Secondary | ICD-10-CM | POA: Insufficient documentation

## 2014-01-10 DIAGNOSIS — D6800 Von Willebrand disease, unspecified: Secondary | ICD-10-CM | POA: Diagnosis not present

## 2014-01-10 DIAGNOSIS — F32A Depression, unspecified: Secondary | ICD-10-CM

## 2014-01-10 DIAGNOSIS — Z79899 Other long term (current) drug therapy: Secondary | ICD-10-CM

## 2014-01-10 DIAGNOSIS — G4700 Insomnia, unspecified: Secondary | ICD-10-CM | POA: Insufficient documentation

## 2014-01-10 DIAGNOSIS — F329 Major depressive disorder, single episode, unspecified: Secondary | ICD-10-CM | POA: Diagnosis not present

## 2014-01-10 DIAGNOSIS — F341 Dysthymic disorder: Secondary | ICD-10-CM

## 2014-01-10 DIAGNOSIS — R269 Unspecified abnormalities of gait and mobility: Secondary | ICD-10-CM | POA: Insufficient documentation

## 2014-01-10 DIAGNOSIS — Z853 Personal history of malignant neoplasm of breast: Secondary | ICD-10-CM | POA: Insufficient documentation

## 2014-01-10 DIAGNOSIS — G8929 Other chronic pain: Secondary | ICD-10-CM | POA: Diagnosis present

## 2014-01-10 DIAGNOSIS — IMO0001 Reserved for inherently not codable concepts without codable children: Secondary | ICD-10-CM

## 2014-01-10 DIAGNOSIS — M7918 Myalgia, other site: Secondary | ICD-10-CM

## 2014-01-10 DIAGNOSIS — Z5181 Encounter for therapeutic drug level monitoring: Secondary | ICD-10-CM

## 2014-01-10 MED ORDER — CITALOPRAM HYDROBROMIDE 10 MG PO TABS: 10.0000 mg | ORAL_TABLET | Freq: Every day | ORAL | Status: DC

## 2014-01-10 MED ORDER — ALPRAZOLAM 0.5 MG PO TABS: 0.5000 mg | ORAL_TABLET | Freq: Two times a day (BID) | ORAL | Status: DC | PRN

## 2014-01-10 MED ORDER — HYDROCODONE-ACETAMINOPHEN 5-325 MG PO TABS: 1.0000 | ORAL_TABLET | Freq: Four times a day (QID) | ORAL | Status: DC | PRN

## 2014-01-10 NOTE — Progress Notes (Signed)
Subjective:    Patient ID: Jessica Horn, female    DOB: 10-29-1980, 33 y.o.   MRN: [id number]  HPI  Jessica Horn is back regarding her chronic pain. She was dx'ed with ovarian mass and cysts.  She developed a pain her abdomen which prompted the trip to the ED last month where these were discovered. She is seeing a hematologist for evaluation and potential treatment of her VWD prior to any surgery.  She does not accept blood products so this is quite a dilemma.  Her sleep has not been good. She feels that it might be affected by anxiety over her deaths in family (sister and aunt)---she is worried that she will be next.  Her anxious is high. She denies depression although she doesn't rule it out. It is quite difficult to get to sleep.   Her genetic testing came back negative apparently earlier this year.   Pain Inventory Average Pain 10 Pain Right Now 10 My pain is sharp and stabbing  In the last 24 hours, has pain interfered with the following? General activity 9 Relation with others 9 Enjoyment of life 9 What TIME of day is your pain at its worst? constant all day Sleep (in general) Poor  Pain is worse with: walking, bending, sitting, inactivity, standing and some activites Pain improves with: heat/ice and medication Relief from Meds: na  Mobility walk without assistance walk with assistance use a cane how many minutes can you walk? 15 ability to climb steps?  yes do you drive?  no transfers alone Do you have any goals in this area?  yes  Function disabled: date disabled na I need assistance with the following:  meal prep and household duties Do you have any goals in this area?  yes  Neuro/Psych weakness dizziness anxiety  Prior Studies Any changes since last visit?  no  Physicians involved in your care Any changes since last visit?  yes Dr. Sherrie George, Dr. Harlow Asa Hematologist    Family History  Problem Relation Age of Onset  . Hypertension Mother   .  Anemia Mother   . Cancer Maternal Aunt     breast  . Kidney disease Maternal Grandmother   . Diabetes Maternal Grandmother   . Breast cancer Maternal Grandmother 78  . Diabetes Paternal Grandfather   . Lupus Other   . Multiple sclerosis Other   . Breast cancer Sister 23  . Breast cancer Paternal Aunt 2  . Diabetes Maternal Grandfather   . Cancer Paternal Grandmother     NOS  . Breast cancer Paternal Aunt 14  . Breast cancer Paternal Aunt   . Ovarian cancer Paternal Aunt    History   Social History  . Marital Status: Married    Spouse Name: N/A    Number of Children: 1  . Years of Education: N/A   Social History Main Topics  . Smoking status: Never Smoker   . Smokeless tobacco: None  . Alcohol Use: No  . Drug Use: No  . Sexual Activity: Yes   Other Topics Concern  . None   Social History Narrative  . None   Past Surgical History  Procedure Laterality Date  . Tubal ligation    . Cesarean section    . Ovarian cyst removal     Past Medical History  Diagnosis Date  . Fibromyalgia   . Back pain   . Ovarian cyst   . Cancer     breast CA at 33 y/o  .  Cancer of spine     spine CA for past 10 years  . Breast cancer   . Depression    BP 116/79  Pulse 84  Resp 16  Ht 5\' 3"  (1.6 m)  Wt 140 lb (63.504 kg)  BMI 24.81 kg/m2  SpO2 98%  Opioid Risk Score:   Fall Risk Score:      Review of Systems  Constitutional: Positive for appetite change.  Gastrointestinal: Positive for abdominal pain.  Neurological: Positive for dizziness and weakness.  Psychiatric/Behavioral: The patient is nervous/anxious.   All other systems reviewed and are negative.      Objective:   Physical Exam General: Alert and oriented x 3, Appears generally uncomfortable.  HEENT: Head is normocephalic, atraumatic, PERRLA, EOMI, sclera anicteric, oral mucosa pink and moist, dentition intact, ext ear canals clear,  Neck: Supple without JVD or lymphadenopathy  Heart: Reg rate and  rhythm. No murmurs rubs or gallops  Chest: CTA bilaterally without wheezes, rales, or rhonchi; no distress  Abdomen: Soft, non-tender, non-distended, bowel sounds positive.  Extremities: No clubbing, cyanosis, or edema. Pulses are 2+  Skin: Clean and intact without signs of breakdown  Neuro: Pt is cognitively appropriate with normal insight, memory, and awareness. Occasionally had mild attention issues. Cranial nerves 2-12 are intact. I saw no nystagmus. Sensory exam is normal for PP and LT although proprioception was decreased in both legs, feet seemed to be somewhat intact. She had diminished [place] of both arms and legs and had difficulty with heel to shin, finger to nose and rapid alternating movements. . She has mild intentional tremor in both hands.. She is using a cane still for balance.  Musculoskeletal: posture was fair.  . Tender points/areas were less prominent. Having axial pain.  Psych: is pleasant but does seem to be on edge.  Assessment & Plan:   1. Chronic cervico-thoracic axial spine pain. Origin could be multifactorial given her history, although recent xrays were unremarkable. This appears generally myofascial in nature with her posture/scoliosis also playing a role  2. 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  3. Depression with anxiety (PTSD?)  4. Insomnia--persistent  5. Hx of breast cancer rx'ed in 2004? With CTX and XRT  6. Hx of Von Willebrand's disease     Plan:  1. Continue robaxin for muscle spasm 2. Made a referral to Dr. Antionette Poles to see if he can help Jessica Horn with better coping skills and with the feelings that she is the next person in her family who have problems or pass from medical complications.   3. In the meantime, begin xanax for breakthrough anxiety and to help get her to sleep. Will also start low dose celexa 10mg  qhs for sleep, anxiety, and depression   4. H/O follow up with OBGYN in respect to her VWD. 5. Refilled  hydrocodone today with a second rx for next month. 7. Continue HEP--to build up posture, core. I believe this is very important for her.  8. Follow up with me in two months. 30 min of face to face patient care time was spent during this visit. All questions were encouraged and answered.

## 2014-01-10 NOTE — Addendum Note (Signed)
Addended by: Jules Schick on: 01/10/2014 12:58 PM   Modules accepted: Orders

## 2014-01-10 NOTE — Patient Instructions (Signed)
PLEASE CALL ME WITH ANY PROBLEMS OR QUESTIONS (#[phone]).      

## 2014-01-23 ENCOUNTER — Telehealth: Payer: Self-pay | Admitting: Hematology and Oncology

## 2014-01-27 ENCOUNTER — Ambulatory Visit: Payer: BC Managed Care – PPO | Admitting: Oncology

## 2014-02-06 ENCOUNTER — Other Ambulatory Visit: Payer: Self-pay | Admitting: Physical Medicine & Rehabilitation

## 2014-02-09 DIAGNOSIS — F411 Generalized anxiety disorder: Secondary | ICD-10-CM

## 2014-02-10 ENCOUNTER — Telehealth: Payer: Self-pay | Admitting: Hematology and Oncology

## 2014-02-10 NOTE — Telephone Encounter (Signed)
, °

## 2014-02-13 ENCOUNTER — Ambulatory Visit: Payer: BC Managed Care – PPO | Admitting: Hematology and Oncology

## 2014-02-14 DIAGNOSIS — F321 Major depressive disorder, single episode, moderate: Secondary | ICD-10-CM

## 2014-02-14 DIAGNOSIS — F4310 Post-traumatic stress disorder, unspecified: Secondary | ICD-10-CM

## 2014-02-14 DIAGNOSIS — F410 Panic disorder [episodic paroxysmal anxiety] without agoraphobia: Secondary | ICD-10-CM

## 2014-02-15 ENCOUNTER — Encounter: Payer: Self-pay | Admitting: Physical Medicine & Rehabilitation

## 2014-02-15 ENCOUNTER — Encounter
Payer: BC Managed Care – PPO | Attending: Physical Medicine & Rehabilitation | Admitting: Physical Medicine & Rehabilitation

## 2014-02-15 VITALS — HR 73 | Resp 14 | Ht 63.0 in | Wt 143.0 lb

## 2014-02-15 DIAGNOSIS — F418 Other specified anxiety disorders: Secondary | ICD-10-CM | POA: Insufficient documentation

## 2014-02-15 DIAGNOSIS — R251 Tremor, unspecified: Secondary | ICD-10-CM

## 2014-02-15 DIAGNOSIS — R269 Unspecified abnormalities of gait and mobility: Secondary | ICD-10-CM

## 2014-02-15 DIAGNOSIS — F419 Anxiety disorder, unspecified: Secondary | ICD-10-CM

## 2014-02-15 DIAGNOSIS — F32A Depression, unspecified: Secondary | ICD-10-CM

## 2014-02-15 DIAGNOSIS — M546 Pain in thoracic spine: Secondary | ICD-10-CM

## 2014-02-15 DIAGNOSIS — G4700 Insomnia, unspecified: Secondary | ICD-10-CM

## 2014-02-15 DIAGNOSIS — M791 Myalgia: Secondary | ICD-10-CM | POA: Diagnosis present

## 2014-02-15 DIAGNOSIS — F329 Major depressive disorder, single episode, unspecified: Secondary | ICD-10-CM

## 2014-02-15 DIAGNOSIS — M7918 Myalgia, other site: Secondary | ICD-10-CM

## 2014-02-15 DIAGNOSIS — M542 Cervicalgia: Secondary | ICD-10-CM

## 2014-02-15 MED ORDER — ALPRAZOLAM 1 MG PO TABS: 0.5000 mg | ORAL_TABLET | Freq: Two times a day (BID) | ORAL | Status: DC | PRN

## 2014-02-15 MED ORDER — CITALOPRAM HYDROBROMIDE 20 MG PO TABS: 20.0000 mg | ORAL_TABLET | Freq: Every day | ORAL | Status: DC

## 2014-02-15 MED ORDER — HYDROCODONE-ACETAMINOPHEN 5-325 MG PO TABS: 1.0000 | ORAL_TABLET | Freq: Four times a day (QID) | ORAL | Status: DC | PRN

## 2014-02-15 NOTE — Progress Notes (Signed)
Subjective:    Patient ID: Jessica Horn, female    DOB: Feb 23, 1981, 33 y.o.   MRN: [id number]  HPI  Jessica Horn is back regarding her chronic pain. She is seeing Dr. Valentina Shaggy and feels that's been really helpful for coping skills. I had discussed Jessica Horn with him as well.  She is using some of the coping strategies already.   She is short with her pill counts today as she dropped pills and purse contents and several pills were destroyed.   She is seeing a oncologist/obgyn regarding potential upcoming surgery.   Jessica Horn tries to walk a little each day. It helps her get out of the house. She is not stretching with this exercise.        Pain Inventory Average Pain 8 Pain Right Now 3 My pain is constant, sharp and stabbing  In the last 24 hours, has pain interfered with the following? General activity 7 Relation with others 7 Enjoyment of life 7 What TIME of day is your pain at its worst? All Sleep (in general) Poor  Pain is worse with: walking, bending and standing Pain improves with: heat/ice and medication Relief from Meds: 8  Mobility use a cane  Function not employed: date last employed 2013  Neuro/Psych weakness depression anxiety  Prior Studies Any changes since last visit?  no  Physicians involved in your care Any changes since last visit?  yes Psychiatrist Zelson   Family History  Problem Relation Age of Onset  . Hypertension Mother   . Anemia Mother   . Cancer Maternal Aunt     breast  . Kidney disease Maternal Grandmother   . Diabetes Maternal Grandmother   . Breast cancer Maternal Grandmother 78  . Diabetes Paternal Grandfather   . Lupus Other   . Multiple sclerosis Other   . Breast cancer Sister 51  . Breast cancer Paternal Aunt 24  . Diabetes Maternal Grandfather   . Cancer Paternal Grandmother     NOS  . Breast cancer Paternal Aunt 62  . Breast cancer Paternal Aunt   . Ovarian cancer Paternal Aunt    History   Social History    . Marital Status: Married    Spouse Name: N/A    Number of Children: 1  . Years of Education: N/A   Social History Main Topics  . Smoking status: Never Smoker   . Smokeless tobacco: None  . Alcohol Use: No  . Drug Use: No  . Sexual Activity: Yes   Other Topics Concern  . None   Social History Narrative  . None   Past Surgical History  Procedure Laterality Date  . Tubal ligation    . Cesarean section    . Ovarian cyst removal     Past Medical History  Diagnosis Date  . Fibromyalgia   . Back pain   . Ovarian cyst   . Cancer     breast CA at 33 y/o  . Cancer of spine     spine CA for past 10 years  . Breast cancer   . Depression    Pulse 73  Resp 14  Ht 5\' 3"  (1.6 m)  Wt 143 lb (64.864 kg)  BMI 25.34 kg/m2  SpO2 100%  Opioid Risk Score:   Fall Risk Score: Moderate Fall Risk (6-13 points)   Review of Systems     Objective:   Physical Exam  General: Alert and oriented x 3, Appears generally uncomfortable.  HEENT: Head is normocephalic, atraumatic, PERRLA,  EOMI, sclera anicteric, oral mucosa pink and moist, dentition intact, ext ear canals clear,  Neck: Supple without JVD or lymphadenopathy  Heart: Reg rate and rhythm. No murmurs rubs or gallops  Chest: CTA bilaterally without wheezes, rales, or rhonchi; no distress  Abdomen: Soft, non-tender, non-distended, bowel sounds positive.  Extremities: No clubbing, cyanosis, or edema. Pulses are 2+  Skin: Clean and intact without signs of breakdown  Neuro: Pt is cognitively appropriate with normal insight, memory, and awareness. Occasionally had mild attention issues. Cranial nerves 2-12 are intact. I saw no nystagmus. Sensory exam is normal for PP and LT although proprioception was decreased in both legs, feet seemed to be somewhat intact. She had diminished [place] of both arms and legs and had difficulty with heel to shin, finger to nose and rapid alternating movements. . She has mild intentional tremor in both hands..  She is using a cane still for balance.  Musculoskeletal: posture was fair. . Tender points/areas were less prominent. Having axial pain.  Psych: is pleasant but does seem to be on edge.    Assessment & Plan:   1. Chronic cervico-thoracic axial spine pain. Origin could be multifactorial given her history, although recent xrays were unremarkable. This appears generally myofascial in nature with her posture/scoliosis also playing a role  2. 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  3. Depression with anxiety (PTSD?)  4. Insomnia--persistent  5. Hx of breast cancer rx'ed in 2004? With CTX and XRT  6. Hx of Von Willebrand's disease    Plan:  1. Continue robaxin for muscle spasm  2. Continue with counseling with Dr. Valentina Shaggy for coping and anxiety mgt.  3. Increase xanax to 0.5mg  to 1mg  q12 prn. Also will increase celexa to 20mg  qhs. 4. H/O follow up with OBGYN in respect to her VWD.  5. Refilled hydrocodone today with a second rx for next month.  7. Continue HEP per routine.  8. Follow up with me in two months. 30 min of face to face patient care time was spent during this visit. All questions were encouraged and answered.

## 2014-02-15 NOTE — Patient Instructions (Signed)
PLEASE CALL ME WITH ANY PROBLEMS OR QUESTIONS (#[phone]).      

## 2014-02-15 NOTE — Addendum Note (Signed)
Addended by: Caro Hight on: 02/15/2014 01:46 PM   Modules accepted: Orders

## 2014-02-17 ENCOUNTER — Encounter (HOSPITAL_COMMUNITY): Payer: Self-pay | Admitting: Emergency Medicine

## 2014-02-17 DIAGNOSIS — Z85830 Personal history of malignant neoplasm of bone: Secondary | ICD-10-CM | POA: Diagnosis not present

## 2014-02-17 DIAGNOSIS — Z8742 Personal history of other diseases of the female genital tract: Secondary | ICD-10-CM | POA: Diagnosis not present

## 2014-02-17 DIAGNOSIS — F329 Major depressive disorder, single episode, unspecified: Secondary | ICD-10-CM | POA: Diagnosis not present

## 2014-02-17 DIAGNOSIS — Z3202 Encounter for pregnancy test, result negative: Secondary | ICD-10-CM | POA: Insufficient documentation

## 2014-02-17 DIAGNOSIS — Z79899 Other long term (current) drug therapy: Secondary | ICD-10-CM | POA: Insufficient documentation

## 2014-02-17 DIAGNOSIS — N920 Excessive and frequent menstruation with regular cycle: Secondary | ICD-10-CM | POA: Diagnosis not present

## 2014-02-17 DIAGNOSIS — Z853 Personal history of malignant neoplasm of breast: Secondary | ICD-10-CM | POA: Diagnosis not present

## 2014-02-17 DIAGNOSIS — R109 Unspecified abdominal pain: Secondary | ICD-10-CM | POA: Diagnosis present

## 2014-02-17 LAB — URINALYSIS, ROUTINE W REFLEX MICROSCOPIC
BILIRUBIN URINE: NEGATIVE
GLUCOSE, UA: NEGATIVE mg/dL
Hgb urine dipstick: NEGATIVE
Ketones, ur: NEGATIVE mg/dL
LEUKOCYTES UA: NEGATIVE
Nitrite: NEGATIVE
PH: 6 (ref 5.0–8.0)
PROTEIN: NEGATIVE mg/dL
Specific Gravity, Urine: 1.008 (ref 1.005–1.030)
Urobilinogen, UA: 0.2 mg/dL (ref 0.0–1.0)

## 2014-02-17 LAB — PREGNANCY, URINE: PREG TEST UR: NEGATIVE

## 2014-02-17 NOTE — ED Notes (Signed)
Pt. reports low abdominal pain onset yesterday with vaginal bleeding and emesis ( LMP onset yesterday ) , denies diarrhea , no fever or chills.

## 2014-02-18 ENCOUNTER — Emergency Department (HOSPITAL_COMMUNITY)
Admission: EM | Admit: 2014-02-18 | Discharge: 2014-02-18 | Disposition: A | Discharge status: Home / Self Care | Payer: BC Managed Care – PPO | Attending: Emergency Medicine | Admitting: Emergency Medicine

## 2014-02-18 DIAGNOSIS — N920 Excessive and frequent menstruation with regular cycle: Secondary | ICD-10-CM

## 2014-02-18 LAB — CBC WITH DIFFERENTIAL/PLATELET
Basophils Absolute: 0 10*3/uL (ref 0.0–0.1)
Basophils Relative: 0 % (ref 0–1)
EOS PCT: 3 % (ref 0–5)
Eosinophils Absolute: 0.2 10*3/uL (ref 0.0–0.7)
HEMATOCRIT: 34.3 % — AB (ref 36.0–46.0)
Hemoglobin: 11.4 g/dL — ABNORMAL LOW (ref 12.0–15.0)
LYMPHS PCT: 47 % — AB (ref 12–46)
Lymphs Abs: 2.7 10*3/uL (ref 0.7–4.0)
MCH: 27.8 pg (ref 26.0–34.0)
MCHC: 33.2 g/dL (ref 30.0–36.0)
MCV: 83.7 fL (ref 78.0–100.0)
MONO ABS: 0.4 10*3/uL (ref 0.1–1.0)
MONOS PCT: 7 % (ref 3–12)
NEUTROS ABS: 2.4 10*3/uL (ref 1.7–7.7)
Neutrophils Relative %: 43 % (ref 43–77)
Platelets: 261 10*3/uL (ref 150–400)
RBC: 4.1 MIL/uL (ref 3.87–5.11)
RDW: 12.9 % (ref 11.5–15.5)
WBC: 5.7 10*3/uL (ref 4.0–10.5)

## 2014-02-18 LAB — COMPREHENSIVE METABOLIC PANEL
ALT: 5 U/L (ref 0–35)
AST: 16 U/L (ref 0–37)
Albumin: 4.3 g/dL (ref 3.5–5.2)
Alkaline Phosphatase: 62 U/L (ref 39–117)
Anion gap: 13 (ref 5–15)
BILIRUBIN TOTAL: 0.3 mg/dL (ref 0.3–1.2)
BUN: 9 mg/dL (ref 6–23)
CHLORIDE: 103 meq/L (ref 96–112)
CO2: 24 meq/L (ref 19–32)
Calcium: 9.1 mg/dL (ref 8.4–10.5)
Creatinine, Ser: 0.55 mg/dL (ref 0.50–1.10)
Glucose, Bld: 92 mg/dL (ref 70–99)
Potassium: 3.9 mEq/L (ref 3.7–5.3)
Sodium: 140 mEq/L (ref 137–147)
Total Protein: 8.1 g/dL (ref 6.0–8.3)

## 2014-02-18 MED ORDER — HYDROMORPHONE HCL 1 MG/ML IJ SOLN
1.0000 mg | Freq: Once | INTRAMUSCULAR | Status: AC
  Administered 2014-02-18: 1 mg via INTRAVENOUS
  Filled 2014-02-18: qty 1

## 2014-02-18 MED ORDER — ONDANSETRON HCL 4 MG/2ML IJ SOLN
4.0000 mg | Freq: Once | INTRAMUSCULAR | Status: AC
  Administered 2014-02-18: 4 mg via INTRAVENOUS
  Filled 2014-02-18: qty 2

## 2014-02-18 MED ORDER — SODIUM CHLORIDE 0.9 % IV BOLUS (SEPSIS)
1000.0000 mL | Freq: Once | INTRAVENOUS | Status: AC
  Administered 2014-02-18: 1000 mL via INTRAVENOUS

## 2014-02-18 MED ORDER — SODIUM CHLORIDE 0.9 % IV SOLN: INTRAVENOUS | Status: DC

## 2014-02-18 NOTE — Discharge Instructions (Signed)
Followup with your gynecologist next week Abnormal Uterine Bleeding Abnormal uterine bleeding can affect women at various stages in life, including teenagers, women in their reproductive years, pregnant women, and women who have reached menopause. Several kinds of uterine bleeding are considered abnormal, including:  Bleeding or spotting between periods.   Bleeding after sexual intercourse.   Bleeding that is heavier or more than normal.   Periods that last longer than usual.  Bleeding after menopause.  Many cases of abnormal uterine bleeding are minor and simple to treat, while others are more serious. Any type of abnormal bleeding should be evaluated by your health care provider. Treatment will depend on the cause of the bleeding. HOME CARE INSTRUCTIONS Monitor your condition for any changes. The following actions may help to alleviate any discomfort you are experiencing:  Avoid the use of tampons and douches as directed by your health care provider.  Change your pads frequently. You should get regular pelvic exams and Pap tests. Keep all follow-up appointments for diagnostic tests as directed by your health care provider.  SEEK MEDICAL CARE IF:   Your bleeding lasts more than 1 week.   You feel dizzy at times.  SEEK IMMEDIATE MEDICAL CARE IF:   You pass out.   You are changing pads every 15 to 30 minutes.   You have abdominal pain.  You have a fever.   You become sweaty or weak.   You are passing large blood clots from the vagina.   You start to feel nauseous and vomit. MAKE SURE YOU:   Understand these instructions.  Will watch your condition.  Will get help right away if you are not doing well or get worse. Document Released: 04/28/2005 Document Revised: 05/03/2013 Document Reviewed: 11/25/2012 [facility] Patient Information 2015 [place], [place]. This information is not intended to replace advice given to you by your health care provider. Make sure you  discuss any questions you have with your health care provider.

## 2014-02-18 NOTE — ED Notes (Signed)
Pt reports she started her period yesterday and she has a hx of cyst, she thinks a cyst might have ruptured due to the large amt of bleeding. Pt states she saturated 3 tampons an hour. Pt also reports lower abd pain and dizziness. Pt states she passed out a few times.

## 2014-02-18 NOTE — ED Provider Notes (Addendum)
CSN: [id number]     Arrival date & time 02/17/14  2154 History   First MD Initiated Contact with Patient 02/18/14 [phone]     Chief Complaint  Patient presents with  . Abdominal Pain     (Consider location/radiation/quality/duration/timing/severity/associated sxs/prior Treatment) HPI Comments: Patient here complaining of abdominal cramping with associated vaginal bleeding. Patient is normal. Began yesterday and she noted copious amounts of dark vaginal blood. She is also noted some syncope when standing. Denies any vomiting. No chest pain. No dyspnea. No severe headaches with this. Does have a history of ovarian cysts and is concerned that this may have ruptured. She also has chronic pain but has used her pain meds. Nothing makes her symptoms better. No treatment used prior to arrival  Patient is a 33 y.o. female presenting with abdominal pain. The history is provided by the patient and the spouse.  Abdominal Pain   Past Medical History  Diagnosis Date  . Fibromyalgia   . Back pain   . Ovarian cyst   . Cancer     breast CA at 33 y/o  . Cancer of spine     spine CA for past 10 years  . Breast cancer   . Depression    Past Surgical History  Procedure Laterality Date  . Tubal ligation    . Cesarean section    . Ovarian cyst removal     Family History  Problem Relation Age of Onset  . Hypertension Mother   . Anemia Mother   . Cancer Maternal Aunt     breast  . Kidney disease Maternal Grandmother   . Diabetes Maternal Grandmother   . Breast cancer Maternal Grandmother 78  . Diabetes Paternal Grandfather   . Lupus Other   . Multiple sclerosis Other   . Breast cancer Sister 55  . Breast cancer Paternal Aunt 7  . Diabetes Maternal Grandfather   . Cancer Paternal Grandmother     NOS  . Breast cancer Paternal Aunt 35  . Breast cancer Paternal Aunt   . Ovarian cancer Paternal Aunt    History  Substance Use Topics  . Smoking status: Never Smoker   . Smokeless tobacco: Not  on file  . Alcohol Use: No   OB History   Grav Para Term Preterm Abortions TAB SAB Ect Mult Living   3 1  1 2  2   1      Review of Systems  Gastrointestinal: Positive for abdominal pain.  All other systems reviewed and are negative.     Allergies  Review of patient's allergies indicates no known allergies.  Home Medications   Prior to Admission medications   Medication Sig Start Date End Date Taking? Authorizing Provider  ALPRAZolam Duanne Moron) 1 MG tablet Take 0.5-1 tablets (0.5-1 mg total) by mouth 2 (two) times daily as needed for anxiety. 02/15/14  Yes Meredith Staggers, MD  citalopram (CELEXA) 20 MG tablet Take 1 tablet (20 mg total) by mouth at bedtime. 02/15/14  Yes Meredith Staggers, MD  HYDROcodone-acetaminophen (NORCO/VICODIN) 5-325 MG per tablet Take 1 tablet by mouth every 6 (six) hours as needed. 02/15/14  Yes Meredith Staggers, MD  methocarbamol (ROBAXIN) 500 MG tablet Take 500 mg by mouth 4 (four) times daily.   Yes Historical Provider, MD  QUEtiapine (SEROQUEL) 100 MG tablet Take 100 mg by mouth at bedtime.   Yes Historical Provider, MD   BP 118/59  Pulse 70  Temp(Src) 98.6 F (37 C)  Resp 18  Ht 5\' 3"  (1.6 m)  Wt 142 lb (64.411 kg)  BMI 25.16 kg/m2  SpO2 100%  LMP 02/16/2014 Physical Exam  Nursing note and vitals reviewed. Constitutional: She is oriented to person, place, and time. She appears well-developed and well-nourished.  Non-toxic appearance. No distress.  HENT:  Head: Normocephalic and atraumatic.  Eyes: Conjunctivae, EOM and lids are normal. Pupils are equal, round, and reactive to light.  Neck: Normal range of motion. Neck supple. No tracheal deviation present. No mass present.  Cardiovascular: Normal rate, regular rhythm and normal heart sounds.  Exam reveals no gallop.   No murmur heard. Pulmonary/Chest: Effort normal and breath sounds normal. No stridor. No respiratory distress. She has no decreased breath sounds. She has no wheezes. She has no  rhonchi. She has no rales.  Abdominal: Soft. Normal appearance and bowel sounds are normal. She exhibits no distension. There is no tenderness. There is no rigidity, no rebound, no guarding and no CVA tenderness.  Musculoskeletal: Normal range of motion. She exhibits no edema and no tenderness.  Neurological: She is alert and oriented to person, place, and time. She has normal strength. No cranial nerve deficit or sensory deficit. GCS eye subscore is 4. GCS verbal subscore is 5. GCS motor subscore is 6.  Skin: Skin is warm and dry. No abrasion and no rash noted.  Psychiatric: She has a normal mood and affect. Her speech is normal and behavior is normal.    ED Course  Procedures (including critical care time) Labs Review Labs Reviewed  CBC WITH DIFFERENTIAL - Abnormal; Notable for the following:    Hemoglobin 11.4 (*)    HCT 34.3 (*)    Lymphocytes Relative 47 (*)    All other components within normal limits  COMPREHENSIVE METABOLIC PANEL  PREGNANCY, URINE  URINALYSIS, ROUTINE W REFLEX MICROSCOPIC    Imaging Review No results found.   EKG Interpretation None      MDM   Final diagnoses:  None    Patient given IV fluids here and feels better. She has deferred her pelvic exam. She was also given IV hydromorphone for pain. She continues to deny having any abdominal pain on exam. Stable for discharge and will followup with her gynecologist    Leota Jacobsen, MD 02/18/14 0440  Leota Jacobsen, MD 02/18/14 [phone]

## 2014-02-22 DIAGNOSIS — F4310 Post-traumatic stress disorder, unspecified: Secondary | ICD-10-CM

## 2014-02-22 DIAGNOSIS — F331 Major depressive disorder, recurrent, moderate: Secondary | ICD-10-CM

## 2014-02-22 DIAGNOSIS — F410 Panic disorder [episodic paroxysmal anxiety] without agoraphobia: Secondary | ICD-10-CM

## 2014-02-24 DIAGNOSIS — F410 Panic disorder [episodic paroxysmal anxiety] without agoraphobia: Secondary | ICD-10-CM

## 2014-02-24 DIAGNOSIS — F331 Major depressive disorder, recurrent, moderate: Secondary | ICD-10-CM

## 2014-02-24 DIAGNOSIS — F4310 Post-traumatic stress disorder, unspecified: Secondary | ICD-10-CM

## 2014-03-03 DIAGNOSIS — F410 Panic disorder [episodic paroxysmal anxiety] without agoraphobia: Secondary | ICD-10-CM

## 2014-03-03 DIAGNOSIS — F4310 Post-traumatic stress disorder, unspecified: Secondary | ICD-10-CM

## 2014-03-03 DIAGNOSIS — F331 Major depressive disorder, recurrent, moderate: Secondary | ICD-10-CM

## 2014-03-08 ENCOUNTER — Ambulatory Visit: Payer: BC Managed Care – PPO | Admitting: Physical Medicine & Rehabilitation

## 2014-03-08 ENCOUNTER — Other Ambulatory Visit: Payer: Self-pay | Admitting: Physical Medicine & Rehabilitation

## 2014-03-09 ENCOUNTER — Other Ambulatory Visit: Payer: Self-pay | Admitting: Physical Medicine & Rehabilitation

## 2014-03-13 ENCOUNTER — Encounter (HOSPITAL_COMMUNITY): Payer: Self-pay | Admitting: Emergency Medicine

## 2014-03-15 ENCOUNTER — Ambulatory Visit: Payer: BC Managed Care – PPO | Admitting: Registered Nurse

## 2014-03-20 DIAGNOSIS — F4310 Post-traumatic stress disorder, unspecified: Secondary | ICD-10-CM

## 2014-03-20 DIAGNOSIS — F331 Major depressive disorder, recurrent, moderate: Secondary | ICD-10-CM

## 2014-03-20 DIAGNOSIS — F410 Panic disorder [episodic paroxysmal anxiety] without agoraphobia: Secondary | ICD-10-CM

## 2014-03-24 DIAGNOSIS — F410 Panic disorder [episodic paroxysmal anxiety] without agoraphobia: Secondary | ICD-10-CM

## 2014-03-24 DIAGNOSIS — F4310 Post-traumatic stress disorder, unspecified: Secondary | ICD-10-CM

## 2014-03-24 DIAGNOSIS — F331 Major depressive disorder, recurrent, moderate: Secondary | ICD-10-CM

## 2014-03-27 ENCOUNTER — Telehealth: Payer: Self-pay | Admitting: *Deleted

## 2014-03-27 NOTE — Telephone Encounter (Signed)
Pt called, needs an appointment with Dr. Naaman Plummer, claims she had a moment/episode in which she threw out her meds, now regretting her decision. Hoping for an appt to restore her Rx's

## 2014-03-28 ENCOUNTER — Encounter: Payer: Self-pay | Admitting: Registered Nurse

## 2014-03-28 ENCOUNTER — Encounter: Payer: BC Managed Care – PPO | Attending: Physical Medicine & Rehabilitation | Admitting: Registered Nurse

## 2014-03-28 VITALS — BP 128/72 | HR 98 | Resp 14 | Ht 63.0 in | Wt 144.0 lb

## 2014-03-28 DIAGNOSIS — M7918 Myalgia, other site: Secondary | ICD-10-CM

## 2014-03-28 DIAGNOSIS — G4700 Insomnia, unspecified: Secondary | ICD-10-CM | POA: Diagnosis present

## 2014-03-28 DIAGNOSIS — M542 Cervicalgia: Secondary | ICD-10-CM

## 2014-03-28 DIAGNOSIS — M546 Pain in thoracic spine: Secondary | ICD-10-CM | POA: Diagnosis present

## 2014-03-28 DIAGNOSIS — G894 Chronic pain syndrome: Secondary | ICD-10-CM | POA: Diagnosis not present

## 2014-03-28 DIAGNOSIS — Z5181 Encounter for therapeutic drug level monitoring: Secondary | ICD-10-CM

## 2014-03-28 DIAGNOSIS — M791 Myalgia: Secondary | ICD-10-CM | POA: Diagnosis present

## 2014-03-28 DIAGNOSIS — Z79899 Other long term (current) drug therapy: Secondary | ICD-10-CM

## 2014-03-28 DIAGNOSIS — F418 Other specified anxiety disorders: Secondary | ICD-10-CM | POA: Insufficient documentation

## 2014-03-28 MED ORDER — METHOCARBAMOL 500 MG PO TABS: 500.0000 mg | ORAL_TABLET | Freq: Four times a day (QID) | ORAL | Status: DC

## 2014-03-28 MED ORDER — HYDROCODONE-ACETAMINOPHEN 5-325 MG PO TABS: 1.0000 | ORAL_TABLET | Freq: Four times a day (QID) | ORAL | Status: DC | PRN

## 2014-03-28 NOTE — Telephone Encounter (Signed)
Patient was in the office today.  OK with Jessica Horn for Jessica Horn to give her a new prescription

## 2014-03-28 NOTE — Progress Notes (Signed)
Subjective:    Patient ID: Jessica Horn, female    DOB: 02/24/1981, 32 y.o.   MRN: [id number]  HPI: [name] is a 33 year old female who returns for follow up for chronic pain and medication refill. She says her pain is located in her abdomen and entire back. She rates her pain 10. Her current exercise regime is walking daily to the mailbox. Her affect is flat, leaning against the chair. She said last week she had a mental lapse and stopped taking her anti-depressants for three days and flushed her medications down the toilet her Robaxin and Hydrocodone. She denies any suicidal ideation, no plan to harm herself. I checked the [facility] her Hydrocodone 5/325 mg #120 was filled on 03/13/2104. She states she  see's Dr. Valentina Shaggy and her anti-depressants were adjusted and she says the counseling is going well. She is scheduled on 04/18/2014 for Exploratory Laparotomy with left Salpingo oophrectomy and Probable Total Abdominal Hysterectomy Right Salpingectomy . I spoke with Dr. Naaman Plummer we will re-order her medication she was instructed if this happens again we will not re-order medication's she verbalizes understanding.  Pain Inventory Average Pain 10 Pain Right Now 10 My pain is constant, sharp and stabbing  In the last 24 hours, has pain interfered with the following? General activity 10 Relation with others 10 Enjoyment of life 10 What TIME of day is your pain at its worst? ALL Sleep (in general) Poor  Pain is worse with: walking, bending, sitting, inactivity, standing and some activites Pain improves with: medication Relief from Meds: 4  Mobility use a cane how many minutes can you walk? 5 ability to climb steps?  no do you drive?  yes  Function disabled: date disabled .  Neuro/Psych weakness trouble walking dizziness confusion depression anxiety  Prior Studies Any changes since last visit?  no  Physicians  involved in your care Any changes since last visit?  no   Family History  Problem Relation Age of Onset  . Hypertension Mother   . Anemia Mother   . Cancer Maternal Aunt     breast  . Kidney disease Maternal Grandmother   . Diabetes Maternal Grandmother   . Breast cancer Maternal Grandmother 78  . Diabetes Paternal Grandfather   . Lupus Other   . Multiple sclerosis Other   . Breast cancer Sister 60  . Breast cancer Paternal Aunt 57  . Diabetes Maternal Grandfather   . Cancer Paternal Grandmother     NOS  . Breast cancer Paternal Aunt 62  . Breast cancer Paternal Aunt   . Ovarian cancer Paternal Aunt    History   Social History  . Marital Status: Married    Spouse Name: N/A    Number of Children: 1  . Years of Education: N/A   Social History Main Topics  . Smoking status: Never Smoker   . Smokeless tobacco: None  . Alcohol Use: No  . Drug Use: No  . Sexual Activity: Yes   Other Topics Concern  . None   Social History Narrative   Past Surgical History  Procedure Laterality Date  . Tubal ligation    . Cesarean section    . Ovarian cyst removal     Past Medical History  Diagnosis Date  . Fibromyalgia   . Back pain   . Ovarian cyst   . Cancer     breast CA at 33 y/o  . Cancer of spine  spine CA for past 10 years  . Breast cancer   . Depression    BP 128/72 mmHg  Pulse 98  Resp 14  Ht 5\' 3"  (1.6 m)  Wt 144 lb (65.318 kg)  BMI 25.51 kg/m2  SpO2 100%  Opioid Risk Score:   Fall Risk Score: Low Fall Risk (0-5 points)   Review of Systems  Constitutional: Positive for fatigue.  HENT: Negative.   Eyes: Negative.   Respiratory: Negative.   Cardiovascular: Negative.   Gastrointestinal: Positive for nausea, vomiting and abdominal pain.       Poor appetite  Endocrine: Negative.   Genitourinary: Positive for flank pain, vaginal pain and pelvic pain.  Musculoskeletal: Positive for myalgias and back pain.  Skin: Negative.   Allergic/Immunologic:  Negative.   Neurological: Positive for dizziness and weakness.       Trouble walking, confusion  Hematological: Negative.   Psychiatric/Behavioral: Positive for dysphoric mood. The patient is nervous/anxious.        Objective:   Physical Exam  Constitutional: She is oriented to person, place, and time. She appears well-developed and well-nourished.  HENT:  Head: Normocephalic and atraumatic.  Neck: Normal range of motion. Neck supple.  Cardiovascular: Normal rate and regular rhythm.   Pulmonary/Chest: Effort normal and breath sounds normal.  Musculoskeletal:  Normal Muscle Bulk and Muscle testing Reveals: Upper Extremities: Full ROM and Muscle Strength 5/5 Lower Extremities: Full ROM and Muscle strength 5/5 Arises from chair with ease Using straight Cane for support/ Antalgic Gait  Neurological: She is alert and oriented to person, place, and time.  Skin: Skin is warm and dry.  Psychiatric:  Affect: Flat  Nursing note and vitals reviewed.         Assessment & Plan:  1. Chronic cervico-thoracic axial spine pain./Mypfascial Pain: Refilled: Hydrocodone 5/325 mg one tablet every 6 hours as needed #120  2. 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  3. Depression with anxiety: Continue Celexa and Seroquel and Xanax. Dr. Valentina Shaggy Following 4. Insomnia: Continue current medication regime. Continue to Monitor. 5. Hx of breast cancer rx'ed in 2004? With CTX and XRT : GYN Following 6. Hx of Von Willebrand's disease : GYN Following 7. Ovarian Mass: Scheduled for Hysterectomy on 04/18/2014 8. Muscle Spasm: Continue Robaxin   30 min of face to face patient care time was spent during this visit. All questions were encouraged and answered.   F/U in 1 month

## 2014-03-28 NOTE — Telephone Encounter (Signed)
2nd call,  had a mental lapse, threw away her meds, seeing Dr. Valentina Shaggy, [facility], called hoping to get her meds refilled, having a hysterectomy in 2 weeks, pain is already unbearable

## 2014-03-30 DIAGNOSIS — F410 Panic disorder [episodic paroxysmal anxiety] without agoraphobia: Secondary | ICD-10-CM

## 2014-03-30 DIAGNOSIS — F4310 Post-traumatic stress disorder, unspecified: Secondary | ICD-10-CM

## 2014-03-30 DIAGNOSIS — F331 Major depressive disorder, recurrent, moderate: Secondary | ICD-10-CM

## 2014-04-11 ENCOUNTER — Ambulatory Visit: Payer: BC Managed Care – PPO | Admitting: Physical Medicine & Rehabilitation

## 2014-04-13 DIAGNOSIS — F4310 Post-traumatic stress disorder, unspecified: Secondary | ICD-10-CM

## 2014-04-13 DIAGNOSIS — F331 Major depressive disorder, recurrent, moderate: Secondary | ICD-10-CM

## 2014-04-13 DIAGNOSIS — F410 Panic disorder [episodic paroxysmal anxiety] without agoraphobia: Secondary | ICD-10-CM

## 2014-04-17 ENCOUNTER — Ambulatory Visit: Payer: BC Managed Care – PPO | Admitting: Hematology and Oncology

## 2014-04-18 HISTORY — PX: LAPAROSCOPIC ASSISTED VAGINAL HYSTERECTOMY: SHX5398

## 2014-04-24 ENCOUNTER — Other Ambulatory Visit: Payer: Self-pay | Admitting: *Deleted

## 2014-04-24 ENCOUNTER — Encounter: Payer: BLUE CROSS/BLUE SHIELD | Admitting: Registered Nurse

## 2014-04-24 DIAGNOSIS — G4700 Insomnia, unspecified: Secondary | ICD-10-CM

## 2014-04-24 DIAGNOSIS — F32A Depression, unspecified: Secondary | ICD-10-CM

## 2014-04-24 DIAGNOSIS — F329 Major depressive disorder, single episode, unspecified: Secondary | ICD-10-CM

## 2014-04-24 DIAGNOSIS — M546 Pain in thoracic spine: Secondary | ICD-10-CM

## 2014-04-24 DIAGNOSIS — M7918 Myalgia, other site: Secondary | ICD-10-CM

## 2014-04-24 DIAGNOSIS — M542 Cervicalgia: Secondary | ICD-10-CM

## 2014-04-24 DIAGNOSIS — F419 Anxiety disorder, unspecified: Secondary | ICD-10-CM

## 2014-04-24 MED ORDER — METHOCARBAMOL 500 MG PO TABS: 500.0000 mg | ORAL_TABLET | Freq: Four times a day (QID) | ORAL | Status: DC

## 2014-04-24 MED ORDER — HYDROCODONE-ACETAMINOPHEN 5-325 MG PO TABS: 1.0000 | ORAL_TABLET | Freq: Four times a day (QID) | ORAL | Status: DC | PRN

## 2014-04-24 MED ORDER — ALPRAZOLAM 1 MG PO TABS: 0.5000 mg | ORAL_TABLET | Freq: Two times a day (BID) | ORAL | Status: DC | PRN

## 2014-04-24 NOTE — Telephone Encounter (Signed)
Called patient and told her that her husband can pick up RX at the front desk.  Also scheduled next appt with Zella Ball on 05/23/2014

## 2014-04-24 NOTE — Telephone Encounter (Signed)
Patient called Friday and 2 x's today for RX refill.  She is still in the hospital and needs her pain meds.  She was unable to keep her appt for December but will call to schedule for January.  Printed RX's out to be signed by Dr. Naaman Plummer.

## 2014-05-04 DIAGNOSIS — F410 Panic disorder [episodic paroxysmal anxiety] without agoraphobia: Secondary | ICD-10-CM

## 2014-05-04 DIAGNOSIS — F4310 Post-traumatic stress disorder, unspecified: Secondary | ICD-10-CM

## 2014-05-04 DIAGNOSIS — F331 Major depressive disorder, recurrent, moderate: Secondary | ICD-10-CM

## 2014-05-08 ENCOUNTER — Other Ambulatory Visit: Payer: Self-pay | Admitting: Physical Medicine & Rehabilitation

## 2014-05-09 ENCOUNTER — Other Ambulatory Visit: Payer: Self-pay | Admitting: *Deleted

## 2014-05-11 ENCOUNTER — Telehealth: Payer: Self-pay | Admitting: *Deleted

## 2014-05-12 HISTORY — PX: OVARIAN CYST REMOVAL: SHX89

## 2014-05-17 NOTE — Telephone Encounter (Signed)
duplicate

## 2014-05-19 ENCOUNTER — Telehealth: Payer: Self-pay | Admitting: *Deleted

## 2014-05-19 NOTE — Telephone Encounter (Signed)
Pt called, she cannot make her appointment on 05/23/2014, pt had a recent hysterectomy and is unable to drive for 6-8 weeks, pt's husband can drive her on Gwynne Edinger Day Jan 18th, but is unable to drive her any other day as he used his vacation time taking care of her,she said he would be able to come by and p/u a script as he works nearby

## 2014-05-19 NOTE — Telephone Encounter (Signed)
Spoke to Jessica Horn she will be out of her medication befor her next appointment. We will giver her one week bridge until her appointment. She verbalizes understanding. She will call the office on 05/22/2014 to let me know the time her husband will be picking up script.

## 2014-05-19 NOTE — Telephone Encounter (Signed)
Patient rescheduled for Jan 18th, she will run out of her medications, Please advise on how we can help her bridge the gap...Marland KitchenMarland KitchenMarland Kitchenher appt was going to be on the 12th and now it is on the 18th. So she is going to need medication coverage for 6 days

## 2014-05-22 ENCOUNTER — Telehealth: Payer: Self-pay | Admitting: Registered Nurse

## 2014-05-22 ENCOUNTER — Telehealth: Payer: Self-pay | Admitting: *Deleted

## 2014-05-22 DIAGNOSIS — M546 Pain in thoracic spine: Secondary | ICD-10-CM

## 2014-05-22 DIAGNOSIS — M7918 Myalgia, other site: Secondary | ICD-10-CM

## 2014-05-22 DIAGNOSIS — M542 Cervicalgia: Secondary | ICD-10-CM

## 2014-05-22 MED ORDER — METHOCARBAMOL 500 MG PO TABS: 500.0000 mg | ORAL_TABLET | Freq: Four times a day (QID) | ORAL | Status: DC

## 2014-05-22 MED ORDER — HYDROCODONE-ACETAMINOPHEN 5-325 MG PO TABS: 1.0000 | ORAL_TABLET | Freq: Four times a day (QID) | ORAL | Status: DC | PRN

## 2014-05-22 NOTE — Telephone Encounter (Signed)
Jessica Horn called and siad that she was supposed to get a refill on her methocarbamol from [facility] today but [facility] does not have the rx.  I have sent it electronically to the pharmacy.

## 2014-05-22 NOTE — Telephone Encounter (Signed)
Jessica Horn called office today, Her husband will pick up her prescription today during his lunch break. She is aware this is only a week prescription. Her follow up appointment is 05/29/2014.  I checked the [place] Controlled Substance Reporting System: Dr/ Joanna Hews prescribed oxycodone, she had adverse effects to prescription. She was instructed to bring oxycodone to be discarded she verbalizes understanding.

## 2014-05-23 ENCOUNTER — Encounter: Payer: BC Managed Care – PPO | Admitting: Registered Nurse

## 2014-05-29 ENCOUNTER — Encounter: Payer: Self-pay | Admitting: Registered Nurse

## 2014-05-29 ENCOUNTER — Encounter: Payer: BLUE CROSS/BLUE SHIELD | Attending: Physical Medicine & Rehabilitation | Admitting: Registered Nurse

## 2014-05-29 VITALS — BP 132/88 | HR 68 | Resp 14

## 2014-05-29 DIAGNOSIS — G894 Chronic pain syndrome: Secondary | ICD-10-CM

## 2014-05-29 DIAGNOSIS — M546 Pain in thoracic spine: Secondary | ICD-10-CM | POA: Diagnosis present

## 2014-05-29 DIAGNOSIS — M791 Myalgia: Secondary | ICD-10-CM | POA: Diagnosis not present

## 2014-05-29 DIAGNOSIS — F418 Other specified anxiety disorders: Secondary | ICD-10-CM | POA: Insufficient documentation

## 2014-05-29 DIAGNOSIS — Z5181 Encounter for therapeutic drug level monitoring: Secondary | ICD-10-CM | POA: Diagnosis not present

## 2014-05-29 DIAGNOSIS — M7918 Myalgia, other site: Secondary | ICD-10-CM

## 2014-05-29 DIAGNOSIS — M542 Cervicalgia: Secondary | ICD-10-CM

## 2014-05-29 DIAGNOSIS — Z79899 Other long term (current) drug therapy: Secondary | ICD-10-CM

## 2014-05-29 DIAGNOSIS — G4700 Insomnia, unspecified: Secondary | ICD-10-CM | POA: Diagnosis present

## 2014-05-29 MED ORDER — METHOCARBAMOL 500 MG PO TABS: 500.0000 mg | ORAL_TABLET | Freq: Four times a day (QID) | ORAL | Status: DC

## 2014-05-29 MED ORDER — HYDROCODONE-ACETAMINOPHEN 5-325 MG PO TABS: 1.0000 | ORAL_TABLET | Freq: Four times a day (QID) | ORAL | Status: DC | PRN

## 2014-05-29 NOTE — Progress Notes (Signed)
Subjective:    Patient ID: Jessica Horn, female    DOB: 04-05-1981, 34 y.o.   MRN: [id number]  HPI: Jessica Horn is a 34 year old female who returns for follow up for chronic pain and medication refill. She says her pain is located in her abdomen  ( Incision closed) s/p Exploratory Laparotomy with left Salpingo oophrectomy and Probable Total Abdominal Hysterectomy Right Salpingectomy  and upper-mid back. She rates her pain 8. She hasn't followed her usual  exercise regime since her surgery. Husband in room.  Pain Inventory Average Pain 8 Pain Right Now 8 My pain is constant, sharp and stabbing  In the last 24 hours, has pain interfered with the following? General activity 8 Relation with others 8 Enjoyment of life 8 What TIME of day is your pain at its worst? ALL Sleep (in general) Poor  Pain is worse with: walking, bending, standing and some activites Pain improves with: heat/ice and medication Relief from Meds: 7  Mobility walk without assistance how many minutes can you walk? 10 ability to climb steps?  yes do you drive?  yes  Function disabled: date disabled .  Neuro/Psych trouble walking spasms dizziness depression anxiety  Prior Studies Any changes since last visit?  no  Physicians involved in your care Any changes since last visit?  no   Family History  Problem Relation Age of Onset  . Hypertension Mother   . Anemia Mother   . Cancer Maternal Aunt     breast  . Kidney disease Maternal Grandmother   . Diabetes Maternal Grandmother   . Breast cancer Maternal Grandmother 78  . Diabetes Paternal Grandfather   . Lupus Other   . Multiple sclerosis Other   . Breast cancer Sister 67  . Breast cancer Paternal Aunt 61  . Diabetes Maternal Grandfather   . Cancer Paternal Grandmother     NOS  . Breast cancer Paternal Aunt 35  . Breast cancer Paternal Aunt   . Ovarian cancer Paternal Aunt    History   Social History  . Marital  Status: Married    Spouse Name: N/A    Number of Children: 1  . Years of Education: N/A   Social History Main Topics  . Smoking status: Never Smoker   . Smokeless tobacco: None  . Alcohol Use: No  . Drug Use: No  . Sexual Activity: Yes   Other Topics Concern  . None   Social History Narrative   Past Surgical History  Procedure Laterality Date  . Tubal ligation    . Cesarean section    . Ovarian cyst removal     Past Medical History  Diagnosis Date  . Fibromyalgia   . Back pain   . Ovarian cyst   . Cancer     breast CA at 34 y/o  . Cancer of spine     spine CA for past 10 years  . Breast cancer   . Depression    BP 132/88 mmHg  Pulse 68  Resp 14  SpO2 97%  Opioid Risk Score:   Fall Risk Score: Moderate Fall Risk (6-13 points)   Review of Systems  Constitutional: Negative.   HENT: Negative.   Eyes: Negative.   Cardiovascular: Negative.   Gastrointestinal: Negative.   Endocrine: Negative.   Genitourinary: Negative.   Musculoskeletal: Positive for myalgias and back pain.  Skin: Negative.   Allergic/Immunologic: Negative.   Neurological: Positive for dizziness.       Trouble walking,  spasms  Hematological: Negative.   Psychiatric/Behavioral: Positive for dysphoric mood. The patient is nervous/anxious.        Objective:   Physical Exam  Constitutional: She is oriented to person, place, and time. She appears well-developed and well-nourished.  HENT:  Head: Normocephalic and atraumatic.  Neck: Normal range of motion. Neck supple.  Cardiovascular: Normal rate and regular rhythm.   Pulmonary/Chest: Effort normal and breath sounds normal.  Musculoskeletal:  Normal Muscle Bulk and Muscle Testing Reveals: Upper Extremities: Full ROM and Muscle strength 5/5 Back without spinal or paraspinal tenderness Lower extremities: Full ROM and Muscle strength 5/5 Arises from chair with ease Narrow Based gait  Neurological: She is alert and oriented to person,  place, and time.  Skin: Skin is warm and dry.  Psychiatric: She has a normal mood and affect.  Nursing note and vitals reviewed.         Assessment & Plan:  1. Chronic cervico-thoracic axial spine pain./Mypfascial Pain: Refilled: Hydrocodone 5/325 mg one tablet every 6 hours as needed #120 2. 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  3. Depression with anxiety: Continue Celexa and Seroquel and Xanax. Dr. Valentina Shaggy Following 4. Insomnia: Continue current medication regime. Continue to Monitor. 5. Hx of breast cancer rx'ed in 2004? With CTX and XRT : GYN Following 6. Hx of Von Willebrand's disease : GYN Following 7. Ovarian Mass: S/P Hysterectomy on 04/18/2014 8. Muscle Spasm: Continue Robaxin  30 min of face to face patient care time was spent during this visit. All questions were encouraged and answered.   F/U in 1 month

## 2014-06-22 ENCOUNTER — Encounter: Payer: BLUE CROSS/BLUE SHIELD | Admitting: Registered Nurse

## 2014-06-23 ENCOUNTER — Encounter: Payer: Self-pay | Admitting: Registered Nurse

## 2014-06-23 ENCOUNTER — Other Ambulatory Visit: Payer: Self-pay | Admitting: Physical Medicine & Rehabilitation

## 2014-06-23 ENCOUNTER — Encounter: Payer: BLUE CROSS/BLUE SHIELD | Attending: Physical Medicine & Rehabilitation | Admitting: Registered Nurse

## 2014-06-23 VITALS — BP 124/76 | HR 106 | Resp 14

## 2014-06-23 DIAGNOSIS — M542 Cervicalgia: Secondary | ICD-10-CM | POA: Diagnosis present

## 2014-06-23 DIAGNOSIS — F329 Major depressive disorder, single episode, unspecified: Secondary | ICD-10-CM

## 2014-06-23 DIAGNOSIS — M546 Pain in thoracic spine: Secondary | ICD-10-CM | POA: Insufficient documentation

## 2014-06-23 DIAGNOSIS — G894 Chronic pain syndrome: Secondary | ICD-10-CM

## 2014-06-23 DIAGNOSIS — G4700 Insomnia, unspecified: Secondary | ICD-10-CM | POA: Insufficient documentation

## 2014-06-23 DIAGNOSIS — F410 Panic disorder [episodic paroxysmal anxiety] without agoraphobia: Secondary | ICD-10-CM

## 2014-06-23 DIAGNOSIS — Z79899 Other long term (current) drug therapy: Secondary | ICD-10-CM

## 2014-06-23 DIAGNOSIS — F4310 Post-traumatic stress disorder, unspecified: Secondary | ICD-10-CM

## 2014-06-23 DIAGNOSIS — F418 Other specified anxiety disorders: Secondary | ICD-10-CM | POA: Insufficient documentation

## 2014-06-23 DIAGNOSIS — Z5181 Encounter for therapeutic drug level monitoring: Secondary | ICD-10-CM

## 2014-06-23 DIAGNOSIS — M791 Myalgia: Secondary | ICD-10-CM | POA: Diagnosis not present

## 2014-06-23 DIAGNOSIS — F32A Depression, unspecified: Secondary | ICD-10-CM

## 2014-06-23 DIAGNOSIS — F419 Anxiety disorder, unspecified: Secondary | ICD-10-CM

## 2014-06-23 DIAGNOSIS — F331 Major depressive disorder, recurrent, moderate: Secondary | ICD-10-CM

## 2014-06-23 DIAGNOSIS — M7918 Myalgia, other site: Secondary | ICD-10-CM

## 2014-06-23 MED ORDER — QUETIAPINE FUMARATE 100 MG PO TABS: 100.0000 mg | ORAL_TABLET | Freq: Every day | ORAL | Status: DC

## 2014-06-23 MED ORDER — HYDROCODONE-ACETAMINOPHEN 5-325 MG PO TABS: 1.0000 | ORAL_TABLET | Freq: Four times a day (QID) | ORAL | Status: DC | PRN

## 2014-06-23 MED ORDER — METHOCARBAMOL 500 MG PO TABS: 500.0000 mg | ORAL_TABLET | Freq: Four times a day (QID) | ORAL | Status: DC

## 2014-06-23 NOTE — Progress Notes (Signed)
Subjective:    Patient ID: Jessica Horn, female    DOB: 05/29/1980, 34 y.o.   MRN: [id number]  HPI: Jessica Horn is a 34 year old female who returns for follow up for chronic pain and medication refill. She says her pain is located in her abdomen  ( Incision closed) s/p Exploratory Laparotomy with left Salpingo oophrectomy and Probable Total Abdominal Hysterectomy Right Salpingectomy and mid back. She rates her pain 10. Her current exercise regime is walking. She's leaving for a cruise to [place] [employer]. Tachycardic apical pulse checked 100.   Pain Inventory Average Pain 10 Pain Right Now 10 My pain is constant, sharp and stabbing  In the last 24 hours, has pain interfered with the following? General activity 9 Relation with others 9 Enjoyment of life 9 What TIME of day is your pain at its worst? ALL Sleep (in general) Poor  Pain is worse with: walking, bending, sitting, standing and some activites Pain improves with: medication Relief from Meds: 7  Mobility use a cane how many minutes can you walk? 10 ability to climb steps?  yes do you drive?  yes  Function not employed: date last employed .  Neuro/Psych numbness dizziness depression anxiety  Prior Studies Any changes since last visit?  no  Physicians involved in your care Any changes since last visit?  no   Family History  Problem Relation Age of Onset  . Hypertension Mother   . Anemia Mother   . Cancer Maternal Aunt     breast  . Kidney disease Maternal Grandmother   . Diabetes Maternal Grandmother   . Breast cancer Maternal Grandmother 78  . Diabetes Paternal Grandfather   . Lupus Other   . Multiple sclerosis Other   . Breast cancer Sister 53  . Breast cancer Paternal Aunt 22  . Diabetes Maternal Grandfather   . Cancer Paternal Grandmother     NOS  . Breast cancer Paternal Aunt 66  . Breast cancer Paternal Aunt   . Ovarian cancer Paternal Aunt    History   Social  History  . Marital Status: Married    Spouse Name: N/A  . Number of Children: 1  . Years of Education: N/A   Social History Main Topics  . Smoking status: Never Smoker   . Smokeless tobacco: Not on file  . Alcohol Use: No  . Drug Use: No  . Sexual Activity: Yes   Other Topics Concern  . None   Social History Narrative   Past Surgical History  Procedure Laterality Date  . Tubal ligation    . Cesarean section    . Ovarian cyst removal     Past Medical History  Diagnosis Date  . Fibromyalgia   . Back pain   . Ovarian cyst   . Cancer     breast CA at 34 y/o  . Cancer of spine     spine CA for past 10 years  . Breast cancer   . Depression    BP 124/76 mmHg  Pulse 106  Resp 14  SpO2 99%  Opioid Risk Score:   Fall Risk Score: Moderate Fall Risk (6-13 points)  Review of Systems  HENT: Negative.   Eyes: Negative.   Respiratory: Negative.   Cardiovascular: Negative.   Gastrointestinal: Negative.   Endocrine: Negative.   Genitourinary: Negative.   Musculoskeletal: Positive for myalgias, back pain and arthralgias.  Allergic/Immunologic: Negative.   Neurological: Positive for dizziness, weakness and numbness.  Hematological: Negative.  Psychiatric/Behavioral: Positive for dysphoric mood. The patient is nervous/anxious.        Objective:   Physical Exam  Constitutional: She is oriented to person, place, and time. She appears well-developed and well-nourished.  HENT:  Head: Normocephalic and atraumatic.  Neck: Normal range of motion. Neck supple.  Cardiovascular: Normal rate and regular rhythm.   Musculoskeletal:  Normal Muscle Bulk and Muscle Testing Reveals: Upper Extremities: Full ROM and Muscle Strength 5/5 Thoracic Paraspinal Tenderness: T-4-T-7 Lower Extremities: Full ROM and Muscle strength 5/5 Arises from chair with ease using straight cane for support. Narrow Based Gait  Neurological: She is alert and oriented to person, place, and time.  Skin:  Skin is warm and dry.  Psychiatric: She has a normal mood and affect.  Nursing note and vitals reviewed.         Assessment & Plan:  1. Chronic cervico-thoracic axial spine pain./Mypfascial Pain: Refilled: Hydrocodone 5/325 mg one tablet every 6 hours as needed #120 2. 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  3. Depression with anxiety: Continue Celexa and Seroquel and Xanax. Dr. Valentina Shaggy Following 4. Insomnia: Continue current medication regime. Continue to Monitor. 5. Hx of breast cancer rx'ed in 2004? With CTX and XRT : GYN Following 6. Hx of Von Willebrand's disease : GYN Following 7. Ovarian Mass: S/P Hysterectomy on 04/18/2014 8. Muscle Spasm: Continue Robaxin  30 min of face to face patient care time was spent during this visit. All questions were encouraged and answered.   F/U in 1 month

## 2014-06-24 LAB — PMP ALCOHOL METABOLITE (ETG): Ethyl Glucuronide (EtG): NEGATIVE ng/mL

## 2014-06-28 LAB — BENZODIAZEPINES (GC/LC/MS), URINE
ALPRAZOLAMU: NEGATIVE ng/mL — AB (ref <25)
CLONAZEPAU: NEGATIVE ng/mL (ref <25)
Flurazepam metabolite (GC/LC/MS), ur confirm: NEGATIVE ng/mL (ref <50)
Lorazepam (GC/LC/MS), ur confirm: NEGATIVE ng/mL (ref <50)
Midazolam (GC/LC/MS), ur confirm: NEGATIVE ng/mL (ref <50)
NORDIAZEPAMU: NEGATIVE ng/mL (ref <50)
Oxazepam (GC/LC/MS), ur confirm: NEGATIVE ng/mL (ref <50)
Temazepam (GC/LC/MS), ur confirm: NEGATIVE ng/mL (ref <50)
Triazolam metabolite (GC/LC/MS), ur confirm: NEGATIVE ng/mL (ref <50)

## 2014-06-28 LAB — MEPERIDINE (GC/LC/MS), URINE
Meperidine (GC/LC/MS), ur confirm: NEGATIVE ng/mL (ref <100)
NORMEPERIDINE UR CONFIRM: NEGATIVE ng/mL (ref <100)

## 2014-06-29 ENCOUNTER — Ambulatory Visit: Payer: BLUE CROSS/BLUE SHIELD | Attending: Psychology | Admitting: Psychology

## 2014-06-29 LAB — PRESCRIPTION MONITORING PROFILE (SOLSTAS)
AMPHETAMINE/METH: NEGATIVE ng/mL
Barbiturate Screen, Urine: NEGATIVE ng/mL
Buprenorphine, Urine: NEGATIVE ng/mL
CARISOPRODOL, URINE: NEGATIVE ng/mL
CREATININE, URINE: 281.06 mg/dL (ref >20.0)
Cannabinoid Scrn, Ur: NEGATIVE ng/mL
Cocaine Metabolites: NEGATIVE ng/mL
ECSTASY: NEGATIVE ng/mL
Fentanyl, Ur: NEGATIVE ng/mL
Methadone Screen, Urine: NEGATIVE ng/mL
Nitrites, Initial: NEGATIVE ug/mL
OPIATE SCREEN, URINE: NEGATIVE ng/mL
OXYCODONE SCRN UR: NEGATIVE ng/mL
PROPOXYPHENE: NEGATIVE ng/mL
TAPENTADOLUR: NEGATIVE ng/mL
Tramadol Scrn, Ur: NEGATIVE ng/mL
ZOLPIDEM, URINE: NEGATIVE ng/mL
pH, Initial: 5.8 pH (ref 4.5–8.9)

## 2014-07-04 ENCOUNTER — Ambulatory Visit (INDEPENDENT_AMBULATORY_CARE_PROVIDER_SITE_OTHER): Payer: BLUE CROSS/BLUE SHIELD | Admitting: Psychology

## 2014-07-04 ENCOUNTER — Telehealth: Payer: Self-pay | Admitting: *Deleted

## 2014-07-04 DIAGNOSIS — F321 Major depressive disorder, single episode, moderate: Secondary | ICD-10-CM

## 2014-07-04 DIAGNOSIS — F410 Panic disorder [episodic paroxysmal anxiety] without agoraphobia: Secondary | ICD-10-CM

## 2014-07-04 DIAGNOSIS — F4310 Post-traumatic stress disorder, unspecified: Secondary | ICD-10-CM

## 2014-07-04 NOTE — Telephone Encounter (Signed)
Called patient and advised that Dr. Naaman Plummer suggested 3000 mg Tylenol daily - short term. I also advised her to try the [place] OTC patches and we moved up her appt because she is in a lot of pain.  She did not ask for more Hydrocodone.  She verbalized understanding and she will come in on Thursday to see Richards.  As per patient the airline did find her luggage however it will take 2-3 weeks for her to get it via mail

## 2014-07-04 NOTE — Telephone Encounter (Signed)
i assume she's referring to hydrocodone?---tylenol would be the only otc substitute she could take given her medical history---up to 3000mg  daily in the SHORT term.

## 2014-07-04 NOTE — Telephone Encounter (Signed)
Pt says she went on a trip recently, had to check her bag at the last minute. Pt says the airlline lost her luggage with her possessions and her medication. She understands that she cannot get her meds refilled until her next appt. Is asking what OTC meds she can take to help her until then.....(message cut off)

## 2014-07-04 NOTE — Progress Notes (Signed)
Psychology Progress Note   Name: Ellyce Lafevers MRN: [id number]  07/04/2014 (24m) psychotherapy Javon Pyeatt is a 34 year-old African-American woman who was referred for psychological evaluation by Alger Simons, MD of [facility]. Her health history was notable for chronic myofascial pain/fibromyalgia, gait disorder with tremor, abdominal pain due to ovarian cysts and history of breast and spinal cancer.She was initially seen here on 02/09/14.The undersigned clinician diagnosed her with Major Depressive Disorder, Panic Disorder and Posttraumatic Stress Disorder. Behavioral health medications include alprazolam and citalopram as prescribed by Dr. Naaman Plummer. She has since been coming regularly for individual psychotherapy. Further background can be found in her paper chart.  Session focus today was on issues in her marital relationship. Mood reported as stable. No suicidal thoughts or panic anxiety of late.  Will consider again inviting her husband in for a joint session.   Return in one week.   Jamey Ripa, Ph.D Licensed Psychologist

## 2014-07-06 ENCOUNTER — Encounter: Payer: Self-pay | Admitting: Registered Nurse

## 2014-07-06 ENCOUNTER — Encounter (HOSPITAL_BASED_OUTPATIENT_CLINIC_OR_DEPARTMENT_OTHER): Payer: BLUE CROSS/BLUE SHIELD | Admitting: Registered Nurse

## 2014-07-06 VITALS — BP 143/60 | HR 102 | Resp 14

## 2014-07-06 DIAGNOSIS — G894 Chronic pain syndrome: Secondary | ICD-10-CM

## 2014-07-06 DIAGNOSIS — M7918 Myalgia, other site: Secondary | ICD-10-CM

## 2014-07-06 DIAGNOSIS — M546 Pain in thoracic spine: Secondary | ICD-10-CM

## 2014-07-06 DIAGNOSIS — M791 Myalgia: Secondary | ICD-10-CM

## 2014-07-06 DIAGNOSIS — Z5181 Encounter for therapeutic drug level monitoring: Secondary | ICD-10-CM | POA: Diagnosis not present

## 2014-07-06 DIAGNOSIS — F418 Other specified anxiety disorders: Secondary | ICD-10-CM | POA: Diagnosis not present

## 2014-07-06 DIAGNOSIS — Z79899 Other long term (current) drug therapy: Secondary | ICD-10-CM

## 2014-07-06 DIAGNOSIS — M542 Cervicalgia: Secondary | ICD-10-CM

## 2014-07-06 MED ORDER — HYDROCODONE-ACETAMINOPHEN 5-325 MG PO TABS: 1.0000 | ORAL_TABLET | Freq: Four times a day (QID) | ORAL | Status: DC | PRN

## 2014-07-06 MED ORDER — METHOCARBAMOL 500 MG PO TABS: 500.0000 mg | ORAL_TABLET | Freq: Four times a day (QID) | ORAL | Status: DC

## 2014-07-06 NOTE — Progress Notes (Signed)
Subjective:    Patient ID: Jessica Horn, female    DOB: 19-Jul-1980, 34 y.o.   MRN: [id number]  HPI: Jessica Horn is a 34 year old female who returns for follow up for chronic pain and medication refill. She says her pain is located in her abdomen  ( Incision closed) s/p Exploratory Laparotomy with left Salpingo oophrectomy and Probable Total Abdominal Hysterectomy Right Salpingectomy and mid back. She rates her pain 10. Her current exercise regime is walking and performing stretching exercises. [name] flew to [place], upon her return flight her luggage was missing her hydrocodone was in her luggage. She brought  a Letter from [employer] stating they believed they found her missing luggage it was still in [place]. Instructed if her hydrocodone is in her luggage to see if the pharmacist can verify it's hydrocodone if they won't she can call the office and we will. She verbalizes understanding. She arrived to office tachycardic HR102 pulse re-checked 90 blood pressure 143/60 and re-checked 137/84.    Pain Inventory Average Pain 10 Pain Right Now 10 My pain is sharp and stabbing  In the last 24 hours, has pain interfered with the following? General activity 1 Relation with others 1 Enjoyment of life 1 What TIME of day is your pain at its worst? all Sleep (in general) Fair  Pain is worse with: no selection Pain improves with: medication Relief from Meds: 8  Mobility walk with assistance use a cane how many minutes can you walk? 10 ability to climb steps?  yes do you drive?  yes Do you have any goals in this area?  no  Function not employed: date last employed . I need assistance with the following:  meal prep, household duties and shopping Do you have any goals in this area?  no  Neuro/Psych weakness  Prior Studies Any changes since last visit?  no  Physicians involved in your care Any changes since last visit?  no   Family  History  Problem Relation Age of Onset  . Hypertension Mother   . Anemia Mother   . Cancer Maternal Aunt     breast  . Kidney disease Maternal Grandmother   . Diabetes Maternal Grandmother   . Breast cancer Maternal Grandmother 78  . Diabetes Paternal Grandfather   . Lupus Other   . Multiple sclerosis Other   . Breast cancer Sister 70  . Breast cancer Paternal Aunt 61  . Diabetes Maternal Grandfather   . Cancer Paternal Grandmother     NOS  . Breast cancer Paternal Aunt 45  . Breast cancer Paternal Aunt   . Ovarian cancer Paternal Aunt    History   Social History  . Marital Status: Married    Spouse Name: N/A  . Number of Children: 1  . Years of Education: N/A   Social History Main Topics  . Smoking status: Never Smoker   . Smokeless tobacco: Not on file  . Alcohol Use: No  . Drug Use: No  . Sexual Activity: Yes   Other Topics Concern  . None   Social History Narrative   Past Surgical History  Procedure Laterality Date  . Tubal ligation    . Cesarean section    . Ovarian cyst removal     Past Medical History  Diagnosis Date  . Fibromyalgia   . Back pain   . Ovarian cyst   . Cancer     breast CA at 34 y/o  . Cancer of  spine     spine CA for past 10 years  . Breast cancer   . Depression    BP 143/60 mmHg  Pulse 102  Resp 14  SpO2 97%  Opioid Risk Score:   Fall Risk Score: Moderate Fall Risk (6-13 points) (patient previously educated)  Review of Systems  Constitutional:       Weight gain  Gastrointestinal: Positive for nausea and vomiting.  Neurological: Positive for weakness.  All other systems reviewed and are negative.      Objective:   Physical Exam  Constitutional: She is oriented to person, place, and time. She appears well-developed and well-nourished.  HENT:  Head: Normocephalic and atraumatic.  Neck: Normal range of motion. Neck supple.  Cardiovascular: Normal rate and regular rhythm.   Pulmonary/Chest: Effort normal and breath  sounds normal.  Musculoskeletal:  Normal Muscle Bulk and Muscle Testing Reveals: Upper Extremities: Full ROM and Muscle Strength 5/5 Thoracic Paraspinal tenderness: T-5- T-8 Lower Extremities: Full ROM and Muscle strength 5/5 Arises from chair with ease using straight cane for support.   Neurological: She is alert and oriented to person, place, and time.  Skin: Skin is warm and dry.  Psychiatric: She has a normal mood and affect.  Nursing note and vitals reviewed.         Assessment & Plan:  1. Chronic cervico-thoracic axial spine pain./Mypfascial Pain: Refilled: Hydrocodone 5/325 mg one tablet every 6 hours as needed #120 2. 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  3. Depression with anxiety: Continue Celexa and Seroquel and Xanax. Dr. Valentina Shaggy Following 4. Insomnia: Continue current medication regime. Continue to Monitor. 5. Hx of breast cancer rx'ed in 2004? With CTX and XRT : GYN Following 6. Hx of Von Willebrand's disease : GYN Following 7. Ovarian Mass: S/P Hysterectomy on 04/18/2014 8. Muscle Spasm: Continue Robaxin  30 min of face to face patient care time was spent during this visit. All questions were encouraged and answered.   F/U in 1 month

## 2014-07-14 ENCOUNTER — Ambulatory Visit: Payer: BLUE CROSS/BLUE SHIELD | Attending: Psychology | Admitting: Psychology

## 2014-07-14 DIAGNOSIS — F4310 Post-traumatic stress disorder, unspecified: Secondary | ICD-10-CM

## 2014-07-14 DIAGNOSIS — F339 Major depressive disorder, recurrent, unspecified: Secondary | ICD-10-CM

## 2014-07-14 DIAGNOSIS — F331 Major depressive disorder, recurrent, moderate: Secondary | ICD-10-CM

## 2014-07-14 NOTE — Progress Notes (Signed)
Urine drug screen for this encounter is inconsistent for prescribed medication She reported she last took the hydrocodone 06/22/14 and there is no evidence of metabolite on 06/23/14.

## 2014-07-14 NOTE — Progress Notes (Addendum)
Progress Note   Name: Jessica Horn MRN: [id number]  Date:07/14/2014 (41m) psychotherapy [name] continues to report depressed mood, low self-esteem, feelings of social alienation from family and friends, and marital difficulties. She denied suicidal ideation.  She reported that she recently "forgot" to take her antidepressant medication for a few days then after realizing this decided not to continue talking it.She did not report any subsequent physical or emotional changes. She stated her belief that she would probably benefit from taking it and suggested that her actions were a form of self-punishment. I reviewed the rationale for antidepressant use and the dangers of stopping abruptly.    Therapeutic interventions included disputing her fixed beliefs about herself and others, reframing and working on self-image. 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 Will give her some names of providers at our next session.  Current diagnoses: Major Depressive Disorder, recurrent, moderate [F33.1] Post Traumatic Stress Disorder [F43.10]   Return on 07/19/14.  Jamey Ripa, Ph.D Licensed Psychologist

## 2014-07-19 ENCOUNTER — Ambulatory Visit (INDEPENDENT_AMBULATORY_CARE_PROVIDER_SITE_OTHER): Payer: BLUE CROSS/BLUE SHIELD | Admitting: Psychology

## 2014-07-19 ENCOUNTER — Ambulatory Visit: Payer: BLUE CROSS/BLUE SHIELD | Admitting: Physical Medicine & Rehabilitation

## 2014-07-19 DIAGNOSIS — F339 Major depressive disorder, recurrent, unspecified: Secondary | ICD-10-CM

## 2014-07-19 DIAGNOSIS — F4310 Post-traumatic stress disorder, unspecified: Secondary | ICD-10-CM

## 2014-07-19 DIAGNOSIS — F331 Major depressive disorder, recurrent, moderate: Secondary | ICD-10-CM

## 2014-07-19 NOTE — Progress Notes (Signed)
Progress Note   Name: Jessica Horn MRN: [id number]  Date:07/19/2014 (40m) psychotherapy  Caretha continues to report depressed mood though is having more positive and accepting thoughts about herself. No recent report of panic anxiety. Her level of everyday functioning has improved. She denied suicidal ideation.  Gave her the names of two local psychotherapists for possible marital counselling.   Therapeutic approach continues to be a mixture of insight-oriented and cognitive-behavioral techniques.   Current diagnoses: Major Depressive Disorder, recurrent, moderate [F33.1] Post Traumatic Stress Disorder [F43.10]  Return on 07/27/14.   Jamey Ripa, Ph.D Licensed Psychologist

## 2014-07-27 ENCOUNTER — Ambulatory Visit (INDEPENDENT_AMBULATORY_CARE_PROVIDER_SITE_OTHER): Payer: BLUE CROSS/BLUE SHIELD | Admitting: Psychology

## 2014-07-27 DIAGNOSIS — F410 Panic disorder [episodic paroxysmal anxiety] without agoraphobia: Secondary | ICD-10-CM

## 2014-07-27 DIAGNOSIS — F331 Major depressive disorder, recurrent, moderate: Secondary | ICD-10-CM

## 2014-07-27 DIAGNOSIS — F339 Major depressive disorder, recurrent, unspecified: Secondary | ICD-10-CM

## 2014-07-27 DIAGNOSIS — F4310 Post-traumatic stress disorder, unspecified: Secondary | ICD-10-CM

## 2014-07-27 NOTE — Progress Notes (Signed)
[facility]  [address]   Telephone [phone] [address] Fax [phone] [place], [place] [address]   Psychology Progress Note   Name:  Clydean Posas Date of Birth:  1981-04-14 MRN:  [id number]  Date:07/27/2014 (16m) psychotherapy Shemica reports lower symptomatic distress levels as compared to prior to starting psychological services. She reports improved self-image and decreased negative ruminations though continues to feel pessimistic about her future and cites a high level of life dissatisfaction. She continues to have panic attacks about twice per day though of shorter duration than before. She no longer expects to have a shortened lifespan due to her medical condition and no longer ruminates on death. She continues to report being in constant pain with no change in her functional abilities.   Plan: She and her husband will seek marital counseling. 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   Diagnostic Impressions Major Depressive Disorder, Recurrent, Moderate [F33.1] Posttraumatic Stress Disorder [F43.10] Panic Disorder [41.0]   Return on 08/08/14.   Jamey Ripa, Ph.D Licensed Psychologist

## 2014-07-28 ENCOUNTER — Encounter: Payer: Self-pay | Admitting: Registered Nurse

## 2014-07-28 ENCOUNTER — Other Ambulatory Visit: Payer: Self-pay | Admitting: Registered Nurse

## 2014-07-28 ENCOUNTER — Encounter: Payer: BLUE CROSS/BLUE SHIELD | Admitting: Registered Nurse

## 2014-07-28 ENCOUNTER — Encounter: Payer: BLUE CROSS/BLUE SHIELD | Attending: Physical Medicine & Rehabilitation | Admitting: Registered Nurse

## 2014-07-28 VITALS — BP 131/72 | HR 105 | Resp 14

## 2014-07-28 DIAGNOSIS — Z5181 Encounter for therapeutic drug level monitoring: Secondary | ICD-10-CM

## 2014-07-28 DIAGNOSIS — M791 Myalgia: Secondary | ICD-10-CM | POA: Diagnosis present

## 2014-07-28 DIAGNOSIS — G4700 Insomnia, unspecified: Secondary | ICD-10-CM | POA: Diagnosis present

## 2014-07-28 DIAGNOSIS — F418 Other specified anxiety disorders: Secondary | ICD-10-CM | POA: Insufficient documentation

## 2014-07-28 DIAGNOSIS — M7918 Myalgia, other site: Secondary | ICD-10-CM

## 2014-07-28 DIAGNOSIS — G894 Chronic pain syndrome: Secondary | ICD-10-CM

## 2014-07-28 DIAGNOSIS — Z79899 Other long term (current) drug therapy: Secondary | ICD-10-CM | POA: Diagnosis not present

## 2014-07-28 DIAGNOSIS — M542 Cervicalgia: Secondary | ICD-10-CM | POA: Diagnosis present

## 2014-07-28 DIAGNOSIS — M546 Pain in thoracic spine: Secondary | ICD-10-CM | POA: Diagnosis present

## 2014-07-28 MED ORDER — HYDROCODONE-ACETAMINOPHEN 5-325 MG PO TABS: 1.0000 | ORAL_TABLET | Freq: Four times a day (QID) | ORAL | Status: DC | PRN

## 2014-07-28 NOTE — Progress Notes (Signed)
Subjective:    Patient ID: Jessica Horn, female    DOB: [birth year], 34 y.o.   MRN: [id number]  HPI:[name] is a 34 year old female who returns for follow up for chronic pain and medication refill. She says her pain is located in hermid back. She rates her pain 10. Her current exercise regime is walking and performing stretching exercises. She's walking to mailbox daily.   She was playing with her daughter last month, her daughter mistakenly bumped her abdominal incision site. She went to [facility] was diagnosed with abdominal contusion, she was prescribed nabumetone 750 mg  ( antiinflammatory)twice a day she has completed the therapy.  Pain Inventory Average Pain 10 Pain Right Now 10 My pain is intermittent, sharp, dull and stabbing  In the last 24 hours, has pain interfered with the following? General activity 6 Relation with others 6 Enjoyment of life 6 What TIME of day is your pain at its worst? all Sleep (in general) Poor  Pain is worse with: unsure Pain improves with: medication Relief from Meds: 8  Mobility walk with assistance use a cane how many minutes can you walk? 15 ability to climb steps?  yes do you drive?  yes Do you have any goals in this area?  no  Function not employed: date last employed . I need assistance with the following:  meal prep, household duties and shopping Do you have any goals in this area?  no  Neuro/Psych No problems in this area  Prior Studies Any changes since last visit?  no  Physicians involved in your care Any changes since last visit?  no   Family History  Problem Relation Age of Onset  . Hypertension Mother   . Anemia Mother   . Cancer Maternal Aunt     breast  . Kidney disease Maternal Grandmother   . Diabetes Maternal Grandmother   . Breast cancer Maternal Grandmother 78  . Diabetes Paternal Grandfather   . Lupus Other   . Multiple sclerosis Other   . Breast cancer Sister 9  .  Breast cancer Paternal Aunt 71  . Diabetes Maternal Grandfather   . Cancer Paternal Grandmother     NOS  . Breast cancer Paternal Aunt 41  . Breast cancer Paternal Aunt   . Ovarian cancer Paternal Aunt    History   Social History  . Marital Status: Married    Spouse Name: N/A  . Number of Children: 1  . Years of Education: N/A   Social History Main Topics  . Smoking status: Never Smoker   . Smokeless tobacco: Not on file  . Alcohol Use: No  . Drug Use: No  . Sexual Activity: Yes   Other Topics Concern  . None   Social History Narrative   Past Surgical History  Procedure Laterality Date  . Tubal ligation    . Cesarean section    . Ovarian cyst removal     Past Medical History  Diagnosis Date  . Fibromyalgia   . Back pain   . Ovarian cyst   . Cancer     breast CA at 34 y/o  . Cancer of spine     spine CA for past 10 years  . Breast cancer   . Depression    BP 131/72 mmHg  Pulse 105  Resp 14  SpO2 99%  Opioid Risk Score:   Fall Risk Score: Moderate Fall Risk (6-13 points)`1  Depression screen PHQ 2/9  Depression  screen PHQ 2/9 07/28/2014  Decreased Interest 1  Down, Depressed, Hopeless 0  PHQ - 2 Score 1  Altered sleeping 3  Tired, decreased energy 2  Change in appetite 3  Feeling bad or failure about yourself  0  Trouble concentrating 1  Moving slowly or fidgety/restless 0  Suicidal thoughts 0  PHQ-9 Score 10     Review of Systems  Gastrointestinal: Positive for vomiting, abdominal pain and constipation.  All other systems reviewed and are negative.      Objective:   Physical Exam  Constitutional: She is oriented to person, place, and time. She appears well-developed and well-nourished.  HENT:  Head: Normocephalic and atraumatic.  Neck: Normal range of motion. Neck supple.  Cardiovascular: Normal rate and regular rhythm.   Pulmonary/Chest: Effort normal and breath sounds normal.  Musculoskeletal:  Normal Muscle Bulk and Muscle testing  Reveals: Upper Extremities: Full ROM and Muscle Strength 5/5 Thoracic Paraspinal Tenderness: T-6- T-7 Lower Extremities: Full ROM and Muscle Strength 5/5 Arises from chair with ease using straight cane for support. Narrow Based gait  Neurological: She is alert and oriented to person, place, and time.  Skin: Skin is warm and dry.  Psychiatric: She has a normal mood and affect.  Nursing note and vitals reviewed.         Assessment & Plan:  1. Chronic cervico-thoracic axial spine pain./Mypfascial Pain: Refilled: Hydrocodone 5/325 mg one tablet every 6 hours as needed #120 2. 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  3. Depression with anxiety: Continue Celexa and Seroquel and Xanax. Dr. Valentina Shaggy Following 4. Insomnia: Continue current medication regime. Continue to Monitor. 5. Hx of breast cancer rx'ed in 2004? With CTX and XRT : GYN Following 6. Hx of Von Willebrand's disease : GYN Following 7. Ovarian Mass: S/P Hysterectomy on 04/18/2014 8. Muscle Spasm: Continue Robaxin  20 min of face to face patient care time was spent during this visit. All questions were encouraged and answered.   F/U in 1 month

## 2014-07-29 LAB — PMP ALCOHOL METABOLITE (ETG)

## 2014-08-01 LAB — PRESCRIPTION MONITORING PROFILE (SOLSTAS)
Amphetamine/Meth: NEGATIVE ng/mL
BARBITURATE SCREEN, URINE: NEGATIVE ng/mL
BENZODIAZEPINE SCREEN, URINE: NEGATIVE ng/mL
BUPRENORPHINE, URINE: NEGATIVE ng/mL
COCAINE METABOLITES: NEGATIVE ng/mL
Cannabinoid Scrn, Ur: NEGATIVE ng/mL
Carisoprodol, Urine: NEGATIVE ng/mL
Creatinine, Urine: 157.67 mg/dL (ref >20.0)
FENTANYL URINE: NEGATIVE ng/mL
MDMA URINE: NEGATIVE ng/mL
Meperidine, Ur: NEGATIVE ng/mL
Methadone Screen, Urine: NEGATIVE ng/mL
Nitrites, Initial: NEGATIVE ug/mL
OPIATE SCREEN, URINE: NEGATIVE ng/mL
Oxycodone Screen, Ur: NEGATIVE ng/mL
PH URINE, INITIAL: 5.7 pH (ref 4.5–8.9)
Propoxyphene: NEGATIVE ng/mL
TRAMADOL UR: NEGATIVE ng/mL
Tapentadol, urine: NEGATIVE ng/mL
ZOLPIDEM, URINE: NEGATIVE ng/mL

## 2014-08-01 LAB — ETHYL GLUCURONIDE, URINE
Ethyl Glucuronide (EtG): 11363 ng/mL — ABNORMAL HIGH (ref <500)
Ethyl Sulfate (ETS): 2257 ng/mL — ABNORMAL HIGH (ref <100)

## 2014-08-02 ENCOUNTER — Telehealth: Payer: Self-pay | Admitting: *Deleted

## 2014-08-02 NOTE — Telephone Encounter (Signed)
Pt saw Zella Ball recently, has been taking the same meds for several years, she is reporting that she is suddenly having adverse reactions to one or both of her medications (hydrocodone, methocarbomol). She is throwing up 30 minutes after taking her meds. Stomach is upset and having bad diarrhea Asking for Zella Ball to please call back

## 2014-08-02 NOTE — Telephone Encounter (Signed)
Called Mrs. Bole and left a message for her to return the call.

## 2014-08-03 NOTE — Telephone Encounter (Signed)
Spoke with Jessica Horn, Instructed to take hydrocodone and Robaxin separate. Also encouraged her to take with meals. Instructed to call office on Monday 08/07/14  For further instructions. She verbalizes understanding.  Could be viral she verbalizes understanding.

## 2014-08-03 NOTE — Telephone Encounter (Signed)
Shelma called back this morning returning Eunice's call.

## 2014-08-07 ENCOUNTER — Telehealth: Payer: Self-pay | Admitting: *Deleted

## 2014-08-07 ENCOUNTER — Telehealth: Payer: Self-pay | Admitting: Registered Nurse

## 2014-08-07 MED ORDER — OXYCODONE HCL 5 MG PO TABS: 5.0000 mg | ORAL_TABLET | Freq: Three times a day (TID) | ORAL | Status: DC | PRN

## 2014-08-07 MED ORDER — METOCLOPRAMIDE HCL 5 MG PO TABS: 5.0000 mg | ORAL_TABLET | Freq: Three times a day (TID) | ORAL | Status: DC | PRN

## 2014-08-07 NOTE — Telephone Encounter (Signed)
Spoke with Mrs. Lourenco regarding her nausea and vomiting. She has been experiencing nausea and vomiting after hydrocodone for 7 days or more. She stopped taking the hydrocodone and her nausea and vomiting has stopped. Will consider changing her hydrocodone to oxycodone after speaking with Dr. Naaman Plummer. She verbalizes understanding.   She will be changed to oxycodone 5 mg one tablet every 8 hours as needed for pain. She was instructed to return the hydrocodone this will be discarded and new script will be given. She verbalizes understanding  Also spoke to her regarding positive ETOH, she admits to drinking a large can of beer.  Educated on the narcotic policy she verbalizes understanding. She has been informed if ETOH is positive again she will be dismissed from practice she verbalizes understanding.

## 2014-08-07 NOTE — Telephone Encounter (Signed)
error 

## 2014-08-07 NOTE — Telephone Encounter (Signed)
Requesting Jessica Horn please call her back. She has been vomiting all weekend after taking hydrocodone

## 2014-08-07 NOTE — Progress Notes (Signed)
Urine drug screen is positive for alcohol and negative of any metabolites of her medication. Last dose of hydrocodone day before testing.

## 2014-08-08 ENCOUNTER — Ambulatory Visit: Payer: BLUE CROSS/BLUE SHIELD | Admitting: Psychology

## 2014-08-10 ENCOUNTER — Telehealth: Payer: Self-pay | Admitting: *Deleted

## 2014-08-10 MED ORDER — HYDROCODONE-ACETAMINOPHEN 5-325 MG PO TABS: 1.0000 | ORAL_TABLET | Freq: Four times a day (QID) | ORAL | Status: DC | PRN

## 2014-08-10 NOTE — Telephone Encounter (Signed)
Spoke with Mrs. Lumbert, she spoke to the pharmacist she states and realizes her nausea and vomiting was related to her Seroquel. The oxycodone is not working as well. She would like to resume her hydrocodone. Instructed this will be her last change this month. She verbalizes understanding.

## 2014-08-10 NOTE — Telephone Encounter (Signed)
Pt called, says new medication is not working, pt has come to the realization that her nausea was not caused by hydrocodone but rather her sleep medication (quetiapine). Since the newly prescribed pain medication is not working, she is asking if we could change her pain Rx back to the previous one.  Please call patient

## 2014-08-18 ENCOUNTER — Encounter: Payer: Self-pay | Admitting: Psychology

## 2014-08-18 ENCOUNTER — Ambulatory Visit: Payer: BLUE CROSS/BLUE SHIELD | Attending: Psychology | Admitting: Psychology

## 2014-08-18 DIAGNOSIS — F339 Major depressive disorder, recurrent, unspecified: Secondary | ICD-10-CM | POA: Insufficient documentation

## 2014-08-18 DIAGNOSIS — F4310 Post-traumatic stress disorder, unspecified: Secondary | ICD-10-CM

## 2014-08-18 NOTE — Progress Notes (Signed)
[facility]  [address]   Telephone [phone] [address] Fax [phone] [place], [place] [address]   Psychology Progress Note   Name:  Jessica Horn Date of Birth:  1981/03/14 MRN:  [id number]  Date:08/18/2014 (71m) psychotherapy Jordanne reports having felt more depressed over the past week. She cited a major trigger for her lowered mood was her response to filling out an application to adopt a child. The extensive and probing questions made her doubt her fitness as a parent and sowed seeds of self-doubt in general. She denied thoughts of suicide.   Observations were notable for flatter affect but were otherwise unremarkable.   Reframed her emotional upset to the adoption process as expected fear and self-doubt about embarking on such a major life change rather than reflecting a judgment about her character and self-worth. She has decided to restart taking citalopram after having discontinued citalopram and alprazolam a few weeks back for reasons she could not explain. Told her I support her decision to restart antidepressant medication.  Diagnostic Impressions: Major Depressive Disorder, recurrent with anxious distress [F33.9] Postraumatic Stress Disorder [F43.10]  Return on 08/29/14.   Jamey Ripa, Ph.D Licensed Psychologist

## 2014-08-25 ENCOUNTER — Encounter: Payer: Self-pay | Admitting: Physical Medicine & Rehabilitation

## 2014-08-25 ENCOUNTER — Encounter
Payer: BLUE CROSS/BLUE SHIELD | Attending: Physical Medicine & Rehabilitation | Admitting: Physical Medicine & Rehabilitation

## 2014-08-25 VITALS — BP 116/79 | HR 97 | Resp 14

## 2014-08-25 DIAGNOSIS — D6800 Von Willebrand disease, unspecified: Secondary | ICD-10-CM

## 2014-08-25 DIAGNOSIS — G4700 Insomnia, unspecified: Secondary | ICD-10-CM | POA: Diagnosis present

## 2014-08-25 DIAGNOSIS — F418 Other specified anxiety disorders: Secondary | ICD-10-CM | POA: Diagnosis present

## 2014-08-25 DIAGNOSIS — M546 Pain in thoracic spine: Secondary | ICD-10-CM

## 2014-08-25 DIAGNOSIS — M542 Cervicalgia: Secondary | ICD-10-CM | POA: Insufficient documentation

## 2014-08-25 DIAGNOSIS — M791 Myalgia: Secondary | ICD-10-CM | POA: Diagnosis not present

## 2014-08-25 DIAGNOSIS — M7918 Myalgia, other site: Secondary | ICD-10-CM

## 2014-08-25 DIAGNOSIS — F32A Depression, unspecified: Secondary | ICD-10-CM

## 2014-08-25 DIAGNOSIS — F419 Anxiety disorder, unspecified: Secondary | ICD-10-CM

## 2014-08-25 DIAGNOSIS — F329 Major depressive disorder, single episode, unspecified: Secondary | ICD-10-CM

## 2014-08-25 MED ORDER — HYDROCODONE-ACETAMINOPHEN 5-325 MG PO TABS: 1.0000 | ORAL_TABLET | Freq: Four times a day (QID) | ORAL | Status: DC | PRN

## 2014-08-25 MED ORDER — TRAZODONE HCL 50 MG PO TABS: 50.0000 mg | ORAL_TABLET | Freq: Every day | ORAL | Status: DC

## 2014-08-25 MED ORDER — CITALOPRAM HYDROBROMIDE 20 MG PO TABS: 20.0000 mg | ORAL_TABLET | Freq: Every day | ORAL | Status: DC

## 2014-08-25 NOTE — Progress Notes (Signed)
Subjective:    Patient ID: Jessica Horn, female    DOB: 30-Dec-1980, 34 y.o.   MRN: [id number]  HPI  Jessica Horn is here in follow up of her chronic pain. She continues to see Dr. Valentina Shaggy who is helping her work through her mood/PTSD symptoms. She feel that his visits have been helpful.   She has gone back to the celexa for the last 2 days. She had been off of it 4 months. She had been doing well previous to coming off the medication.  She would also like to work on her sleep.     Pain Inventory Average Pain 10 Pain Right Now 10 My pain is sharp and stabbing  In the last 24 hours, has pain interfered with the following? General activity 8 Relation with others 8 Enjoyment of life 8 What TIME of day is your pain at its worst? all Sleep (in general) Poor  Pain is worse with: walking, bending and standing Pain improves with: therapy/exercise and medication Relief from Meds: 5  Mobility walk with assistance use a cane how many minutes can you walk? 20 ability to climb steps?  yes do you drive?  yes Do you have any goals in this area?  no  Function employed # of hrs/week . 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  no  Neuro/Psych No problems in this area  Prior Studies Any changes since last visit?  no  Physicians involved in your care Any changes since last visit?  no   Family History  Problem Relation Age of Onset  . Hypertension Mother   . Anemia Mother   . Cancer Maternal Aunt     breast  . Kidney disease Maternal Grandmother   . Diabetes Maternal Grandmother   . Breast cancer Maternal Grandmother 78  . Diabetes Paternal Grandfather   . Lupus Other   . Multiple sclerosis Other   . Breast cancer Sister 37  . Breast cancer Paternal Aunt 13  . Diabetes Maternal Grandfather   . Cancer Paternal Grandmother     NOS  . Breast cancer Paternal Aunt 29  . Breast cancer Paternal Aunt   . Ovarian cancer Paternal Aunt    History    Social History  . Marital Status: Married    Spouse Name: N/A  . Number of Children: 1  . Years of Education: N/A   Social History Main Topics  . Smoking status: Never Smoker   . Smokeless tobacco: Not on file  . Alcohol Use: No  . Drug Use: No  . Sexual Activity: Yes   Other Topics Concern  . None   Social History Narrative   Past Surgical History  Procedure Laterality Date  . Tubal ligation    . Cesarean section    . Ovarian cyst removal     Past Medical History  Diagnosis Date  . Fibromyalgia   . Back pain   . Ovarian cyst   . Cancer     breast CA at 34 y/o  . Cancer of spine     spine CA for past 10 years  . Breast cancer   . Depression    BP 116/79 mmHg  Pulse 97  Resp 14  SpO2 98%  Opioid Risk Score:   Fall Risk Score: Moderate Fall Risk (6-13 points)`1  Depression screen PHQ 2/9  Depression screen PHQ 2/9 07/28/2014  Decreased Interest 1  Down, Depressed, Hopeless 0  PHQ - 2 Score  1  Altered sleeping 3  Tired, decreased energy 2  Change in appetite 3  Feeling bad or failure about yourself  0  Trouble concentrating 1  Moving slowly or fidgety/restless 0  Suicidal thoughts 0  PHQ-9 Score 10     Review of Systems  Gastrointestinal: Positive for nausea and vomiting.  All other systems reviewed and are negative.      Objective:   Physical Exam  General: Alert and oriented x 3, Appears generally uncomfortable.  HEENT: Head is normocephalic, atraumatic, PERRLA, EOMI, sclera anicteric, oral mucosa pink and moist, dentition intact, ext ear canals clear,  Neck: Supple without JVD or lymphadenopathy  Heart: Reg rate and rhythm. No murmurs rubs or gallops  Chest: CTA bilaterally without wheezes, rales, or rhonchi; no distress  Abdomen: Soft, non-tender, non-distended, bowel sounds positive.  Extremities: No clubbing, cyanosis, or edema. Pulses are 2+  Skin: Clean and intact without signs of breakdown  Neuro: Pt is cognitively appropriate  with normal insight, memory, and awareness. Occasionally had mild attention issues. Cranial nerves 2-12 are intact. I saw no nystagmus. Sensory exam is normal for PP and LT although proprioception was decreased in both legs, feet seemed to be somewhat intact. She had diminished Cedar of both arms and legs and had difficulty with heel to shin, finger to nose and rapid alternating movements. . She has mild intentional tremor in both hands.. She is using a cane still for balance.  Musculoskeletal: posture was fair. . Tender points/areas present. Having axial pain.  Psych: affect is flat.     Assessment & Plan:   1. Chronic cervico-thoracic axial spine pain. Multifactorial 2. 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  3. Major depression with anxiety (PTSD?)  4. Insomnia--persistent  5. Hx of breast cancer rx'ed in 2004? With CTX and XRT  6. Hx of Von Willebrand's disease   Plan:  1. Continue robaxin for muscle spasm  2. Continue with counseling with Dr. Valentina Shaggy for coping and anxiety mgt.  3. maintain xanax to 0.5mg  to 1mg  q12 prn. Will refill/resume celexa at 20mg  qhs  4. Initiate trazodone for sleep 50mg  qhs 5. Refilled hydrocodone today with a second rx for next month.  7. Challenged her to find some volunteer work and to increase exercise at home as we adjust meds..  8. Follow up with me or NP in two months. 30 min of face to face patient care time was spent during this visit. All questions were encouraged and answered.   I WROTE HYDROCODONE SCRIPT FOR TODAY BECAUSE SHE'S GOING OUT OF TOWN FOR THREE WEEKS. SHE WILL NOT BE DUE FOR HER NEXT FILL OF HYDROCODONE UNTIL APPROXIMATELY 10/06/14

## 2014-08-25 NOTE — Patient Instructions (Signed)
WORK ON INCREASING YOUR EXERCISE  CONSIDER AN HOUR OR TWO OF VOLUNTEER WORK EACH WEEK  DON'T LET YOUR THOUGHTS AND SENSATION OF PAIN CONSUME YOU!!!!!

## 2014-08-29 ENCOUNTER — Ambulatory Visit: Payer: BLUE CROSS/BLUE SHIELD | Admitting: Psychology

## 2014-09-07 ENCOUNTER — Telehealth: Payer: Self-pay | Admitting: *Deleted

## 2014-09-07 DIAGNOSIS — D6800 Von Willebrand disease, unspecified: Secondary | ICD-10-CM

## 2014-09-07 DIAGNOSIS — M546 Pain in thoracic spine: Secondary | ICD-10-CM

## 2014-09-07 DIAGNOSIS — G4700 Insomnia, unspecified: Secondary | ICD-10-CM

## 2014-09-07 DIAGNOSIS — F418 Other specified anxiety disorders: Secondary | ICD-10-CM

## 2014-09-07 NOTE — Telephone Encounter (Signed)
Please make referral. (did you make her aware that we offer acupuncture here?---although i am not sure how much of a "solution" acupuncture will offer for her problem.

## 2014-09-07 NOTE — Telephone Encounter (Signed)
Jessica Horn called and is asking if we can make a referral for her to go to [facility] for her pain management.  She is not transferring because of unhappiness here but because her insurance will cover acupuncture and other things they offer and it will be easiest to get it managed all in one place.  She has no PCP so asking if Dr Naaman Plummer will make the referral. (let him know it is not anything to do with him but forconvenience)

## 2014-09-07 NOTE — Telephone Encounter (Signed)
Referral made 

## 2014-09-08 ENCOUNTER — Telehealth: Payer: Self-pay | Admitting: *Deleted

## 2014-09-08 NOTE — Telephone Encounter (Signed)
Richele called to confirm if we had made the referral to [facility].  I confirmed with her that we had placed the referral, but I could not tell her when it would be processed by them.

## 2014-09-18 ENCOUNTER — Encounter: Payer: Self-pay | Admitting: Registered Nurse

## 2014-09-18 ENCOUNTER — Encounter: Payer: BLUE CROSS/BLUE SHIELD | Attending: Physical Medicine & Rehabilitation | Admitting: Registered Nurse

## 2014-09-18 VITALS — BP 106/70 | HR 80 | Resp 14

## 2014-09-18 DIAGNOSIS — G4700 Insomnia, unspecified: Secondary | ICD-10-CM | POA: Diagnosis present

## 2014-09-18 DIAGNOSIS — M542 Cervicalgia: Secondary | ICD-10-CM | POA: Diagnosis present

## 2014-09-18 DIAGNOSIS — M546 Pain in thoracic spine: Secondary | ICD-10-CM

## 2014-09-18 DIAGNOSIS — G894 Chronic pain syndrome: Secondary | ICD-10-CM

## 2014-09-18 DIAGNOSIS — M7918 Myalgia, other site: Secondary | ICD-10-CM

## 2014-09-18 DIAGNOSIS — M791 Myalgia: Secondary | ICD-10-CM

## 2014-09-18 DIAGNOSIS — F418 Other specified anxiety disorders: Secondary | ICD-10-CM

## 2014-09-18 DIAGNOSIS — Z5181 Encounter for therapeutic drug level monitoring: Secondary | ICD-10-CM

## 2014-09-18 DIAGNOSIS — Z79899 Other long term (current) drug therapy: Secondary | ICD-10-CM

## 2014-09-18 MED ORDER — HYDROCODONE-ACETAMINOPHEN 5-325 MG PO TABS: 1.0000 | ORAL_TABLET | Freq: Four times a day (QID) | ORAL | Status: DC | PRN

## 2014-09-18 NOTE — Progress Notes (Signed)
Subjective:    Patient ID: Jessica Horn, female    DOB: 1980-07-12, 34 y.o.   MRN: [id number]  HPI: Mrs. Amayia Ciano is a 34 year old female who returns for follow up for chronic pain and medication refill. She says her pain is located in her lower back. She rates her pain 8. Her current exercise regime is walking around her neighborhood daily, she has noticed a decrease in her pain with the increase activity. Encouraged to continue activity. Mrs. Shroff will be picking up a newborn today with the possibility of adoption, she's very excited. Educated on keeping her narcotic's in a lock box she verbalizes understanding.  Pain Inventory Average Pain 8 Pain Right Now 8 My pain is sharp and stabbing  In the last 24 hours, has pain interfered with the following? General activity 7 Relation with others 7 Enjoyment of life 7 What TIME of day is your pain at its worst? all Sleep (in general) Poor  Pain is worse with: unsure Pain improves with: medication Relief from Meds: 5  Mobility walk with assistance use a cane ability to climb steps?  yes do you drive?  yes Do you have any goals in this area?  no  Function not employed: date last employed . Do you have any goals in this area?  no  Neuro/Psych No problems in this area  Prior Studies Any changes since last visit?  no  Physicians involved in your care Any changes since last visit?  no   Family History  Problem Relation Age of Onset  . Hypertension Mother   . Anemia Mother   . Cancer Maternal Aunt     breast  . Kidney disease Maternal Grandmother   . Diabetes Maternal Grandmother   . Breast cancer Maternal Grandmother 78  . Diabetes Paternal Grandfather   . Lupus Other   . Multiple sclerosis Other   . Breast cancer Sister 34  . Breast cancer Paternal Aunt 79  . Diabetes Maternal Grandfather   . Cancer Paternal Grandmother     NOS  . Breast cancer Paternal Aunt 42  . Breast cancer Paternal  Aunt   . Ovarian cancer Paternal Aunt    History   Social History  . Marital Status: Married    Spouse Name: N/A  . Number of Children: 1  . Years of Education: N/A   Social History Main Topics  . Smoking status: Never Smoker   . Smokeless tobacco: Not on file  . Alcohol Use: No  . Drug Use: No  . Sexual Activity: Yes   Other Topics Concern  . None   Social History Narrative   Past Surgical History  Procedure Laterality Date  . Tubal ligation    . Cesarean section    . Ovarian cyst removal     Past Medical History  Diagnosis Date  . Fibromyalgia   . Back pain   . Ovarian cyst   . Cancer     breast CA at 33 y/o  . Cancer of spine     spine CA for past 10 years  . Breast cancer   . Depression    BP 106/70 mmHg  Pulse 80  Resp 14  SpO2 98%  Opioid Risk Score:   Fall Risk Score: Moderate Fall Risk (6-13 points)`1  Depression screen PHQ 2/9  Depression screen PHQ 2/9 07/28/2014  Decreased Interest 1  Down, Depressed, Hopeless 0  PHQ - 2 Score 1  Altered sleeping 3  Tired,  decreased energy 2  Change in appetite 3  Feeling bad or failure about yourself  0  Trouble concentrating 1  Moving slowly or fidgety/restless 0  Suicidal thoughts 0  PHQ-9 Score 10     Review of Systems  All other systems reviewed and are negative.      Objective:   Physical Exam  Constitutional: She is oriented to person, place, and time. She appears well-developed and well-nourished.  HENT:  Head: Normocephalic and atraumatic.  Neck: Normal range of motion. Neck supple.  Cardiovascular: Normal rate and regular rhythm.   Pulmonary/Chest: Effort normal and breath sounds normal.  Musculoskeletal:  Normal Muscle Bulk and Muscle Testing Reveals: Upper Extremities: Full ROM and Muscle Strength 5/5 Lumbar Paraspinal Tenderness: L-3- L-5 Lower Extremities: Full ROM and Muscle Strength 5/5 Arises from chair with ease/ using straight cane for support Narrow Based gait    Neurological: She is alert and oriented to person, place, and time.  Skin: Skin is warm and dry.  Psychiatric: She has a normal mood and affect.  Nursing note and vitals reviewed.         Assessment & Plan:  1. Chronic cervico-thoracic axial spine pain./Mypfascial Pain: Refilled: Hydrocodone 5/325 mg one tablet every 6 hours as needed #120 2. 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  3. Depression with anxiety: Continue Celexa and Xanax. Dr. Valentina Shaggy Following 4. Insomnia: Continue Trazodone. Continue to Monitor. 5. Hx of breast cancer rx'ed in 2004? With CTX and XRT : GYN Following 6. Hx of Von Willebrand's disease : GYN Following 7. Ovarian Mass: S/P Hysterectomy on 04/18/2014 8. Muscle Spasm: Continue Robaxin  20 min of face to face patient care time was spent during this visit. All questions were encouraged and answered.   F/U in 1 month

## 2014-09-20 ENCOUNTER — Telehealth: Payer: Self-pay | Admitting: *Deleted

## 2014-09-20 NOTE — Telephone Encounter (Signed)
Pt called saying [facility] has not received records from [place] yet. Says she was told that they would be sent. She also asking for a referral  to be sent as well. I have already spoke to you about this in person, just posting the message on an official capacity

## 2014-09-20 NOTE — Telephone Encounter (Signed)
I spoke with Dr. Naaman Plummer regarding Jessica Horn request for Acupuncture. She will be scheduled for procedure with Dr. Letta Pate in June. Office staff will call her with an appointment date and time. She verbalizes understanding.  She still would like for [place] to send a referral to [facility] office staff will handle this. She verbalizes understanding.

## 2014-09-25 ENCOUNTER — Telehealth: Payer: Self-pay | Admitting: *Deleted

## 2014-09-25 NOTE — Telephone Encounter (Signed)
Embree is calling to find out why her referral to [facility] has not been made.  She is requesting a call back TODAY to explain this.  She was asking for me and demanding an explanation.  I called Estreya back and informed her that the referral was placed the day she requested it 09/07/14. It is in EPIC.  She was asking for me and an explanation and who I talked with.  I informed her that I place the order for the referral but she would need to speak with the front office person Ovidio Kin for specifics.She was transferred to speak with Corene Cornea.

## 2014-10-20 ENCOUNTER — Telehealth: Payer: Self-pay | Admitting: Registered Nurse

## 2014-10-20 ENCOUNTER — Telehealth: Payer: Self-pay | Admitting: *Deleted

## 2014-10-20 MED ORDER — TIZANIDINE HCL 2 MG PO TABS: ORAL_TABLET | ORAL | Status: DC

## 2014-10-20 NOTE — Telephone Encounter (Signed)
Jessica Horn called the office at 4:00: I returned her call she states " she was told by [facility] two weeks ago she was accepted by their practice. Also stated the nurse from [facility]  told her to discard her medications, also states she flushed them down the toilet. I explained to her this is not how medications are to be discarded and she should have called our office. Also she was seen by [facility] today. She states they told her to call our office to obtain her medications. I called [facility] they were closed.  I checked the [facility] they have her last prescription was dispensed on 08/25/2014. I seen Jessica Horn on 09/18/2014 she was given a prescription for hydrocodone with the date to be filled on 10/03/2014. I placed a call to [facility] last script filled 08/25/2014. I called back to Jessica Horn she states [facility] on 10/03/2014. I  called Jessica Horn to inform her I will not prescribe her medication at this time. Will speak with Dr. Naaman Plummer on Monday. She's having muscle spasm and asked if I could order a muscle relaxer. I will discontinued Robaxin and order a bridge of  Tizanidine 2 mg one tablet every 8 hours as needed for muscle spasm.  Until I speak to [facility] on Monday. She verbalizes understanding.

## 2014-10-20 NOTE — Telephone Encounter (Signed)
Patient called asking to speak to Danella Sensing. I told her the provider was w/ pt's. Please call pt back.  No specifics given

## 2014-10-23 ENCOUNTER — Telehealth: Payer: Self-pay | Admitting: *Deleted

## 2014-10-23 ENCOUNTER — Telehealth: Payer: Self-pay | Admitting: Registered Nurse

## 2014-10-23 MED ORDER — TIZANIDINE HCL 2 MG PO TABS: ORAL_TABLET | ORAL | Status: DC

## 2014-10-23 NOTE — Telephone Encounter (Signed)
I returned Jessica Horn call, She was informed we are awaiting a call back from [name] why she was instructed to destroy her medications according to Jessica Horn. She verbalizes understanding. We will order her tizanidine. She verbalizes understanding.

## 2014-10-23 NOTE — Telephone Encounter (Signed)
Per Danella Sensing, NP, I reached out to [facility] to confirm the validity of pt's claim that she was told to personally discard her medications (pt claims she flushed them down the toilet). I received a call back from a front desk person and asked basic questions regarding clinic policy regarding the destruction of medications.  She said it was HIGHLY unlikely that any staffer would suggest a patient destroy their own medications. They follow very similar procedures with counting, recording, and destruction of medication in plain sight witnessed by patient and 3rd party observer. I asked if she could send a message to the provider to please call Danella Sensing, NP to discuss the situation.

## 2014-10-24 ENCOUNTER — Telehealth: Payer: Self-pay | Admitting: Registered Nurse

## 2014-10-24 DIAGNOSIS — M7918 Myalgia, other site: Secondary | ICD-10-CM

## 2014-10-24 DIAGNOSIS — M546 Pain in thoracic spine: Secondary | ICD-10-CM

## 2014-10-24 MED ORDER — HYDROCODONE-ACETAMINOPHEN 5-325 MG PO TABS: 1.0000 | ORAL_TABLET | Freq: Four times a day (QID) | ORAL | Status: DC | PRN

## 2014-10-24 NOTE — Telephone Encounter (Signed)
I spoke with Dr. Wess Botts this morning from [facility]. He states yes they instruct patient's to destroy there medications. Upon review of Mrs. Shari Prows case they will not prescribe her narcotics due to her ETOH history and possible drug diversion.  Dr. Wess Botts number [phone]  I spoke with Dr. Naaman Plummer regarding the above, Mrs. Fedele can choose which Pain Management practice she would like to to be evaluated.  Spoke with Mrs. Slovacek she would like to continue being a patient at [facility]. Hydrocodone script will be printed today.  She verbalizes understanding.

## 2014-10-26 ENCOUNTER — Other Ambulatory Visit: Payer: Self-pay | Admitting: Student

## 2014-10-26 DIAGNOSIS — Z79899 Other long term (current) drug therapy: Secondary | ICD-10-CM

## 2014-10-26 DIAGNOSIS — G894 Chronic pain syndrome: Secondary | ICD-10-CM

## 2014-10-30 ENCOUNTER — Ambulatory Visit: Payer: BLUE CROSS/BLUE SHIELD | Admitting: Registered Nurse

## 2014-10-30 ENCOUNTER — Other Ambulatory Visit: Payer: BLUE CROSS/BLUE SHIELD

## 2014-11-02 ENCOUNTER — Ambulatory Visit: Payer: BLUE CROSS/BLUE SHIELD | Admitting: Physical Medicine & Rehabilitation

## 2014-11-15 ENCOUNTER — Telehealth: Payer: Self-pay | Admitting: Physical Medicine & Rehabilitation

## 2014-11-15 NOTE — Telephone Encounter (Signed)
Called pt to let her know that we currently did not have any openings for appointments. She stated she would run out of meds on 11-23-14 and was leaving to go out of town on 7-13. I told her that if a cancellation occurred on either Jessica Horn or Jessica Horn's schedule we would call her. I also told her to call back on Tuesday Morning, UNHR14, if she had not heard from [place] before then.

## 2014-11-20 ENCOUNTER — Encounter: Payer: Self-pay | Admitting: Physical Medicine & Rehabilitation

## 2014-11-20 ENCOUNTER — Encounter
Payer: BLUE CROSS/BLUE SHIELD | Attending: Physical Medicine & Rehabilitation | Admitting: Physical Medicine & Rehabilitation

## 2014-11-20 VITALS — BP 132/78 | HR 130

## 2014-11-20 DIAGNOSIS — F418 Other specified anxiety disorders: Secondary | ICD-10-CM | POA: Insufficient documentation

## 2014-11-20 DIAGNOSIS — M791 Myalgia: Secondary | ICD-10-CM | POA: Diagnosis not present

## 2014-11-20 DIAGNOSIS — F329 Major depressive disorder, single episode, unspecified: Secondary | ICD-10-CM

## 2014-11-20 DIAGNOSIS — M546 Pain in thoracic spine: Secondary | ICD-10-CM | POA: Insufficient documentation

## 2014-11-20 DIAGNOSIS — F419 Anxiety disorder, unspecified: Secondary | ICD-10-CM

## 2014-11-20 DIAGNOSIS — G4700 Insomnia, unspecified: Secondary | ICD-10-CM | POA: Diagnosis present

## 2014-11-20 DIAGNOSIS — M542 Cervicalgia: Secondary | ICD-10-CM | POA: Insufficient documentation

## 2014-11-20 DIAGNOSIS — M7918 Myalgia, other site: Secondary | ICD-10-CM

## 2014-11-20 DIAGNOSIS — F32A Depression, unspecified: Secondary | ICD-10-CM

## 2014-11-20 MED ORDER — HYDROCODONE-ACETAMINOPHEN 5-325 MG PO TABS: 1.0000 | ORAL_TABLET | Freq: Four times a day (QID) | ORAL | Status: DC | PRN

## 2014-11-20 MED ORDER — TIZANIDINE HCL 2 MG PO TABS: ORAL_TABLET | ORAL | Status: DC

## 2014-11-20 NOTE — Progress Notes (Signed)
Subjective:    Patient ID: Jessica Horn, female    DOB: 1980/09/24, 34 y.o.   MRN: [id number]  HPI   Christmas is here in follow up of her chronic pain. She was looking at going to another pain center which offered acupuncture as part of its holistic approach. She told me that she didn't like the way that they approached her and that the practitioner was looking at ordering a thoracic MRI without even seeing her. They also said that her medications and history were such that they didn't want to take her on.   Avelina states that she's frustrated that there hasn't been an answer for her upper back pain. I reminded her that we did do MRI's of her cerivcla and thoracic spine in 2024 which were largely unremarkable. She has gone through therapy and medications as well as counseling.   She tried tizanidine thru my NP which seems to help between doses of her pain medication.   In talking about her thoracic back pain, it is worse during the day when she's active. It's worst when she bends over or standing for longer periods of time. It's the best at night when she's lying down. Her sleep is actually pretty good.   Pain Inventory Average Pain 8 Pain Right Now 8 My pain is constant, sharp and stabbing  In the last 24 hours, has pain interfered with the following? General activity 8 Relation with others 8 Enjoyment of life 8 What TIME of day is your pain at its worst? ALL Sleep (in general) Poor  Pain is worse with: unsure Pain improves with: heat/ice and medication Relief from Meds: 3  Mobility use a cane how many minutes can you walk? 20 ability to climb steps?  yes do you drive?  yes  Function disabled: date disabled .  Neuro/Psych dizziness depression anxiety  Prior Studies Any changes since last visit?  no  Physicians involved in your care Any changes since last visit?  no   Family History  Problem Relation Age of Onset  . Hypertension Mother   . Anemia Mother    . Cancer Maternal Aunt     breast  . Kidney disease Maternal Grandmother   . Diabetes Maternal Grandmother   . Breast cancer Maternal Grandmother 78  . Diabetes Paternal Grandfather   . Lupus Other   . Multiple sclerosis Other   . Breast cancer Sister 74  . Breast cancer Paternal Aunt 35  . Diabetes Maternal Grandfather   . Cancer Paternal Grandmother     NOS  . Breast cancer Paternal Aunt 49  . Breast cancer Paternal Aunt   . Ovarian cancer Paternal Aunt    History   Social History  . Marital Status: Married    Spouse Name: N/A  . Number of Children: 1  . Years of Education: N/A   Social History Main Topics  . Smoking status: Never Smoker   . Smokeless tobacco: Not on file  . Alcohol Use: No  . Drug Use: No  . Sexual Activity: Yes   Other Topics Concern  . None   Social History Narrative   Past Surgical History  Procedure Laterality Date  . Tubal ligation    . Cesarean section    . Ovarian cyst removal     Past Medical History  Diagnosis Date  . Fibromyalgia   . Back pain   . Ovarian cyst   . Cancer     breast CA at 34 y/o  .  Cancer of spine     spine CA for past 10 years  . Breast cancer   . Depression    BP 132/78 mmHg  Pulse 130  SpO2 99%  Opioid Risk Score:   Fall Risk Score: Moderate Fall Risk (6-13 points)`1  Depression screen PHQ 2/9  Depression screen PHQ 2/9 07/28/2014  Decreased Interest 1  Down, Depressed, Hopeless 0  PHQ - 2 Score 1  Altered sleeping 3  Tired, decreased energy 2  Change in appetite 3  Feeling bad or failure about yourself  0  Trouble concentrating 1  Moving slowly or fidgety/restless 0  Suicidal thoughts 0  PHQ-9 Score 10     Review of Systems  Constitutional: Negative.   HENT: Negative.   Eyes: Negative.   Respiratory: Negative.   Cardiovascular: Negative.   Gastrointestinal: Negative.   Endocrine: Negative.   Genitourinary: Negative.   Musculoskeletal: Positive for back pain and arthralgias.    Skin: Negative.   Allergic/Immunologic: Negative.   Neurological: Positive for dizziness.  Hematological: Negative.   Psychiatric/Behavioral: Positive for dysphoric mood. The patient is nervous/anxious.        Objective:   Physical Exam  General: Alert and oriented x 3, Appears generally uncomfortable.  HEENT: Head is normocephalic, atraumatic, PERRLA, EOMI, sclera anicteric, oral mucosa pink and moist, dentition intact, ext ear canals clear,  Neck: Supple without JVD or lymphadenopathy  Heart: Reg rate and rhythm. No murmurs rubs or gallops  Chest: CTA bilaterally without wheezes, rales, or rhonchi; no distress  Abdomen: Soft, non-tender, non-distended, bowel sounds positive.  Extremities: No clubbing, cyanosis, or edema. Pulses are 2+  Skin: Clean and intact without signs of breakdown  Neuro: Pt is cognitively appropriate with normal insight, memory, and awareness. Occasionally had mild attention issues. Cranial nerves 2-12 are intact. I saw no nystagmus. Sensory exam is normal for PP and LT. ? Decreased proprioception in both legs, feet seemed to be somewhat intact. She had diminished [place] of both arms and legs and had difficulty with heel to shin, finger to nose and rapid alternating movements. .   She is using a cane still for balance which appeared good today. DTR's are all 1+ to 2+. CN exam non-focal.  Musculoskeletal:  Head forward position which originates in the upper thoracic levels. She is able to correct herself when cued but stayed in that position until I cued her. There is tenderness along the thoracic paraspinals on the right as well as the rhomboids. pecs are tight bilaterally. She has minimal tenderness with RTC and shoulder maneuvers. She has normal rotation, flexion, bending of the lumbar spine on exam. . Tender points/areas present throughout the neck/shoulder girdle. No focal trigger points were noted today.  Psych: affect is flat. Appears depressed/ nervous at  times Assessment & Plan:   1. Chronic cervico-thoracic axial spine pain. Multifactorial---largely myofascial in my opinion 2. 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 Non organic components? 3. Major depression with anxiety (PTSD?)  4. Insomnia- some improvement?  5. Hx of breast cancer rx'ed in 2004? With CTX and XRT  6. Hx of Von Willebrand's disease     Plan:  1. Tizanidine for muscle spasm 2mg  q8 prn---observe for asociated fatigue or dizziness 2. Continue with counseling with Dr. Valentina Shaggy for coping and anxiety mgt. i stressed to her the importance of this 3. maintain xanax to 0.5mg  to 1mg  q12 prn.   celexa at 20mg  qhs  4. Explained to her that an MRI  of her thoracic spine was not indicated at this point given her exam and the fact that the prior exam from 2014 was largely unremarkable. Could consider a follow up xray at some point 5. Refilled hydrocodone today with a second rx for next month.  7. Discussed importance of her posture and the stress her posture puts on her shoulder girdle and paraspinal musculature. Her posture has been poor for some time and she admits that. We reviewed the idea of a head back, shoulders back posture/position. Provided pilates based principles as it pertains to her head forward position. In particular we discussed pec stretches, scapular mobilization, and head position today.  8. Follow up with me or NP in two months. 30 min of face to face patient care time was spent during this visit. All questions were encouraged and answered.

## 2014-11-20 NOTE — Patient Instructions (Signed)
WORK ON YOUR POSTURE AND RANGE OF MOTION AT HOME EVERY DAY.   CONSIDER A BACK PACK WITH 5LBS OF WEIGHT INSIDE TO REMIND YOU ABOUT POSTURE.

## 2014-11-24 ENCOUNTER — Telehealth: Payer: Self-pay | Admitting: *Deleted

## 2014-11-24 NOTE — Telephone Encounter (Signed)
Patient is having bowel control issues and believes it is because of the new muscle relaxer.  Wants to go back to Methocarbamol.  Please call her to let her know if this is OK

## 2014-11-27 NOTE — Telephone Encounter (Signed)
Is this ok?

## 2014-11-27 NOTE — Telephone Encounter (Signed)
That is fine 

## 2014-11-28 MED ORDER — METHOCARBAMOL 500 MG PO TABS: 500.0000 mg | ORAL_TABLET | Freq: Three times a day (TID) | ORAL | Status: DC

## 2014-11-28 NOTE — Telephone Encounter (Signed)
Called patient to let her know that it's OK with the doctor to go back on Methocarbamol - will call into [facility] on [address]

## 2014-12-05 ENCOUNTER — Ambulatory Visit: Payer: BLUE CROSS/BLUE SHIELD | Admitting: Physical Medicine & Rehabilitation

## 2014-12-05 ENCOUNTER — Ambulatory Visit: Payer: BLUE CROSS/BLUE SHIELD

## 2014-12-05 IMAGING — CR DG THORACIC SPINE 2V
3 series · 3 of 3 positions shown · non-contrast
Comparison: None

CLINICAL DATA: Upper and lower back pain, fell 4 days ago

THORACIC SPINE - 2 VIEW

[t t-spine a.p.]
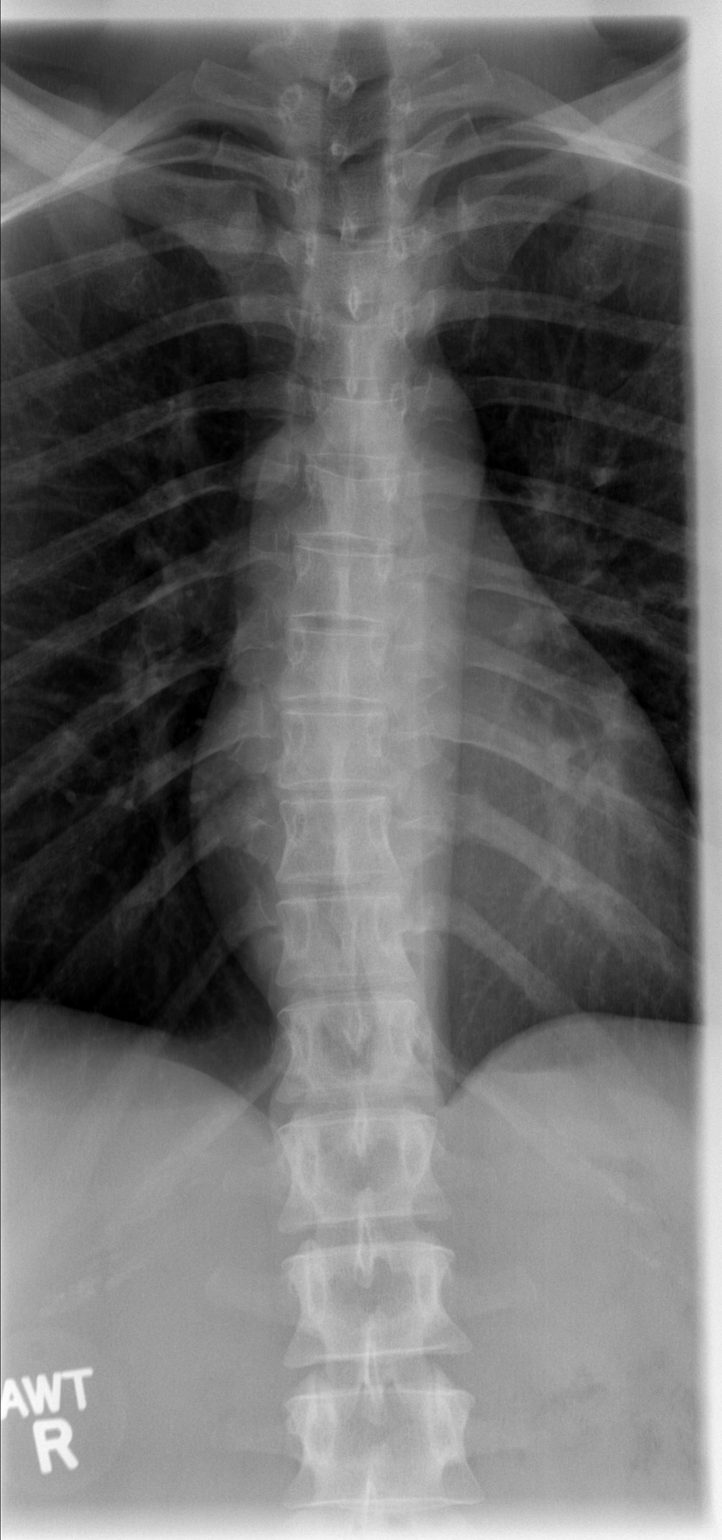

[t t-spine lat]
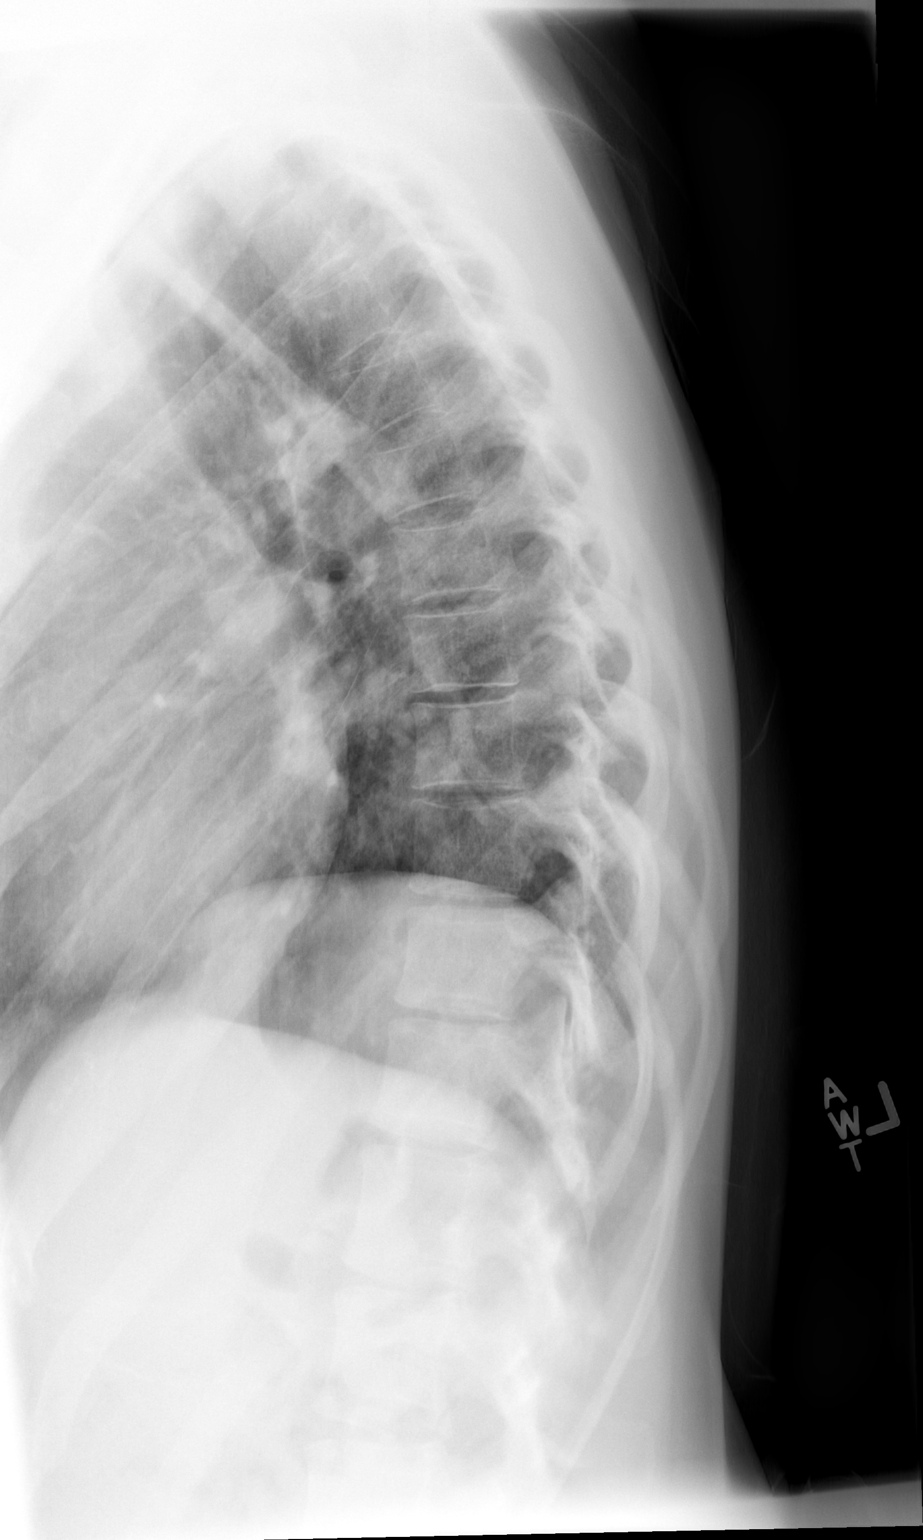

[t swimmers]
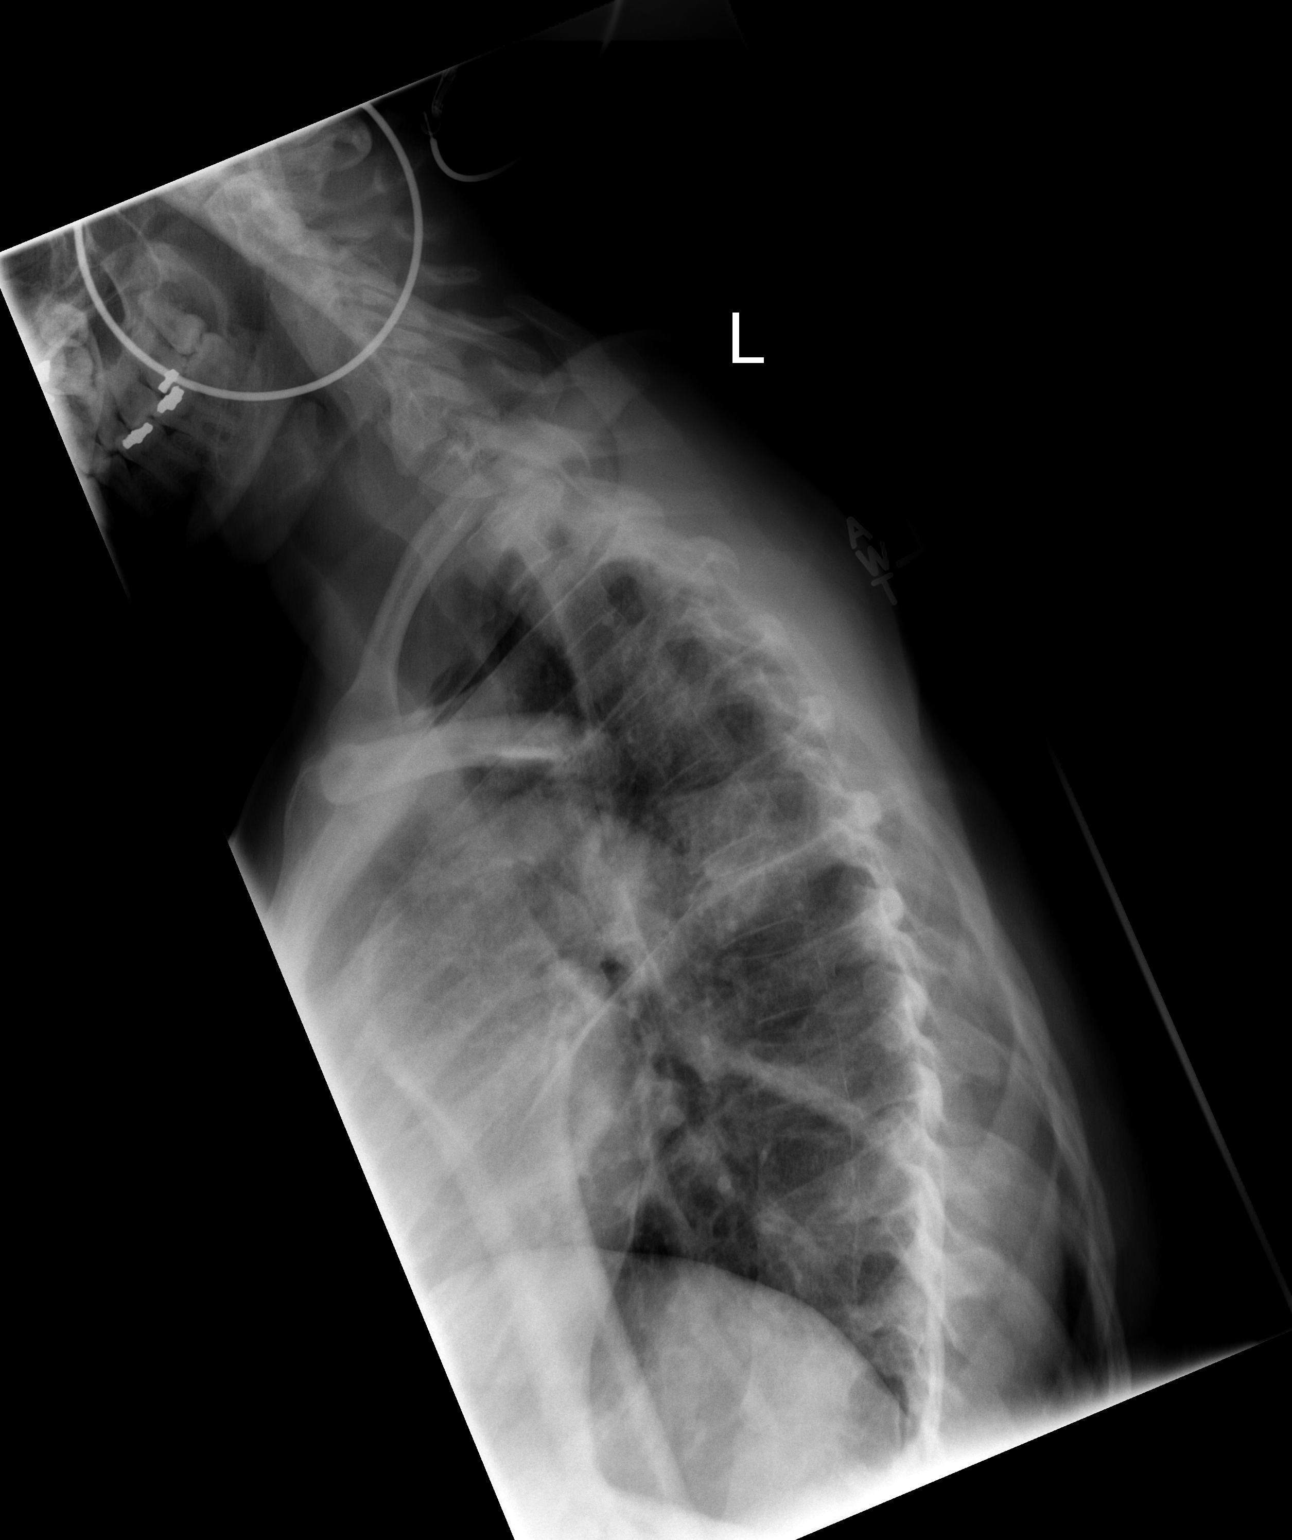

[3 of 3 positions shown; findings below may reference images not displayed]

FINDINGS: 12 pairs of ribs.
Osseous mineralization grossly normal.
Broad-based dextroconvex thoracic scoliosis apex T9.
Vertebral body and disc space heights maintained.
No acute fracture, subluxation or bone destruction.
Visualized portions of the posterior ribs appear intact.
IMPRESSION: Minimal broad-based dextroconvex thoracic scoliosis.
Otherwise negative exam.

## 2014-12-05 IMAGING — CR DG LUMBAR SPINE COMPLETE W/ BEND
7 series · 7 of 7 positions shown · non-contrast
Comparison: [DATE]

CLINICAL DATA: Upper and lower back pain, fell 4 days ago

LUMBAR SPINE - COMPLETE WITH BENDING VIEWS

[t l-spine a.p.]
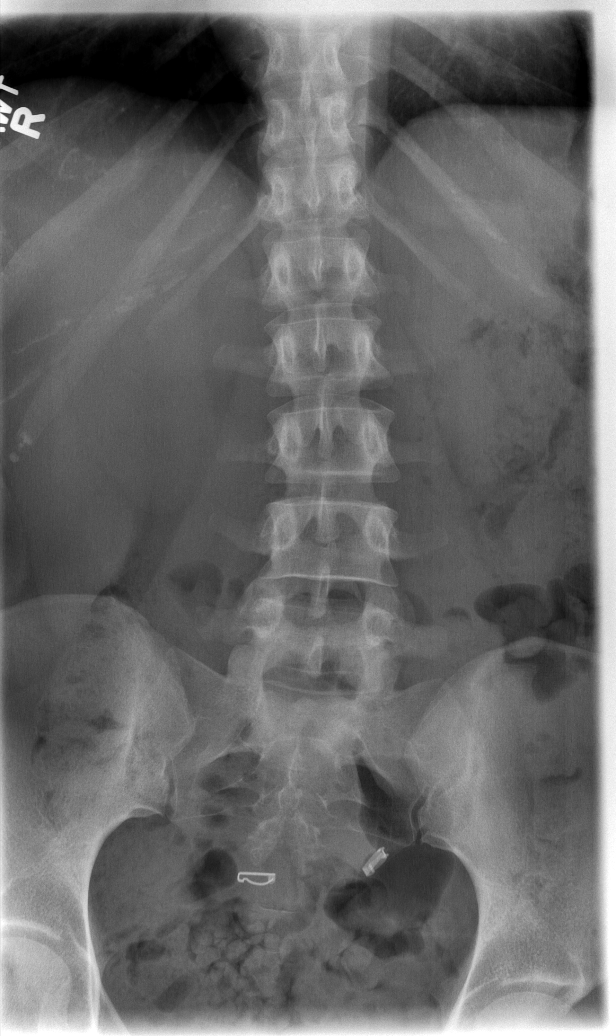

[t l-spine oblique exposure (1 of 2)]
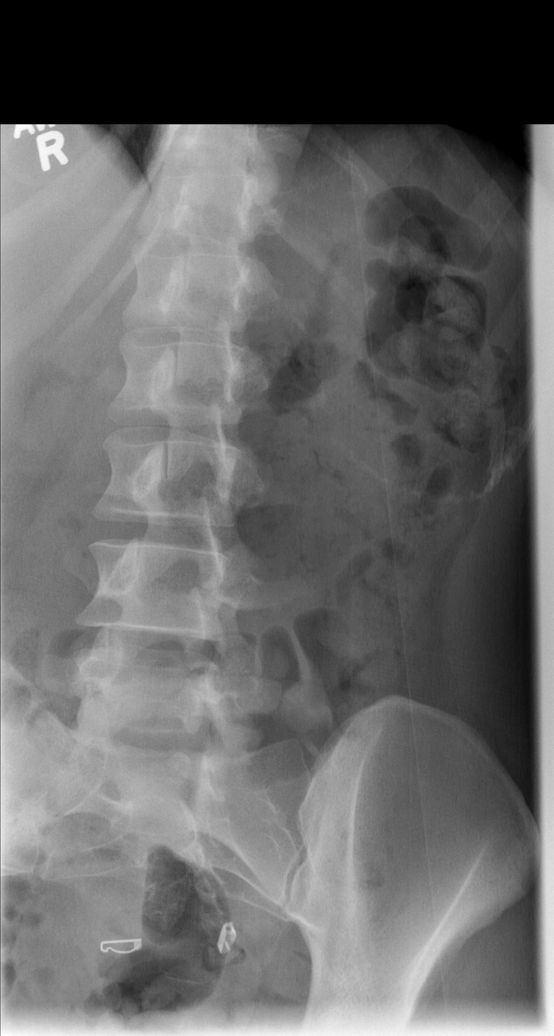

[t l-spine oblique exposure (2 of 2)]
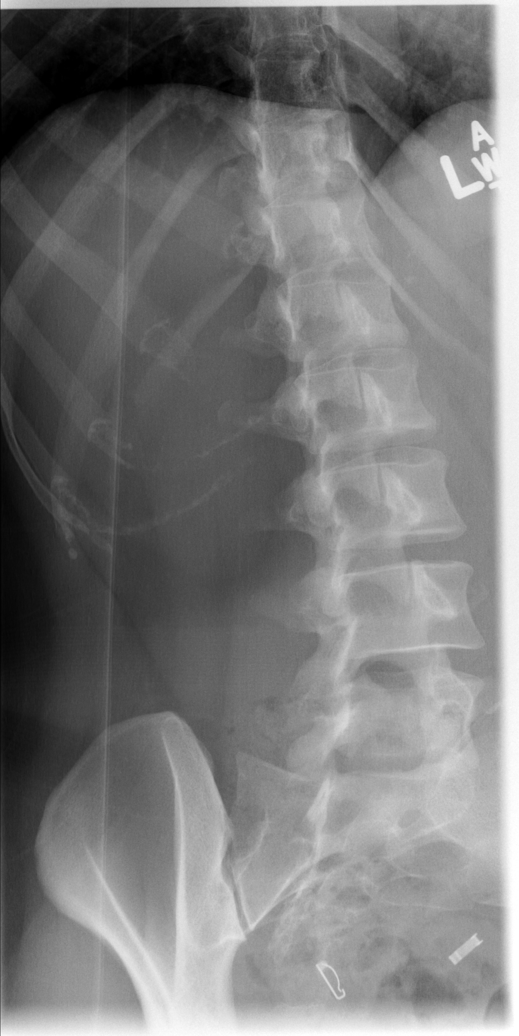

[t l-spine lat]
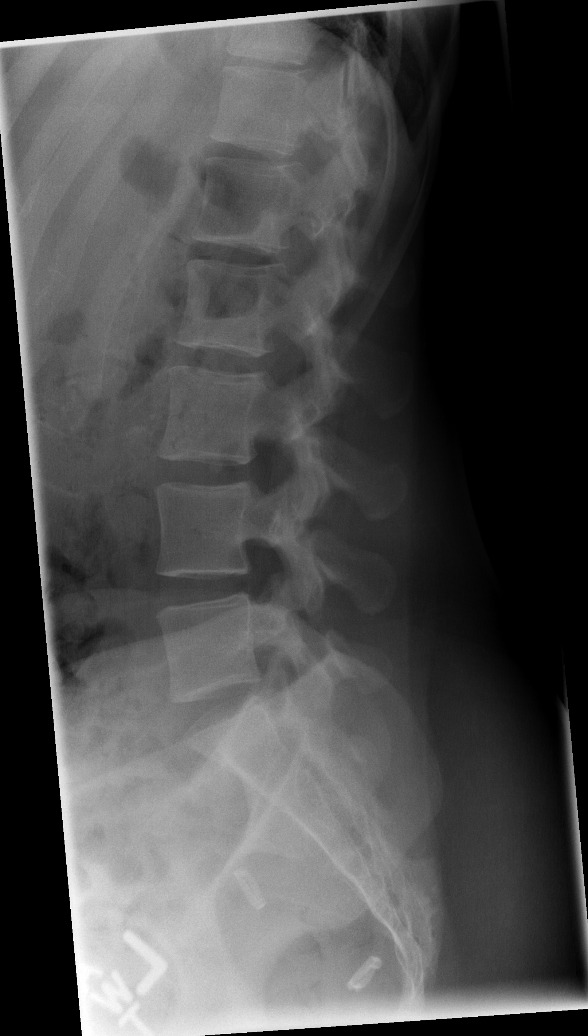

[t l-spine l5-s1 spot]
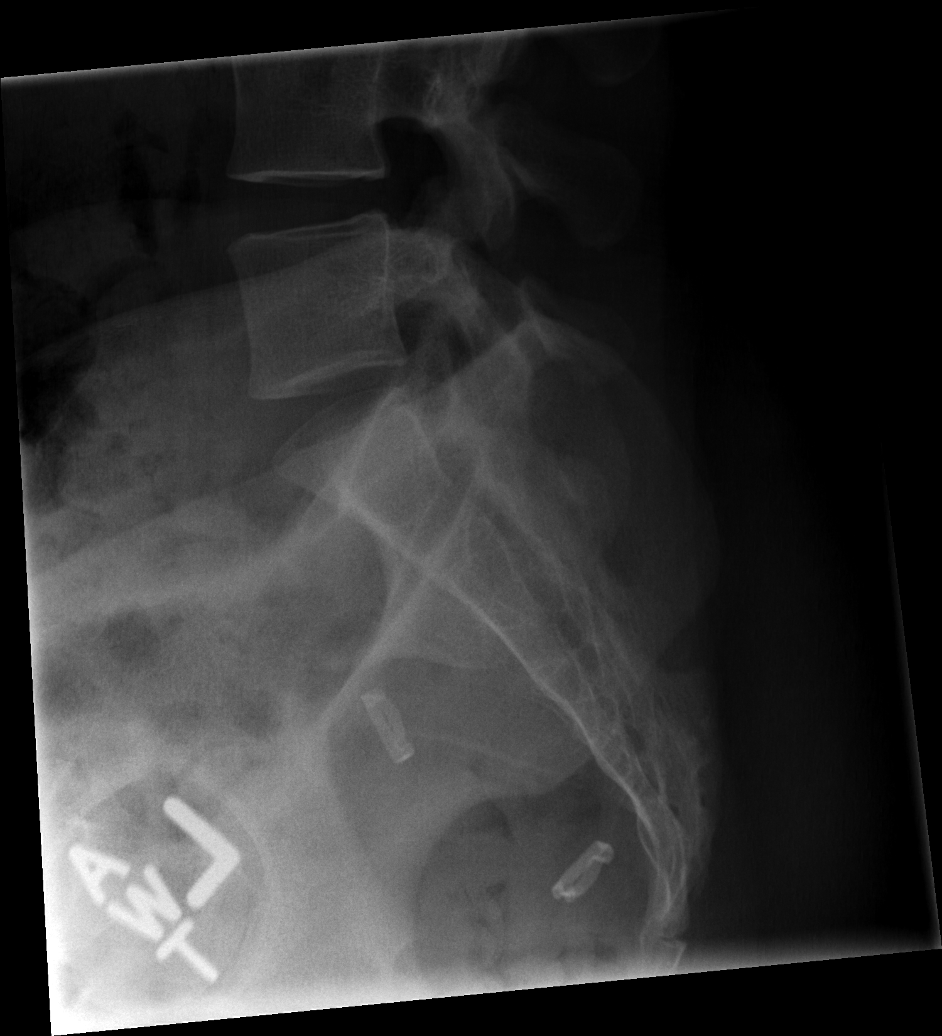

[w l-spine flexion/extension *]
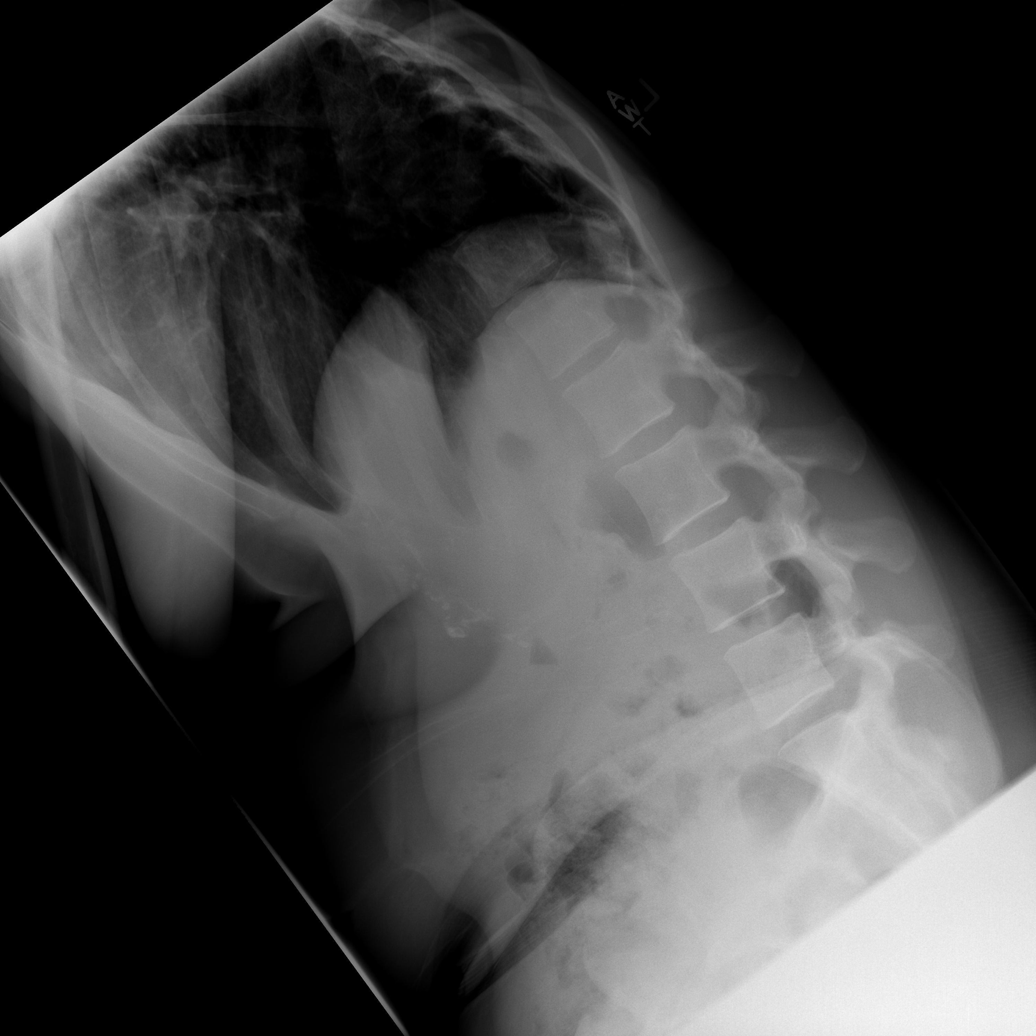

[w l-spine flexion/extension]
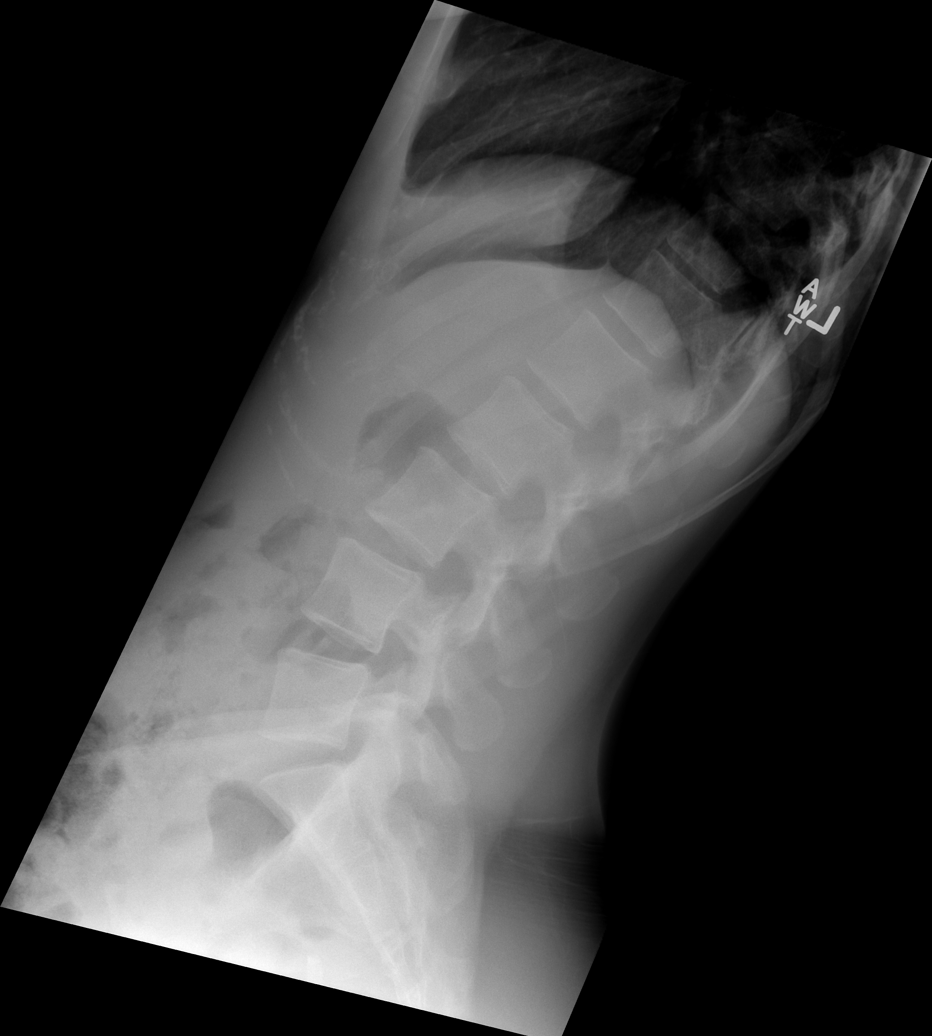

[7 of 7 positions shown; findings below may reference images not displayed]

FINDINGS: Mild broad-based levoconvex lumbar scoliosis apex L3.
Five non-rib bearing lumbar vertebrae.
Osseous mineralization normal.
Vertebral body and disc space heights maintained.
No acute fracture, subluxation, or bone destruction.
No spondylolysis.
No abnormal motion identified with flexion or extension.
IMPRESSION: Levoconvex lumbar scoliosis.
Otherwise negative exam.

## 2014-12-08 ENCOUNTER — Emergency Department (HOSPITAL_COMMUNITY): Payer: BLUE CROSS/BLUE SHIELD

## 2014-12-08 ENCOUNTER — Emergency Department (HOSPITAL_COMMUNITY)
Admission: EM | Admit: 2014-12-08 | Discharge: 2014-12-08 | Disposition: A | Discharge status: Home / Self Care | Payer: BLUE CROSS/BLUE SHIELD | Attending: Emergency Medicine | Admitting: Emergency Medicine

## 2014-12-08 ENCOUNTER — Encounter (HOSPITAL_COMMUNITY): Payer: Self-pay | Admitting: Emergency Medicine

## 2014-12-08 DIAGNOSIS — R000 Tachycardia, unspecified: Secondary | ICD-10-CM | POA: Diagnosis not present

## 2014-12-08 DIAGNOSIS — Z90710 Acquired absence of both cervix and uterus: Secondary | ICD-10-CM | POA: Insufficient documentation

## 2014-12-08 DIAGNOSIS — R1032 Left lower quadrant pain: Secondary | ICD-10-CM | POA: Insufficient documentation

## 2014-12-08 DIAGNOSIS — R102 Pelvic and perineal pain: Secondary | ICD-10-CM | POA: Diagnosis not present

## 2014-12-08 DIAGNOSIS — R109 Unspecified abdominal pain: Secondary | ICD-10-CM

## 2014-12-08 DIAGNOSIS — Z8742 Personal history of other diseases of the female genital tract: Secondary | ICD-10-CM | POA: Insufficient documentation

## 2014-12-08 DIAGNOSIS — F329 Major depressive disorder, single episode, unspecified: Secondary | ICD-10-CM | POA: Insufficient documentation

## 2014-12-08 DIAGNOSIS — Z8589 Personal history of malignant neoplasm of other organs and systems: Secondary | ICD-10-CM | POA: Diagnosis not present

## 2014-12-08 DIAGNOSIS — Z3202 Encounter for pregnancy test, result negative: Secondary | ICD-10-CM | POA: Diagnosis not present

## 2014-12-08 DIAGNOSIS — Z853 Personal history of malignant neoplasm of breast: Secondary | ICD-10-CM | POA: Insufficient documentation

## 2014-12-08 LAB — CBC WITH DIFFERENTIAL/PLATELET
Basophils Absolute: 0 10*3/uL (ref 0.0–0.1)
Basophils Relative: 0 % (ref 0–1)
EOS ABS: 0 10*3/uL (ref 0.0–0.7)
Eosinophils Relative: 1 % (ref 0–5)
HCT: 35.9 % — ABNORMAL LOW (ref 36.0–46.0)
Hemoglobin: 12.2 g/dL (ref 12.0–15.0)
LYMPHS PCT: 29 % (ref 12–46)
Lymphs Abs: 2.5 10*3/uL (ref 0.7–4.0)
MCH: 28.2 pg (ref 26.0–34.0)
MCHC: 34 g/dL (ref 30.0–36.0)
MCV: 82.9 fL (ref 78.0–100.0)
MONOS PCT: 8 % (ref 3–12)
Monocytes Absolute: 0.7 10*3/uL (ref 0.1–1.0)
Neutro Abs: 5.6 10*3/uL (ref 1.7–7.7)
Neutrophils Relative %: 62 % (ref 43–77)
PLATELETS: 318 10*3/uL (ref 150–400)
RBC: 4.33 MIL/uL (ref 3.87–5.11)
RDW: 12.9 % (ref 11.5–15.5)
WBC: 8.8 10*3/uL (ref 4.0–10.5)

## 2014-12-08 LAB — WET PREP, GENITAL
CLUE CELLS WET PREP: NONE SEEN
Trich, Wet Prep: NONE SEEN
WBC, Wet Prep HPF POC: NONE SEEN
YEAST WET PREP: NONE SEEN

## 2014-12-08 LAB — COMPREHENSIVE METABOLIC PANEL
ALBUMIN: 4.6 g/dL (ref 3.5–5.0)
ALT: 8 U/L — AB (ref 14–54)
ANION GAP: 11 (ref 5–15)
AST: 19 U/L (ref 15–41)
Alkaline Phosphatase: 68 U/L (ref 38–126)
BUN: 7 mg/dL (ref 6–20)
CO2: 20 mmol/L — AB (ref 22–32)
Calcium: 9.8 mg/dL (ref 8.9–10.3)
Chloride: 106 mmol/L (ref 101–111)
Creatinine, Ser: 0.62 mg/dL (ref 0.44–1.00)
GFR calc Af Amer: >60 mL/min (ref >60)
GFR calc non Af Amer: >60 mL/min (ref >60)
Glucose, Bld: 113 mg/dL — ABNORMAL HIGH (ref 65–99)
POTASSIUM: 3.3 mmol/L — AB (ref 3.5–5.1)
Sodium: 137 mmol/L (ref 135–145)
TOTAL PROTEIN: 7.8 g/dL (ref 6.5–8.1)
Total Bilirubin: 0.5 mg/dL (ref 0.3–1.2)

## 2014-12-08 LAB — URINALYSIS, ROUTINE W REFLEX MICROSCOPIC
Bilirubin Urine: NEGATIVE
Glucose, UA: NEGATIVE mg/dL
Hgb urine dipstick: NEGATIVE
KETONES UR: 15 mg/dL — AB
Leukocytes, UA: NEGATIVE
NITRITE: NEGATIVE
PH: 6 (ref 5.0–8.0)
PROTEIN: NEGATIVE mg/dL
Specific Gravity, Urine: 1.027 (ref 1.005–1.030)
Urobilinogen, UA: 0.2 mg/dL (ref 0.0–1.0)

## 2014-12-08 LAB — PREGNANCY, URINE: Preg Test, Ur: NEGATIVE

## 2014-12-08 MED ORDER — ONDANSETRON HCL 4 MG/2ML IJ SOLN
INTRAMUSCULAR | Status: AC
Start: 2014-12-08 — End: 2014-12-08
  Administered 2014-12-08: 4 mg
  Filled 2014-12-08: qty 2

## 2014-12-08 MED ORDER — KETOROLAC TROMETHAMINE 30 MG/ML IJ SOLN
30.0000 mg | Freq: Once | INTRAMUSCULAR | Status: AC
  Administered 2014-12-08: 30 mg via INTRAVENOUS
  Filled 2014-12-08: qty 1

## 2014-12-08 MED ORDER — ONDANSETRON 4 MG PO TBDP: 4.0000 mg | ORAL_TABLET | Freq: Three times a day (TID) | ORAL | Status: DC | PRN

## 2014-12-08 MED ORDER — OXYCODONE-ACETAMINOPHEN 5-325 MG PO TABS: 1.0000 | ORAL_TABLET | ORAL | Status: DC | PRN

## 2014-12-08 MED ORDER — FLUCONAZOLE 150 MG PO TABS: 150.0000 mg | ORAL_TABLET | Freq: Once | ORAL | Status: DC

## 2014-12-08 MED ORDER — METRONIDAZOLE 500 MG PO TABS: 500.0000 mg | ORAL_TABLET | Freq: Two times a day (BID) | ORAL | Status: DC

## 2014-12-08 MED ORDER — IOHEXOL 300 MG/ML  SOLN
80.0000 mL | Freq: Once | INTRAMUSCULAR | Status: AC | PRN
  Administered 2014-12-08: 80 mL via INTRAVENOUS

## 2014-12-08 MED ORDER — ONDANSETRON HCL 4 MG/2ML IJ SOLN
4.0000 mg | Freq: Once | INTRAMUSCULAR | Status: AC
  Administered 2014-12-08: 4 mg via INTRAVENOUS
  Filled 2014-12-08: qty 2

## 2014-12-08 MED ORDER — FENTANYL CITRATE (PF) 100 MCG/2ML IJ SOLN
100.0000 ug | Freq: Once | INTRAMUSCULAR | Status: AC
  Administered 2014-12-08: 100 ug via INTRAVENOUS
  Filled 2014-12-08: qty 2

## 2014-12-08 MED ORDER — NAPROXEN 500 MG PO TABS: 500.0000 mg | ORAL_TABLET | Freq: Two times a day (BID) | ORAL | Status: DC

## 2014-12-08 MED ORDER — IOHEXOL 300 MG/ML  SOLN
25.0000 mL | Freq: Once | INTRAMUSCULAR | Status: AC | PRN
  Administered 2014-12-08: 25 mL via ORAL

## 2014-12-08 NOTE — ED Notes (Signed)
Pt. Stated, I had a hysterectomy 6 months ago and left one ovary and I was fine until one week ago and I stared having stomach pain. Denies any other symptoms

## 2014-12-08 NOTE — Discharge Instructions (Signed)

## 2014-12-08 NOTE — ED Provider Notes (Signed)
CSN: [id number]     Arrival date & time 12/08/14  1328 History   First MD Initiated Contact with Patient 12/08/14 1519     Chief Complaint  Patient presents with  . Abdominal Pain     (Consider location/radiation/quality/duration/timing/severity/associated sxs/prior Treatment) HPI Comments: The patient is a 34 year old female, she has a history of ovarian cysts, history of tubal ligation and a reported history of a hysterectomy at [facility]. She presents to the hospital with a complaint of lower pelvic pain. This pain is on the left side, she thinks this is where her remaining ovary is. It started 1 week ago, persistent, gradually worsening, never pain-free, associated with intermittent bouts of nausea. She has no fevers chills diarrhea dysuria or vaginal bleeding. There is no vaginal discharge. Her appetite has been decreased.  Patient is a 34 y.o. female presenting with abdominal pain. The history is provided by the patient.  Abdominal Pain   Past Medical History  Diagnosis Date  . Fibromyalgia   . Back pain   . Ovarian cyst   . Cancer     breast CA at 34 y/o  . Cancer of spine     spine CA for past 10 years  . Breast cancer   . Depression    Past Surgical History  Procedure Laterality Date  . Tubal ligation    . Cesarean section    . Ovarian cyst removal     Family History  Problem Relation Age of Onset  . Hypertension Mother   . Anemia Mother   . Cancer Maternal Aunt     breast  . Kidney disease Maternal Grandmother   . Diabetes Maternal Grandmother   . Breast cancer Maternal Grandmother 78  . Diabetes Paternal Grandfather   . Lupus Other   . Multiple sclerosis Other   . Breast cancer Sister 70  . Breast cancer Paternal Aunt 56  . Diabetes Maternal Grandfather   . Cancer Paternal Grandmother     NOS  . Breast cancer Paternal Aunt 36  . Breast cancer Paternal Aunt   . Ovarian cancer Paternal Aunt    History  Substance Use Topics  .  Smoking status: Never Smoker   . Smokeless tobacco: Not on file  . Alcohol Use: No   OB History    Gravida Para Term Preterm AB TAB SAB Ectopic Multiple Living   3 1  1 2  2   1      Review of Systems  Gastrointestinal: Positive for abdominal pain.  All other systems reviewed and are negative.     Allergies  Review of patient's allergies indicates no known allergies.  Home Medications   Prior to Admission medications   Medication Sig Start Date End Date Taking? Authorizing Provider  diphenhydrAMINE (SOMINEX) 25 MG tablet Take 25 mg by mouth at bedtime as needed for itching or sleep.   Yes Historical Provider, MD  Polyvinyl Alcohol-Povidone (REFRESH OP) Place 1 drop into both eyes daily as needed.   Yes Historical Provider, MD  ALPRAZolam Duanne Moron) 1 MG tablet Take 0.5-1 tablets (0.5-1 mg total) by mouth 2 (two) times daily as needed for anxiety. Patient not taking: Reported on 12/08/2014 04/24/14   Meredith Staggers, MD  citalopram (CELEXA) 20 MG tablet Take 1 tablet (20 mg total) by mouth at bedtime. Patient not taking: Reported on 12/08/2014 08/25/14   Meredith Staggers, MD  fluconazole (DIFLUCAN) 150 MG tablet Take 1 tablet (150 mg total) by mouth once.  12/08/14   Noemi Chapel, MD  HYDROcodone-acetaminophen (NORCO/VICODIN) 5-325 MG per tablet Take 1 tablet by mouth every 6 (six) hours as needed for moderate pain. Patient not taking: Reported on 12/08/2014 11/20/14   Meredith Staggers, MD  methocarbamol (ROBAXIN) 500 MG tablet Take 1 tablet (500 mg total) by mouth 3 (three) times daily. Patient not taking: Reported on 12/08/2014 11/28/14   Meredith Staggers, MD  metroNIDAZOLE (FLAGYL) 500 MG tablet Take 1 tablet (500 mg total) by mouth 2 (two) times daily. 12/08/14   Noemi Chapel, MD  naproxen (NAPROSYN) 500 MG tablet Take 1 tablet (500 mg total) by mouth 2 (two) times daily with a meal. 12/08/14   Noemi Chapel, MD  ondansetron (ZOFRAN ODT) 4 MG disintegrating tablet Take 1 tablet (4 mg total)  by mouth every 8 (eight) hours as needed for nausea. 12/08/14   Noemi Chapel, MD  oxyCODONE-acetaminophen (PERCOCET) 5-325 MG per tablet Take 1 tablet by mouth every 4 (four) hours as needed. 12/08/14   Noemi Chapel, MD  traZODone (DESYREL) 50 MG tablet Take 1 tablet (50 mg total) by mouth at bedtime. Patient not taking: Reported on 12/08/2014 08/25/14   Meredith Staggers, MD   BP 125/68 mmHg  Pulse 74  Temp(Src) 98.6 F (37 C) (Oral)  Resp 18  Ht 5\' 3"  (1.6 m)  Wt 145 lb (65.772 kg)  BMI 25.69 kg/m2  SpO2 100%  LMP 02/16/2014 Physical Exam  Constitutional: She appears well-developed and well-nourished.  Uncomfortable appearing  HENT:  Head: Normocephalic and atraumatic.  Mouth/Throat: Oropharynx is clear and moist. No oropharyngeal exudate.  Eyes: Conjunctivae and EOM are normal. Pupils are equal, round, and reactive to light. Right eye exhibits no discharge. Left eye exhibits no discharge. No scleral icterus.  Neck: Normal range of motion. Neck supple. No JVD present. No thyromegaly present.  Cardiovascular: Regular rhythm, normal heart sounds and intact distal pulses.  Exam reveals no gallop and no friction rub.   No murmur heard. Mild tachycardia  Pulmonary/Chest: Effort normal and breath sounds normal. No respiratory distress. She has no wheezes. She has no rales.  Abdominal: Soft. Bowel sounds are normal. She exhibits no distension and no mass. There is tenderness (tenderness in the left lower quadrant, very soft abdomen without guarding or masses).  Genitourinary:  Chaperone present for vaginal exam, external genitalia normal, internal exam shows thick white vaginal discharge, no bleeding, no tenderness in the bilateral adnexa, no foreign bodies, no foul smell  Musculoskeletal: Normal range of motion. She exhibits no edema or tenderness.  Lymphadenopathy:    She has no cervical adenopathy.  Neurological: She is alert. Coordination normal.  Skin: Skin is warm and dry. No rash  noted. No erythema.  Psychiatric: She has a normal mood and affect. Her behavior is normal.  Nursing note and vitals reviewed.   ED Course  Procedures (including critical care time) Labs Review Labs Reviewed  CBC WITH DIFFERENTIAL/PLATELET - Abnormal; Notable for the following:    HCT 35.9 (*)    All other components within normal limits  COMPREHENSIVE METABOLIC PANEL - Abnormal; Notable for the following:    Potassium 3.3 (*)    CO2 20 (*)    Glucose, Bld 113 (*)    ALT 8 (*)    All other components within normal limits  URINALYSIS, ROUTINE W REFLEX MICROSCOPIC (NOT AT [facility]) - Abnormal; Notable for the following:    Ketones, ur 15 (*)    All other components within normal limits  WET PREP, GENITAL  PREGNANCY, URINE  GC/CHLAMYDIA PROBE AMP ([place]) NOT AT [facility]    Imaging Review US Transvaginal Non-ob  12/08/2014   ADDENDUM REPORT: 12/08/2014 17:44  ADDENDUM: Right-left discrepancy in the original report. The report should read  Right ovary  Measurements: 5.3 x 3.4 x 3.0 cm . Irregularly-shaped hypoechoic area with internal echoes, likely collapsing hemorrhagic cyst measuring up to 2.9 cm. No adnexal masses.  Left ovary  Not visualized, compatible with prior left oophorectomy . No adnexal mass seen.  IMPRESSION: 2.9 cm collapsing hemorrhagic cyst within the right ovary.  No significant abnormality.  No evidence of torsion.  These results were called by telephone at the time of interpretation on 12/08/2014 at 5:42 pm to Dr. Noemi Chapel , who verbally acknowledged these results.   Electronically Signed   By: Rolm Baptise M.D.   On: 12/08/2014 17:44   12/08/2014   CLINICAL DATA:  Midline and left adnexal pain. Prior hysterectomy and removal of 1 ovary.  EXAM: TRANSABDOMINAL AND TRANSVAGINAL ULTRASOUND OF PELVIS  DOPPLER ULTRASOUND OF OVARIES  TECHNIQUE: Both transabdominal and transvaginal ultrasound examinations of the pelvis were performed. Transabdominal technique was performed for  global imaging of the pelvis including uterus, ovaries, adnexal regions, and pelvic cul-de-sac.  It was necessary to proceed with endovaginal exam following the transabdominal exam to visualize the left ovary/adnexa. Color and duplex Doppler ultrasound was utilized to evaluate blood flow to the ovaries.  COMPARISON:  12/10/2013  FINDINGS: Uterus  Measurements: Prior hysterectomy.  Endometrium  Thickness: N/A.  Right ovary  Measurements: Not visualize, presumably Prior right oophorectomy. No adnexal mass seen.  Left ovary  Measurements: 5.3 x 3.4 x 3.0 cm. Irregularly-shaped hypoechoic area with internal echoes, likely collapsing hemorrhagic cyst measuring up to 2.9 cm. No adnexal masses.  Pulsed Doppler evaluation of the left ovary demonstrates normal low-resistance arterial and venous waveforms.  Other findings  No free fluid.  IMPRESSION: Prior hysterectomy and presumed right oophorectomy.  2.9 cm collapsing hemorrhagic cyst in the right ovary. No significant abnormality. No evidence of torsion.  Electronically Signed: By: Rolm Baptise M.D. On: 12/08/2014 17:14   US Pelvis Complete  12/08/2014   ADDENDUM REPORT: 12/08/2014 17:44  ADDENDUM: Right-left discrepancy in the original report. The report should read  Right ovary  Measurements: 5.3 x 3.4 x 3.0 cm . Irregularly-shaped hypoechoic area with internal echoes, likely collapsing hemorrhagic cyst measuring up to 2.9 cm. No adnexal masses.  Left ovary  Not visualized, compatible with prior left oophorectomy . No adnexal mass seen.  IMPRESSION: 2.9 cm collapsing hemorrhagic cyst within the right ovary.  No significant abnormality.  No evidence of torsion.  These results were called by telephone at the time of interpretation on 12/08/2014 at 5:42 pm to Dr. Noemi Chapel , who verbally acknowledged these results.   Electronically Signed   By: Rolm Baptise M.D.   On: 12/08/2014 17:44   12/08/2014   CLINICAL DATA:  Midline and left adnexal pain. Prior hysterectomy and  removal of 1 ovary.  EXAM: TRANSABDOMINAL AND TRANSVAGINAL ULTRASOUND OF PELVIS  DOPPLER ULTRASOUND OF OVARIES  TECHNIQUE: Both transabdominal and transvaginal ultrasound examinations of the pelvis were performed. Transabdominal technique was performed for global imaging of the pelvis including uterus, ovaries, adnexal regions, and pelvic cul-de-sac.  It was necessary to proceed with endovaginal exam following the transabdominal exam to visualize the left ovary/adnexa. Color and duplex Doppler ultrasound was utilized to evaluate blood flow to the ovaries.  COMPARISON:  12/10/2013  FINDINGS: Uterus  Measurements: Prior hysterectomy.  Endometrium  Thickness: N/A.  Right ovary  Measurements: Not visualize, presumably Prior right oophorectomy. No adnexal mass seen.  Left ovary  Measurements: 5.3 x 3.4 x 3.0 cm. Irregularly-shaped hypoechoic area with internal echoes, likely collapsing hemorrhagic cyst measuring up to 2.9 cm. No adnexal masses.  Pulsed Doppler evaluation of the left ovary demonstrates normal low-resistance arterial and venous waveforms.  Other findings  No free fluid.  IMPRESSION: Prior hysterectomy and presumed right oophorectomy.  2.9 cm collapsing hemorrhagic cyst in the right ovary. No significant abnormality. No evidence of torsion.  Electronically Signed: By: Rolm Baptise M.D. On: 12/08/2014 17:14   Ct Abdomen Pelvis W Contrast  12/08/2014   CLINICAL DATA:  Worsening left lower quadrant abdominal pain, onset 1 week ago.  EXAM: CT ABDOMEN AND PELVIS WITH CONTRAST  TECHNIQUE: Multidetector CT imaging of the abdomen and pelvis was performed using the standard protocol following bolus administration of intravenous contrast.  CONTRAST:  47mL OMNIPAQUE IOHEXOL 300 MG/ML SOLN, 62mL OMNIPAQUE IOHEXOL 300 MG/ML SOLN  COMPARISON:  None.  FINDINGS: Lower chest:  No significant abnormality  Hepatobiliary: There are normal appearances of the liver, gallbladder and bile ducts.  Pancreas: Normal  Spleen:  Normal  Adrenals/Urinary Tract: The adrenals and kidneys are normal in appearance. There is no urinary calculus evident. There is no hydronephrosis or ureteral dilatation. Collecting systems and ureters appear unremarkable.  Stomach/Bowel: There are normal appearances of the stomach, small bowel and colon. The appendix is normal.  Vascular/Lymphatic: The abdominal aorta is normal in caliber. There is no atherosclerotic calcification. There is no adenopathy in the abdomen or pelvis.  Reproductive: The uterus and left ovary are absent. The right ovary contains a partially collapsed 2 cm cyst.  Other: No acute inflammatory changes are evident in the abdomen or pelvis. There is no ascites.  Musculoskeletal: No significant musculoskeletal lesions. Incidentally noted small fat containing umbilical hernia.  IMPRESSION: Partially collapsed 2 cm right ovarian cyst. No acute inflammatory changes are evident in the abdomen or pelvis. The bowel and solid parenchymal organs appear unremarkable.   Electronically Signed   By: Andreas Newport M.D.   On: 12/08/2014 20:42   [place] Art/ven Flow Abd Pelv Doppler  12/08/2014   ADDENDUM REPORT: 12/08/2014 17:44  ADDENDUM: Right-left discrepancy in the original report. The report should read  Right ovary  Measurements: 5.3 x 3.4 x 3.0 cm . Irregularly-shaped hypoechoic area with internal echoes, likely collapsing hemorrhagic cyst measuring up to 2.9 cm. No adnexal masses.  Left ovary  Not visualized, compatible with prior left oophorectomy . No adnexal mass seen.  IMPRESSION: 2.9 cm collapsing hemorrhagic cyst within the right ovary.  No significant abnormality.  No evidence of torsion.  These results were called by telephone at the time of interpretation on 12/08/2014 at 5:42 pm to Dr. Noemi Chapel , who verbally acknowledged these results.   Electronically Signed   By: Rolm Baptise M.D.   On: 12/08/2014 17:44   12/08/2014   CLINICAL DATA:  Midline and left adnexal pain. Prior  hysterectomy and removal of 1 ovary.  EXAM: TRANSABDOMINAL AND TRANSVAGINAL ULTRASOUND OF PELVIS  DOPPLER ULTRASOUND OF OVARIES  TECHNIQUE: Both transabdominal and transvaginal ultrasound examinations of the pelvis were performed. Transabdominal technique was performed for global imaging of the pelvis including uterus, ovaries, adnexal regions, and pelvic cul-de-sac.  It was necessary to proceed with endovaginal exam following the transabdominal exam to visualize the left ovary/adnexa. Color  and duplex Doppler ultrasound was utilized to evaluate blood flow to the ovaries.  COMPARISON:  12/10/2013  FINDINGS: Uterus  Measurements: Prior hysterectomy.  Endometrium  Thickness: N/A.  Right ovary  Measurements: Not visualize, presumably Prior right oophorectomy. No adnexal mass seen.  Left ovary  Measurements: 5.3 x 3.4 x 3.0 cm. Irregularly-shaped hypoechoic area with internal echoes, likely collapsing hemorrhagic cyst measuring up to 2.9 cm. No adnexal masses.  Pulsed Doppler evaluation of the left ovary demonstrates normal low-resistance arterial and venous waveforms.  Other findings  No free fluid.  IMPRESSION: Prior hysterectomy and presumed right oophorectomy.  2.9 cm collapsing hemorrhagic cyst in the right ovary. No significant abnormality. No evidence of torsion.  Electronically Signed: By: Rolm Baptise M.D. On: 12/08/2014 17:14     MDM   Final diagnoses:  Pelvic pain in female  Abdominal pain  Abdominal pain    The patient is unsure which side her remaining ovary is on, would assume left side given the location of her pain. Would also consider diverticulitis, she has no fever, she is intermittently vomiting, borderline tachycardia. This is likely related to dehydration and poor oral intake. Obtain urinalysis and pelvic ultrasound. We'll follow the CT scan if no findings on ultrasound. The patient is in agreement, medications as below.  The pt has been given Diflucan and Flagyl as preventative  measures for likely infection, she is aware that she needs to follow up the studies. Her CT scan and ultrasound are both unremarkable, anticipate discharge.  Meds given in ED:  Medications  ondansetron (ZOFRAN) injection 4 mg (4 mg Intravenous Given 12/08/14 1633)  ketorolac (TORADOL) 30 MG/ML injection 30 mg (30 mg Intravenous Given 12/08/14 1633)  fentaNYL (SUBLIMAZE) injection 100 mcg (100 mcg Intravenous Given 12/08/14 1940)  iohexol (OMNIPAQUE) 300 MG/ML solution 25 mL (25 mLs Oral Contrast Given 12/08/14 2011)  ondansetron (ZOFRAN) 4 MG/2ML injection (4 mg  Given 12/08/14 2035)  iohexol (OMNIPAQUE) 300 MG/ML solution 80 mL (80 mLs Intravenous Contrast Given 12/08/14 2012)    Discharge Medication List as of 12/08/2014  8:49 PM    START taking these medications   Details  naproxen (NAPROSYN) 500 MG tablet Take 1 tablet (500 mg total) by mouth 2 (two) times daily with a meal., Starting 12/08/2014, Until Discontinued, Print    ondansetron (ZOFRAN ODT) 4 MG disintegrating tablet Take 1 tablet (4 mg total) by mouth every 8 (eight) hours as needed for nausea., Starting 12/08/2014, Until Discontinued, Print    oxyCODONE-acetaminophen (PERCOCET) 5-325 MG per tablet Take 1 tablet by mouth every 4 (four) hours as needed., Starting 12/08/2014, Until Discontinued, Print            Noemi Chapel, MD 12/08/14 2332

## 2014-12-08 NOTE — ED Notes (Signed)
Patient transported to CT 

## 2014-12-08 NOTE — ED Notes (Signed)
Pt returned from CT °

## 2014-12-11 ENCOUNTER — Telehealth: Payer: Self-pay | Admitting: *Deleted

## 2014-12-11 LAB — GC/CHLAMYDIA PROBE AMP (~~LOC~~) NOT AT ARMC
Chlamydia: NEGATIVE
Neisseria Gonorrhea: NEGATIVE

## 2014-12-11 IMAGING — US US TRANSVAGINAL NON-OB
1 series · 13 of 25 positions shown · non-contrast
Comparison: None.

CLINICAL DATA: Left lower quadrant pain

TRANSABDOMINAL AND TRANSVAGINAL ULTRASOUND OF PELVIS
DOPPLER ULTRASOUND OF OVARIES
TECHNIQUE: Both transabdominal and transvaginal ultrasound
examinations of the pelvis were performed. Transabdominal technique
was performed for global imaging of the pelvis including uterus,
ovaries, adnexal regions, and pelvic cul-de-sac.
It was necessary to proceed with endovaginal exam following the
transabdominal exam to visualize the ovaries.
Color and duplex Doppler ultrasound was utilized to evaluate blood
flow to the ovaries.

[Series 1: us transvaginal non-ob · 0.21mm/px · 106 acquisitions, 13 frames shown]
[im 1/106]
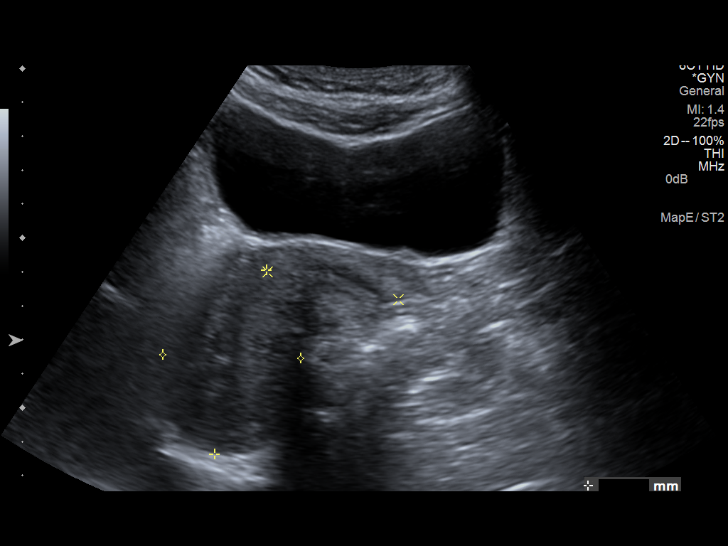
[im 9/106]
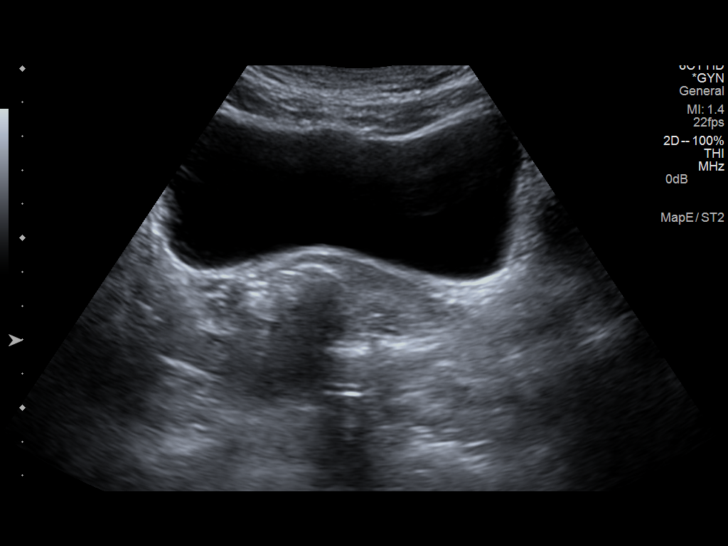
[im 18/106]
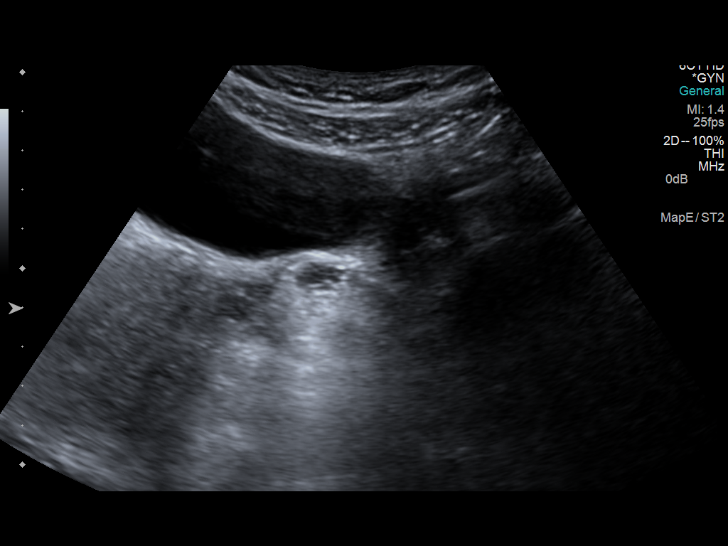
[im 27/106]
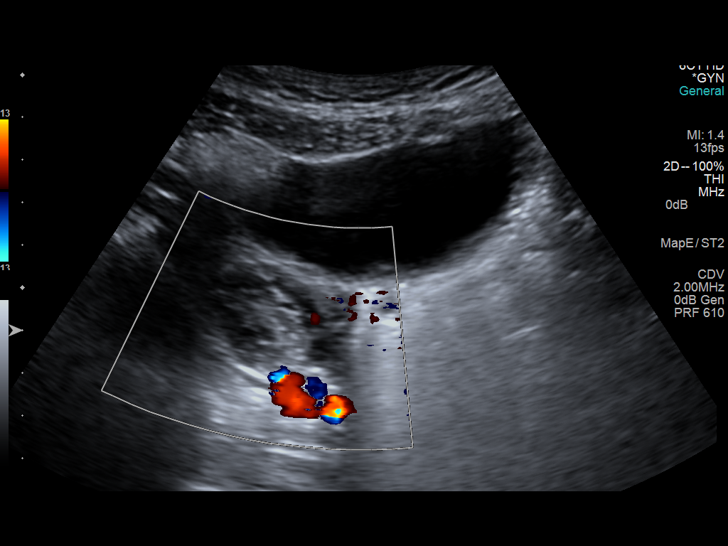
[im 36/106]
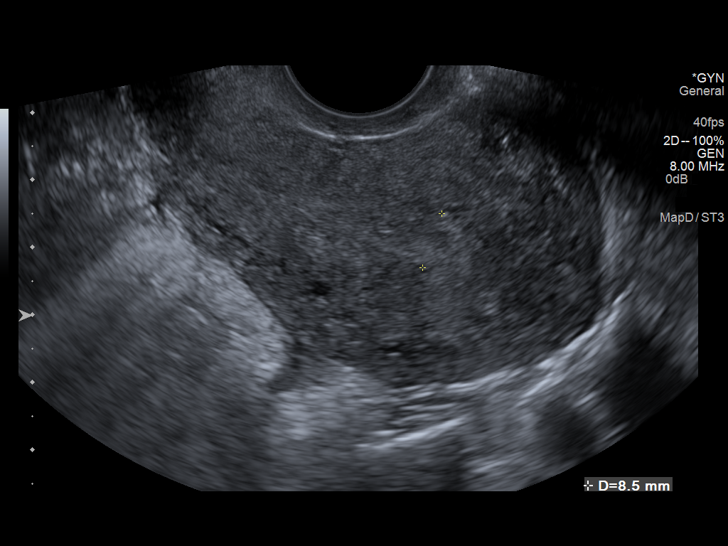
[im 44/106]
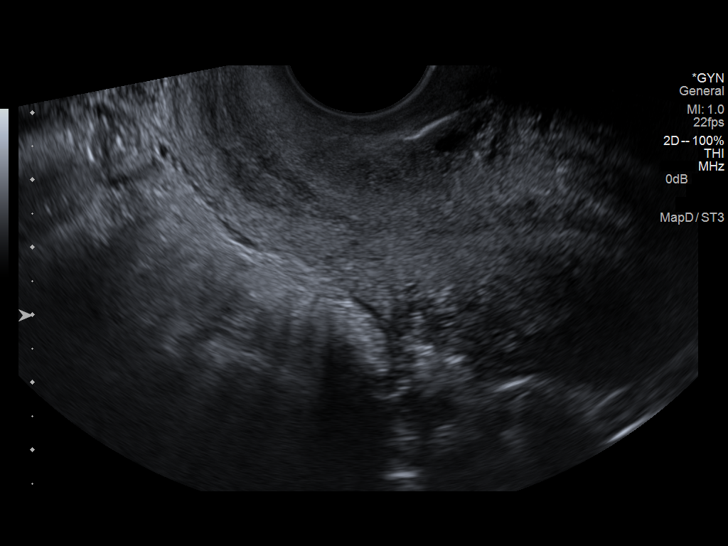
[im 53/106]
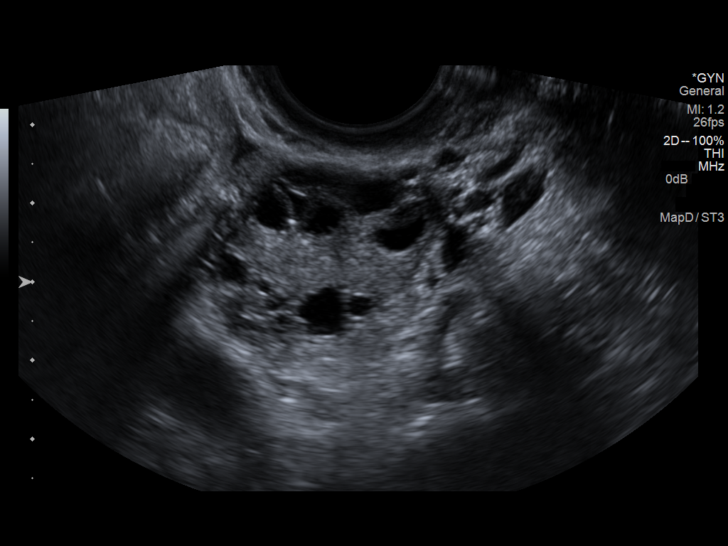
[im 62/106]
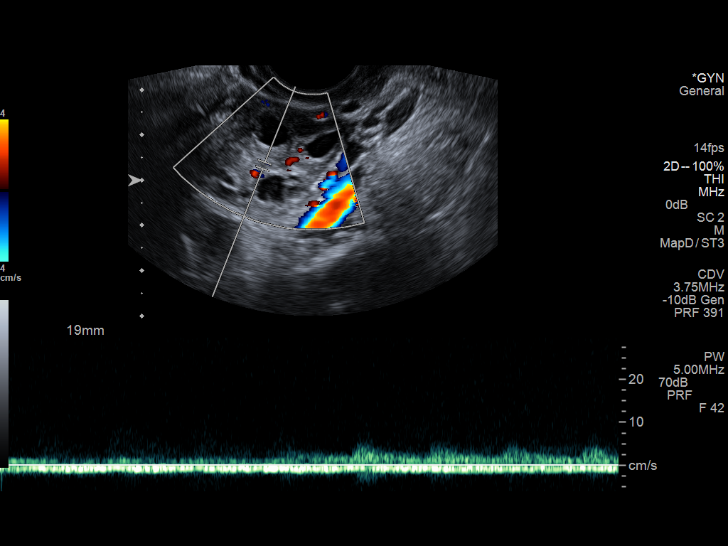
[im 71/106]
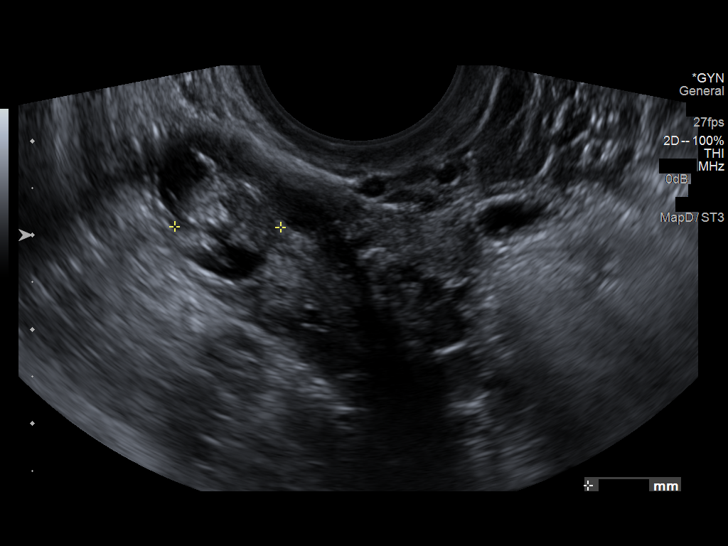
[im 79/106]
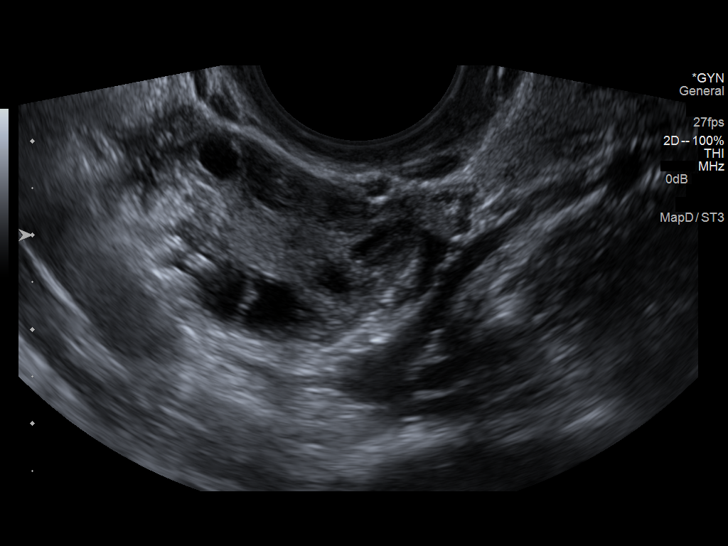
[im 88/106]
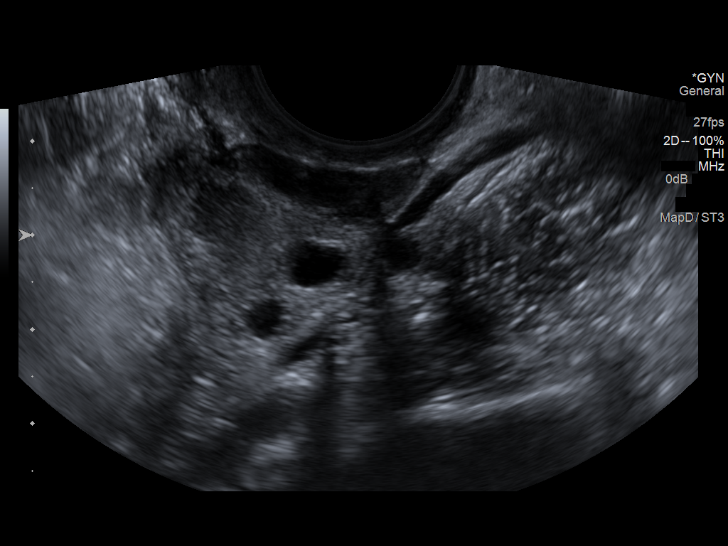
[im 97/106]
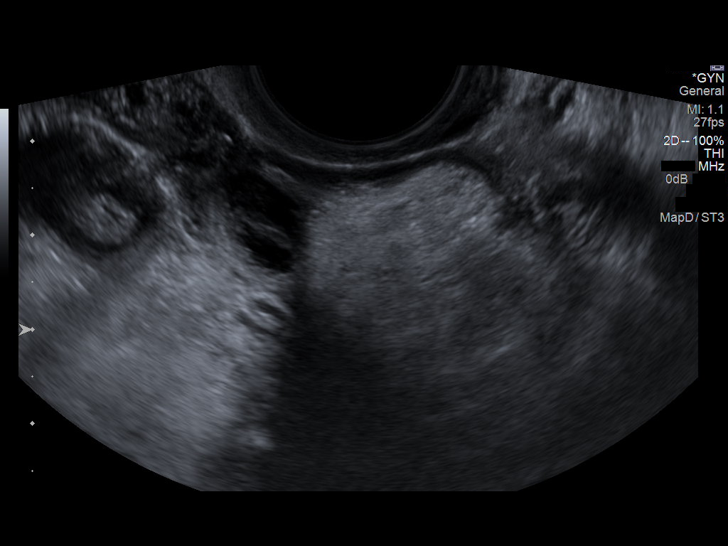
[im 106/106]
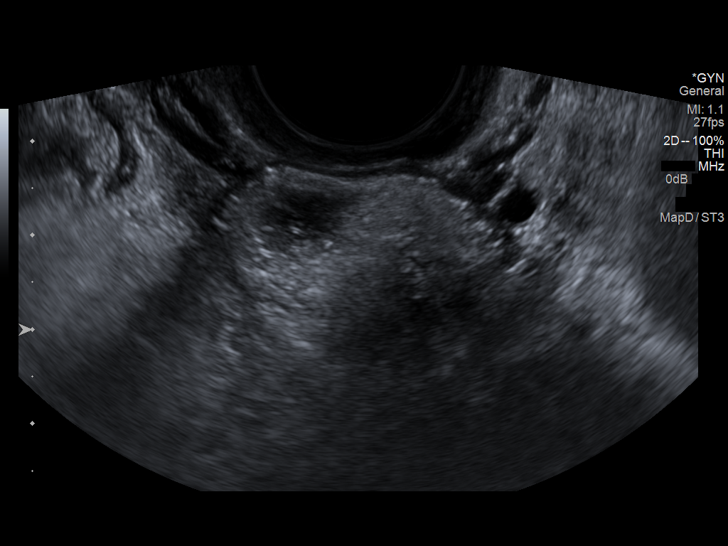

[13 of 25 positions shown; findings below may reference images not displayed]

FINDINGS

Uterus:  9.5 x 4.1 x 5.0 cm.  No myometrial masses.

Endometrium:  8.5 mm in thickness and uniform.

Right ovary: 3.0 x 2.3 x 2.0 cm.  The right ovary contains a
complex heterogeneous cystic lesion measuring 1.2 cm.Normal
arterial and venous wave forms are documented in the right ovary.

Left ovary: Normal ovarian tissue within the left adnexa measures
3.0 x 2.2 x 2.7 cm.  There is a 2.3 x 3.0 x 3.0 cm mass adjacent to
and inseparable from the left ovary that appears to be a
pedunculated left ovarian mass.  There is no definite color flow
within the mass.  Normal arterial and venous Doppler wave forms are
demonstrated in the left ovary.

Pulsed Doppler evaluation demonstrates normal low-resistance
arterial and venous waveforms in both ovaries.

Additional findings:  Trace free fluid.
IMPRESSION: No evidence of ovarian torsion.

1.2 cm complex cystic lesion in the right ovary.  This is
indeterminate that can be followed in 6 weeks to ensure resolution
as it may simply represent a hemorrhagic cyst.

3.0 x 3.0 x 2.3 cm lesion attached to the left ovary as described
above.  This may represent a benign lesion such as a dermoid
however it is indeterminate.  Consider MRI or surgical consultation
for further evaluation.

## 2014-12-11 NOTE — Telephone Encounter (Signed)
Jessica Horn called to report that she had to go to the ED over the weekend with abd pain and they could not find anything.  She later developed vomiting and diarrhea. She heard from her market that the cheese she had purchased from a local farm had a recall for salmonella, so this is more than likely what had caused it.  She called because Jessica Horn told her to always call if there is anything new or changes.  She is reporting this.  She did not fill the Rx for the percocet the ED MD gave her but she also did not fill the antibiotic since she did not want to violate anything with our office.  I called her back and told her that the only restriction is related to narcotics and she is free to fill the antibiotic.  She will do that.

## 2014-12-18 ENCOUNTER — Telehealth: Payer: Self-pay | Admitting: Psychology

## 2014-12-18 ENCOUNTER — Encounter: Payer: Self-pay | Admitting: Psychology

## 2014-12-18 NOTE — Telephone Encounter (Signed)
error 

## 2014-12-18 NOTE — Telephone Encounter (Signed)
I called Jessica Horn in regards to Dr. Valentina Shaggy concerns. She states she is experiencing a financial hardship at this time. She absolutely want to be under Dr. Valentina Shaggy care. She will send an email to Dr. Valentina Shaggy today she states.

## 2014-12-20 ENCOUNTER — Telehealth: Payer: Self-pay | Admitting: Psychology

## 2014-12-20 NOTE — Telephone Encounter (Signed)
Received an e-mail from Ms. Heppler on 12/18/14 explaining that she has not scheduled psychological treatment sessions since April 2016 due to her financial difficulties. Responded to her that I am happy to resume treatment whenever she is able.

## 2015-01-09 ENCOUNTER — Ambulatory Visit: Payer: BLUE CROSS/BLUE SHIELD | Attending: Psychology | Admitting: Psychology

## 2015-01-09 DIAGNOSIS — F339 Major depressive disorder, recurrent, unspecified: Secondary | ICD-10-CM | POA: Diagnosis not present

## 2015-01-09 DIAGNOSIS — F4310 Post-traumatic stress disorder, unspecified: Secondary | ICD-10-CM | POA: Diagnosis not present

## 2015-01-09 NOTE — Progress Notes (Signed)
[facility]  [address]   Telephone [phone] [address] Fax [phone] [place], [place] [address]   Psychology Progress Note   Name:  Jessica Horn Date of Birth:  [birth year] MRN:  [id number]  Date:01/09/2015 (4m) psychotherapy Ms. Fesperman was last seen on 08/19/14. She reported that she had suspended psychological treatment due to financial issues.  She reported that she has felt less depressed and anxious over the past few months. Interestingly, she has not taken any anti-depressant medications since March 2016. She stated that she did not consciously decide to stop these medications but that she forget a few doses and then never restarted. She reported continued high levels of pain and a high degree of pain disability. Since she was last seen, she started a volunteer position with [employer], has become more active as a "pioneer" for her church and has been working part-time delivering food to individual recipients. She has put her goal of adopting a child on hold for now. She has been lately worried about her sister (and by extension herself) after her sister was recently diagnosed with bilateral breast cancer. Another negative event was the death of a woman who she had considered to be like a mother to her. No major changes in her marital relationship were noted. She reported that they plan to follow-up with marital counseling as we had previously discussed.  Her affect appeared brighter. No overt signs of pain despite her report of a 9 out of 10 pain level. She explained that she is adept at concealing her pain from others. She denied suicidal ideation.  Diagnostic Impressions Major Depressive Disorder, recurrent with anxious distress [F33.9] Postraumatic Stress Disorder [F43.10]  Told her to inform her physician at her next visit that she is no longer taking antidepressant medication. Would suggest not to restart at this point given her  improved mood.   Return in two weeks.   Jamey Ripa, Ph.D Licensed Psychologist

## 2015-01-10 ENCOUNTER — Encounter: Payer: Self-pay | Admitting: Registered Nurse

## 2015-01-10 ENCOUNTER — Other Ambulatory Visit: Payer: Self-pay | Admitting: Registered Nurse

## 2015-01-10 ENCOUNTER — Encounter: Payer: BLUE CROSS/BLUE SHIELD | Attending: Physical Medicine & Rehabilitation | Admitting: Registered Nurse

## 2015-01-10 VITALS — BP 122/81 | HR 87

## 2015-01-10 DIAGNOSIS — F32A Depression, unspecified: Secondary | ICD-10-CM

## 2015-01-10 DIAGNOSIS — M7918 Myalgia, other site: Secondary | ICD-10-CM

## 2015-01-10 DIAGNOSIS — M546 Pain in thoracic spine: Secondary | ICD-10-CM | POA: Diagnosis present

## 2015-01-10 DIAGNOSIS — M791 Myalgia: Secondary | ICD-10-CM | POA: Diagnosis present

## 2015-01-10 DIAGNOSIS — F419 Anxiety disorder, unspecified: Secondary | ICD-10-CM

## 2015-01-10 DIAGNOSIS — F418 Other specified anxiety disorders: Secondary | ICD-10-CM | POA: Insufficient documentation

## 2015-01-10 DIAGNOSIS — F329 Major depressive disorder, single episode, unspecified: Secondary | ICD-10-CM

## 2015-01-10 DIAGNOSIS — M542 Cervicalgia: Secondary | ICD-10-CM | POA: Insufficient documentation

## 2015-01-10 DIAGNOSIS — Z5181 Encounter for therapeutic drug level monitoring: Secondary | ICD-10-CM

## 2015-01-10 DIAGNOSIS — G894 Chronic pain syndrome: Secondary | ICD-10-CM | POA: Diagnosis not present

## 2015-01-10 DIAGNOSIS — G4700 Insomnia, unspecified: Secondary | ICD-10-CM | POA: Insufficient documentation

## 2015-01-10 DIAGNOSIS — Z79899 Other long term (current) drug therapy: Secondary | ICD-10-CM

## 2015-01-10 MED ORDER — HYDROCODONE-ACETAMINOPHEN 5-325 MG PO TABS: 1.0000 | ORAL_TABLET | Freq: Four times a day (QID) | ORAL | Status: DC | PRN

## 2015-01-10 NOTE — Progress Notes (Signed)
Subjective:    Patient ID: Jessica Horn, female    DOB: 23-Feb-1981, 35 y.o.   MRN: [id number]  HPI: Mrs. Zayna Toste is a 34 year old female who returns for follow up for chronic pain and medication refill. She says her pain is located in her mid- back. She rates her pain 8. Her current exercise regime is walking and light weight lifting items around her house.  She has increased her activity and noticed significant improvement with her mood. Also volunteering weekly at [employer]. She is euphoric expressing her volunteering.  Pain Inventory Average Pain 8 Pain Right Now 8 My pain is sharp and stabbing  In the last 24 hours, has pain interfered with the following? General activity 7 Relation with others 7 Enjoyment of life 7 What TIME of day is your pain at its worst? morning daytime and evening Sleep (in general) Poor  Pain is worse with: unsure Pain improves with: medication Relief from Meds: 7  Mobility use a cane how many minutes can you walk? 15 ability to climb steps?  yes do you drive?  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  no  Physicians involved in your care Any changes since last visit?  no   Family History  Problem Relation Age of Onset  . Hypertension Mother   . Anemia Mother   . Cancer Maternal Aunt     breast  . Kidney disease Maternal Grandmother   . Diabetes Maternal Grandmother   . Breast cancer Maternal Grandmother 78  . Diabetes Paternal Grandfather   . Lupus Other   . Multiple sclerosis Other   . Breast cancer Sister 74  . Breast cancer Paternal Aunt 31  . Diabetes Maternal Grandfather   . Cancer Paternal Grandmother     NOS  . Breast cancer Paternal Aunt 103  . Breast cancer Paternal Aunt   . Ovarian cancer  Paternal Aunt    Social History   Social History  . Marital Status: Married    Spouse Name: N/A  . Number of Children: 1  . Years of Education: N/A   Social History Main Topics  . Smoking status: Never Smoker   . Smokeless tobacco: None  . Alcohol Use: No  . Drug Use: No  . Sexual Activity: Yes   Other Topics Concern  . None   Social History Narrative   Past Surgical History  Procedure Laterality Date  . Tubal ligation    . Cesarean section    . Ovarian cyst removal     Past Medical History  Diagnosis Date  . Fibromyalgia   . Back pain   . Ovarian cyst   . Cancer     breast CA at 34 y/o  . Cancer of spine     spine CA for past 10 years  . Breast cancer   . Depression    BP 122/81 mmHg  Pulse 87  SpO2 100%  LMP 02/16/2014  Opioid Risk Score:   Fall Risk Score:  `1  Depression screen PHQ 2/9  Depression screen [facility] 2/9 01/10/2015 07/28/2014  Decreased Interest 1 1  Down, Depressed, Hopeless 0 0  PHQ - 2 Score 1 1  Altered sleeping - 3  Tired, decreased energy - 2  Change in appetite -  3  Feeling bad or failure about yourself  - 0  Trouble concentrating - 1  Moving slowly or fidgety/restless - 0  Suicidal thoughts - 0  PHQ-9 Score - 10     Review of Systems  Gastrointestinal: Positive for nausea, vomiting and abdominal pain.  All other systems reviewed and are negative.      Objective:   Physical Exam  Constitutional: She is oriented to person, place, and time. She appears well-developed and well-nourished.  HENT:  Head: Normocephalic and atraumatic.  Neck: Normal range of motion. Neck supple.  Cardiovascular: Normal rate and regular rhythm.   Pulmonary/Chest: Effort normal and breath sounds normal.  Musculoskeletal:  Normal Muscle Bulk and Muscle Testing Reveals: Upper Extremities: Full ROM and Muscle Strength 5/5 Thoracic Paraspinal Tenderness: T-7- T-8 Lower Extremities: Full ROM and Muscle Strength 5/5 Arises from chair with  ease Narrow Based Gait  Neurological: She is alert and oriented to person, place, and time.  Skin: Skin is warm and dry.  Psychiatric: She has a normal mood and affect.  Nursing note and vitals reviewed.         Assessment & Plan:  1. Chronic cervico-thoracic axial spine pain./Mypfascial Pain: Refilled: Hydrocodone 5/325 mg one tablet every 6 hours as needed #120. Second script given for the following month. 2. 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  3. Depression with anxiety: Continue Celexa and Xanax. Dr. Valentina Shaggy Following 4. Insomnia: Continue Trazodone. Continue to Monitor. 5. Hx of breast cancer rx'ed in 2004? With CTX and XRT : GYN Following 6. Hx of Von Willebrand's disease : GYN Following 7. Ovarian Mass: S/P Hysterectomy on 04/18/2014 8. Muscle Spasm: Continue Robaxin  20 min of face to face patient care time was spent during this visit. All questions were encouraged and answered.  F/U in 2 months.

## 2015-01-11 LAB — PMP ALCOHOL METABOLITE (ETG): Ethyl Glucuronide (EtG): NEGATIVE ng/mL

## 2015-01-12 ENCOUNTER — Ambulatory Visit: Payer: BLUE CROSS/BLUE SHIELD | Admitting: Physical Medicine & Rehabilitation

## 2015-01-14 LAB — TRAMADOL, URINE
N-DESMETHYL-CIS-TRAMADOL: 11643 ng/mL — AB (ref <100)
Tramadol, Urine: 27985 ng/mL — AB (ref <100)

## 2015-01-16 LAB — PRESCRIPTION MONITORING PROFILE (SOLSTAS)
Amphetamine/Meth: NEGATIVE ng/mL
Barbiturate Screen, Urine: NEGATIVE ng/mL
Benzodiazepine Screen, Urine: NEGATIVE ng/mL
Buprenorphine, Urine: NEGATIVE ng/mL
CARISOPRODOL, URINE: NEGATIVE ng/mL
Cannabinoid Scrn, Ur: NEGATIVE ng/mL
Cocaine Metabolites: NEGATIVE ng/mL
Creatinine, Urine: 245.9 mg/dL (ref >20.0)
ECSTASY: NEGATIVE ng/mL
Fentanyl, Ur: NEGATIVE ng/mL
METHADONE SCREEN, URINE: NEGATIVE ng/mL
Meperidine, Ur: NEGATIVE ng/mL
NITRITES URINE, INITIAL: NEGATIVE ug/mL
Opiate Screen, Urine: NEGATIVE ng/mL
Oxycodone Screen, Ur: NEGATIVE ng/mL
PROPOXYPHENE: NEGATIVE ng/mL
Tapentadol, urine: NEGATIVE ng/mL
Zolpidem, Urine: NEGATIVE ng/mL
pH, Initial: 6.4 pH (ref 4.5–8.9)

## 2015-01-19 NOTE — Progress Notes (Addendum)
Urine drug screen for this encounter is inconsistent for prescribed medication.  She reported she took her hydrocodone last 01/09/15, test date 01/10/15. There is no hydrocodone or metabolites present.  There is however large amounts of tramadol and its metabolite.  She has never been prescribed tramadol per [place].  This is after multiple red flag events involving medications.  Please advise (specific incidents in note on your desk)

## 2015-01-23 ENCOUNTER — Telehealth: Payer: Self-pay | Admitting: *Deleted

## 2015-01-23 ENCOUNTER — Ambulatory Visit: Payer: BLUE CROSS/BLUE SHIELD | Admitting: Psychology

## 2015-01-23 NOTE — Telephone Encounter (Signed)
Pt called, says she was seen recently and rec'd her 2 months worth of scripts for her pain medication.  She says she forgot to bring up her need for refills on her methocarbamol and is asking [place] to refill.

## 2015-01-23 NOTE — Telephone Encounter (Signed)
Refill were given on the methocarbamol 11/28/14.  I called and spoke with pharmacy and found out that [name] has been filling her 30 day rx of methocarbamol #90 every 2 weeks and has used all four refills on this medication though it was to be taken q 8 hrs prn which would have been a 30 day RX.Jessica Horn Because of her last urine drug screen being negative for hydrocodone and positive for tramadol which is not prescribed and she also filled the Rx for percocet from ED that she said she did not fill, [name] is being discharged from our clinic.  A letter will be mailed.

## 2015-01-24 ENCOUNTER — Other Ambulatory Visit: Payer: Self-pay | Admitting: Physical Medicine & Rehabilitation

## 2015-01-26 ENCOUNTER — Telehealth: Payer: Self-pay | Admitting: *Deleted

## 2015-01-26 NOTE — Telephone Encounter (Signed)
Jessica Horn called asking where her methocarbamol refill is and why it has not been refilled.  I told Jessica Seith that she was given an RX on 11/28/14 with 3 refills, directions one tablet tid prn #90.  This was a one month supply and she has filled them every 2 weeks (confirmed with pharmacy)and apparently used them all which is not following MD directions.  She stated that her husband picks up her prescriptions and she will have to look around for them. I explained that she is going to be receiving a letter in the mail stating that Dr Naaman Plummer is discharging her from the clinic due to failed drug screen.  She was negative her hydrocodone which we are prescribing and positive for tramadol which we are not prescribing.  Her defense was that  she was given tramadol from her hysterectomy last year.  I informed her that I pulled a [place] on her for last 2 years and it does not reveal that she has been prescribed tramadol, and she did not report that she had taken this medication, and it does not explain why she did not have any of her hydrocodone in her system --which she did report as taken.  I further explained that she called [place] to report going to the ED and got an antibiotic and did not want to violate her contract by getting the antibiotic filled and stated she did not fill her percocet.  Per phone message 12/11/14 with her, I informed her that an antibiotic is not a problem but she could not fill the percocet. She said she had not and would not. It was revealed later that she did fill the percocet at [facility] which I confirmed with her pharmacist. Her reply today was that the pharmacist would not fill just one RX off the page and she had to take accept it, and that she offered to bring in to be destroyed but was told she did not need to do this. (this offer was never extended). Based on the two violations she is being discharged, but I reviewed with Jessica Matt that she has had multiple incidences with her  medications such as not being present in urine, flushing her medication down toilet and then asking for a refill, reporting luggage stolen with medication in it and requesting refill, and having alcohol present in her urine test. So the decision is final on discharge.  When she asked what she should do I explained that a list of area pain clinics was in the letter she will receive and she should have her PCP referr her to another clinic if she chooses. She was given an additional RX for this month at her 01/10/15 appointment with Zella Ball so that she has a 30 day supply to cover her during her search for a new provider ( although with her narcotic being absent from her drug screen would not require [place] to provide a 30 day supply). She said that she does not have a PCP and I said that everyone needs a primary care provider and encouraged her to seek one out.

## 2015-01-31 ENCOUNTER — Other Ambulatory Visit: Payer: Self-pay | Admitting: Family Medicine

## 2015-01-31 ENCOUNTER — Ambulatory Visit: Payer: BLUE CROSS/BLUE SHIELD | Attending: Psychology | Admitting: Psychology

## 2015-01-31 DIAGNOSIS — F339 Major depressive disorder, recurrent, unspecified: Secondary | ICD-10-CM | POA: Diagnosis not present

## 2015-01-31 DIAGNOSIS — M549 Dorsalgia, unspecified: Secondary | ICD-10-CM

## 2015-01-31 DIAGNOSIS — F4310 Post-traumatic stress disorder, unspecified: Secondary | ICD-10-CM | POA: Diagnosis not present

## 2015-02-01 ENCOUNTER — Encounter: Payer: Self-pay | Admitting: Psychology

## 2015-02-01 NOTE — Progress Notes (Signed)
[facility]  [address]   Telephone [phone] [address] Fax [phone] [place], [place] [address]   Psychology Progress Note   Name:  Jessica Horn Date of Birth:  04-Jan-1981 MRN:  [id number]  Date:02/01/2015 (17m) psychotherapy She reports that she was discharged from pain management clinic (Dr. Naaman Plummer) due to irregularities of her medication usage.She denied any intent to deceive and denied abuse of medications. She reported increased though tolerable pain levels since she stopped taking pain medications.She has recently seen a new primary care physician and might seek consultation with a sports medicine physician.The rest of the session was focussed on her conflicting feelings about her sister, who is currently battling breast cancer. She reports decent mood without recent panic attacks or suicidal ideation.  Diagnostic Impressions Major Depressive Disorder, recurrent with anxious distress [F33.9] Postraumatic Stress Disorder [F43.10]  Return in about two weeks.  Jamey Ripa, Ph.D Licensed Psychologist

## 2015-02-02 ENCOUNTER — Other Ambulatory Visit: Payer: Self-pay | Admitting: Family Medicine

## 2015-02-02 DIAGNOSIS — Z803 Family history of malignant neoplasm of breast: Secondary | ICD-10-CM

## 2015-02-02 DIAGNOSIS — N63 Unspecified lump in unspecified breast: Secondary | ICD-10-CM

## 2015-02-06 ENCOUNTER — Other Ambulatory Visit: Payer: Self-pay | Admitting: Family Medicine

## 2015-02-06 ENCOUNTER — Ambulatory Visit: Payer: BLUE CROSS/BLUE SHIELD | Admitting: Registered Nurse

## 2015-02-06 DIAGNOSIS — N63 Unspecified lump in unspecified breast: Secondary | ICD-10-CM

## 2015-02-06 DIAGNOSIS — Z803 Family history of malignant neoplasm of breast: Secondary | ICD-10-CM

## 2015-02-08 ENCOUNTER — Ambulatory Visit (INDEPENDENT_AMBULATORY_CARE_PROVIDER_SITE_OTHER): Payer: BLUE CROSS/BLUE SHIELD | Admitting: Psychology

## 2015-02-08 ENCOUNTER — Ambulatory Visit
Admission: RE | Admit: 2015-02-08 | Discharge: 2015-02-08 | Disposition: A | Discharge status: Home / Self Care | Payer: BLUE CROSS/BLUE SHIELD | Source: Ambulatory Visit | Attending: Family Medicine | Admitting: Family Medicine

## 2015-02-08 DIAGNOSIS — M549 Dorsalgia, unspecified: Secondary | ICD-10-CM

## 2015-02-08 DIAGNOSIS — F339 Major depressive disorder, recurrent, unspecified: Secondary | ICD-10-CM

## 2015-02-08 DIAGNOSIS — F4310 Post-traumatic stress disorder, unspecified: Secondary | ICD-10-CM

## 2015-02-08 NOTE — Progress Notes (Signed)
[facility]  [address]   Telephone [phone] [address] Fax [phone] [place], [place] [address]   Psychology Progress Note   Name:  Jessica Horn Date of Birth:  11-Sep-1980 MRN:  [id number]  Date:02/08/2015 (68m) psychotherapy She reported a sense that her depression is returning. We discussed recent situational triggers/stressors, including her sister's breast cancer, her increased worry about her own health (she is scheduled to undergo a mammogram later today), her increased pain level since she no longer uses pain medications following her discharge from the pain management clinic and her daughter's learning difficulties in school. Also addressed her dissatisfaction with her sexual relationship with husband.   Diagnostic Impressions Major Depressive Disorder, recurrent with anxious distress [F33.9]  Postraumatic Stress Disorder [F43.10]   Return in two weeks.   Jamey Ripa, Ph.D Licensed Psychologist

## 2015-02-12 ENCOUNTER — Ambulatory Visit
Admission: RE | Admit: 2015-02-12 | Discharge: 2015-02-12 | Disposition: A | Discharge status: Home / Self Care | Payer: BLUE CROSS/BLUE SHIELD | Source: Ambulatory Visit | Attending: Family Medicine | Admitting: Family Medicine

## 2015-02-12 DIAGNOSIS — N63 Unspecified lump in unspecified breast: Secondary | ICD-10-CM

## 2015-02-12 DIAGNOSIS — Z803 Family history of malignant neoplasm of breast: Secondary | ICD-10-CM

## 2015-02-21 ENCOUNTER — Ambulatory Visit: Payer: BLUE CROSS/BLUE SHIELD | Admitting: Physical Therapy

## 2015-02-23 ENCOUNTER — Ambulatory Visit: Payer: BLUE CROSS/BLUE SHIELD | Admitting: Psychology

## 2015-02-26 ENCOUNTER — Ambulatory Visit: Payer: BLUE CROSS/BLUE SHIELD | Admitting: Physical Medicine & Rehabilitation

## 2015-02-26 ENCOUNTER — Ambulatory Visit: Payer: BLUE CROSS/BLUE SHIELD | Attending: Psychology | Admitting: Physical Therapy

## 2015-02-26 ENCOUNTER — Encounter: Payer: Self-pay | Admitting: Physical Therapy

## 2015-02-26 DIAGNOSIS — M546 Pain in thoracic spine: Secondary | ICD-10-CM | POA: Diagnosis present

## 2015-02-26 DIAGNOSIS — F321 Major depressive disorder, single episode, moderate: Secondary | ICD-10-CM | POA: Insufficient documentation

## 2015-02-26 DIAGNOSIS — F339 Major depressive disorder, recurrent, unspecified: Secondary | ICD-10-CM | POA: Insufficient documentation

## 2015-02-26 DIAGNOSIS — F4310 Post-traumatic stress disorder, unspecified: Secondary | ICD-10-CM | POA: Insufficient documentation

## 2015-02-26 DIAGNOSIS — F410 Panic disorder [episodic paroxysmal anxiety] without agoraphobia: Secondary | ICD-10-CM | POA: Insufficient documentation

## 2015-02-26 NOTE — Therapy (Signed)
[facility] [address] [place], [place], [address] Phone: [phone]   Fax:  [phone]  Physical Therapy Treatment  Patient Details  Name: Jessica Horn MRN: [id number] Date of Birth: 07/24/1980 Referring Provider: London Pepper  Encounter Date: 02/26/2015      PT End of Session - 02/26/15 1217    Visit Number 1   Date for PT Re-Evaluation 04/09/15   PT Start Time 7035   PT Stop Time 1240   PT Time Calculation (min) 55 min   Activity Tolerance Patient limited by pain   Behavior During Therapy [facility] for tasks assessed/performed      Past Medical History  Diagnosis Date  . Fibromyalgia   . Back pain   . Ovarian cyst   . Cancer ([place])     breast CA at 34 y/o  . Cancer of spine ([place])     spine CA for past 10 years  . Breast cancer ([place])   . Depression     Past Surgical History  Procedure Laterality Date  . Tubal ligation    . Cesarean section    . Ovarian cyst removal      There were no vitals filed for this visit.  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 Patient reports her back pain has become progressively worse especially in the past 3 months. Stopped running 6 months ago.     Limitations Sitting;Standing;Walking   How long can you sit comfortably? not all   How long can you stand comfortably? 5 min.    How long can you walk comfortably? not comfortable   Patient Stated Goals get mobility back, lift child and move without pain   Currently in Pain? Yes   Pain Score 7    Pain Location Back   Pain Orientation Mid   Pain Descriptors / Indicators Sharp;Stabbing   Pain Type Chronic pain   Pain Onset More than a month ago   Pain Frequency Constant   Aggravating Factors  lifting, standing, movement, sitting.    Pain Relieving Factors heat   Multiple Pain  Sites No            [facility] PT Assessment - 02/26/15 0001    Assessment   Medical Diagnosis M54.9 back pain   Referring Provider [facility]   Onset Date/Surgical Date 11/10/14   Prior Therapy None   Precautions   Precautions Other (comment)   Precaution Comments No ultrasound   Balance Screen   Has the patient fallen in the past 6 months Yes  walking in kitchen when foot slipped   How many times? 2   Has the patient had a decrease in activity level because of a fear of falling?  No   Is the patient reluctant to leave their home because of a fear of falling?  No   Prior Function   Level of Independence Independent   Vocation Full time employment   Cognition   Overall Cognitive Status Within Functional Limits for tasks assessed   Observation/Other Assessments   Focus on Therapeutic Outcomes (FOTO)  62% limitation CL  goal is 45% limitation CK   Posture/Postural Control   Posture/Postural Control Postural limitations   Postural Limitations Rounded Shoulders;Forward head;Decreased lumbar lordosis   Posture Comments rib hump on the right .    AROM   Overall  AROM Comments full bil. shoulder ROM   Thoracic Flexion decreased by 25%   Thoracic Extension decreased by 25%   Thoracic - Right Side Bend decreased by 25%   Thoracic - Left Side Bend decreased by 25%   Thoracic - Right Rotation decreased by 100%   Thoracic - Left Rotation decreased by 75%   Palpation   SI assessment  T4-T8 decreased P-A mobility with pain   Palpation comment left rib cage decreased mobility with deep breath,                      [facility] Adult PT Treatment/Exercise - 02/26/15 0001    Modalities   Modalities Electrical Stimulation;Moist Heat   Moist Heat Therapy   Number Minutes Moist Heat 20 Minutes   Moist Heat Location Other (comment)  thoracic supine   Electrical Stimulation   Electrical Stimulation Location thoracic supine   Electrical Stimulation Action IFC   Electrical  Stimulation Parameters to patient tolerance, 20 min   Electrical Stimulation Goals Pain                  PT Short Term Goals - 02/26/15 1223    PT SHORT TERM GOAL #1   Title Independent with initial HEP   Time 3   Period Weeks   Status New   PT SHORT TERM GOAL #2   Title pain with activities decreased >/= 25%   Time 3   Period Weeks   Status New   PT SHORT TERM GOAL #3   Title understand correct posture during activities   Time 3   Period Weeks   Status New           PT Long Term Goals - 02/26/15 1224    PT LONG TERM GOAL #1   Title independent with HEP   Time 6   Period Weeks   Status New   PT LONG TERM GOAL #2   Title lift her daughter with pain decreased by 75%   Time 6   Period Weeks   Status New   PT LONG TERM GOAL #3   Title return to running with pain decreased >/= 75%   Time 6   Period Weeks   Status New   PT LONG TERM GOAL #4   Title improved thoracic movement making it easier to perfrom daily activities >/= 75%   Time 6   Period Weeks   Status New               Plan - 02/26/15 1218    Clinical Impression Statement Patient is a 34 year old female with diagnosis of Dorsalgia.  Patient has a history of breat cancer and spine cancer without radiation and no lymph nodes removed.  Pain level is 4/10 in thoracic spine constantly.  Pain is worse with movement, activity, lifting.  FOTO score is 62% limitaiton.  thoracic ROM deficits s follows: flexion decreased by 25%, extension decreased by 75%, right sidebending decreased by 50%, left sidebending decreased by 50%, right rotation decreased by 100%, and left rotation decrease by 50%.  Palpable tenderness located on bil. sides of T4-T8.  Decreased  mobility of left rib cage.  T3-T8 decreased P-A mobility.  Right rib hump. Patient will benefit  from physical therapy to decrease pain and improve mobility.    Pt will benefit from skilled therapeutic intervention in order to improve on the following  deficits Decreased activity tolerance;Decreased mobility;Decreased strength;Hypomobility;Impaired flexibility;Pain;Increased muscle spasms;Decreased endurance;Decreased  range of motion   Rehab Potential Excellent   Clinical Impairments Affecting Rehab Potential None   PT Frequency 2x / week   PT Duration 6 weeks   PT Treatment/Interventions Cryotherapy;Electrical Stimulation;Moist Heat;Therapeutic exercise;Therapeutic activities;Neuromuscular re-education;Patient/family education;Manual techniques;Passive range of motion  Home TENS unit   PT Next Visit Plan soft tissue work, Dealer stimulation, joint mobilization to thoracic, posture education, thoracic ROM exercises   PT Home Exercise Plan thoracic flexibility   Recommended Other Services None   Consulted and Agree with Plan of Care Patient        Problem List Patient Active Problem List   Diagnosis Date Noted  . Anxiety and depression 01/10/2014  . Von Willebrand disease ([place]) 03/04/2013  . Breast cancer ([place]) 11/05/2012  . Gait disorder 08/09/2012  . Thoracic spine pain 08/09/2012  . Myofascial pain 08/09/2012  . Tremor 08/09/2012  . Cervical spine pain 08/09/2012  . Insomnia 06/11/2012  . Chronic pain of multiple sites 06/11/2012  . Coordination abnormal 06/11/2012  . Abnormal weight gain 06/11/2012  . URI 10/10/2009  . INSOMNIA 10/10/2009  . GASTROENTERITIS 10/20/2008  . OTHER MALAISE AND FATIGUE 07/03/2008  . GERD 06/30/2008  . ABDOMINAL PAIN 06/30/2008  . DEPRESSION 06/26/2008  . ALLERGIC RHINITIS 06/26/2008  . VILLONODULAR SYNOVITIS, HAND 06/26/2008  . WRIST PAIN, LEFT 06/26/2008  . Personal history of malignant neoplasm of breast 06/26/2008    GRAY,CHERYL,PT 02/26/2015, 12:27 PM  [facility] [address] [place], [place], [address] Phone: [phone]   Fax:  [phone]  Name: Jessica Horn MRN: [id number] Date of Birth:  07-09-1980

## 2015-02-28 ENCOUNTER — Ambulatory Visit: Payer: BLUE CROSS/BLUE SHIELD | Admitting: Physical Medicine & Rehabilitation

## 2015-03-01 ENCOUNTER — Encounter: Payer: Self-pay | Admitting: Physical Therapy

## 2015-03-01 ENCOUNTER — Ambulatory Visit: Payer: BLUE CROSS/BLUE SHIELD | Admitting: Physical Therapy

## 2015-03-01 DIAGNOSIS — F321 Major depressive disorder, single episode, moderate: Secondary | ICD-10-CM

## 2015-03-01 DIAGNOSIS — F339 Major depressive disorder, recurrent, unspecified: Secondary | ICD-10-CM

## 2015-03-01 DIAGNOSIS — M546 Pain in thoracic spine: Secondary | ICD-10-CM

## 2015-03-01 DIAGNOSIS — F410 Panic disorder [episodic paroxysmal anxiety] without agoraphobia: Secondary | ICD-10-CM

## 2015-03-01 DIAGNOSIS — F4310 Post-traumatic stress disorder, unspecified: Secondary | ICD-10-CM

## 2015-03-01 NOTE — Therapy (Signed)
[facility] [address], [place], [place], [address] Phone: [phone]   Fax:  [phone]  Physical Therapy Treatment  Patient Details  Name: Jessica Horn MRN: [id number] Date of Birth: 03-08-1981 Referring Provider: London Pepper  Encounter Date: 03/01/2015      PT End of Session - 03/01/15 1657    Visit Number 2   Date for PT Re-Evaluation 04/09/15   PT Start Time 1625   PT Stop Time 1704   PT Time Calculation (min) 39 min   Activity Tolerance Patient limited by pain   Behavior During Therapy [facility] for tasks assessed/performed      Past Medical History  Diagnosis Date  . Fibromyalgia   . Back pain   . Ovarian cyst   . Cancer ([place])     breast CA at 34 y/o  . Cancer of spine ([place])     spine CA for past 10 years  . Breast cancer ([place])   . Depression     Past Surgical History  Procedure Laterality Date  . Tubal ligation    . Cesarean section    . Ovarian cyst removal      There were no vitals filed for this visit.  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 In the last 3 month the upper back become progressively worse, she stopped running 6 month ago.     Currently in Pain? Yes   Pain Score 5    Pain Location Thoracic   Pain Orientation Mid   Pain Descriptors / Indicators Sharp;Stabbing   Pain Type Chronic pain   Pain Onset More than a month ago   Pain Frequency Constant   Multiple Pain Sites No                         [facility] Adult PT Treatment/Exercise - 03/01/15 0001    Exercises   Exercises Lumbar   Modalities   Modalities Electrical Stimulation;Moist Heat   Moist Heat Therapy   Number Minutes Moist Heat 15 Minutes   Moist Heat Location Other (comment)  thoracic spine   Electrical Stimulation   Electrical Stimulation Location thoracic supine   Electrical Stimulation Action IFC   Electrical Stimulation Parameters to patients tolerance   Electrical Stimulation Goals Pain   Manual Therapy   Manual Therapy Soft tissue mobilization;Myofascial release   Manual therapy comments bil paraspinals thoracic    Myofascial Release upper thoracic quadrant                  PT Short Term Goals - 02/26/15 1223    PT SHORT TERM GOAL #1   Title Independent with initial HEP   Time 3   Period Weeks   Status New   PT SHORT TERM GOAL #2   Title pain with activities decreased >/= 25%   Time 3   Period Weeks   Status New   PT SHORT TERM GOAL #3   Title understand correct posture during activities   Time 3   Period Weeks   Status New           PT Long Term  Goals - 02/26/15 1224    PT LONG TERM GOAL #1   Title independent with HEP   Time 6   Period Weeks   Status New   PT LONG TERM GOAL #2   Title lift her daughter with pain decreased by 75%   Time 6   Period Weeks   Status New   PT LONG TERM GOAL #3   Title return to running with pain decreased >/= 75%   Time 6   Period Weeks   Status New   PT LONG TERM GOAL #4   Title improved thoracic movement making it easier to perfrom daily activities >/= 75%   Time 6   Period Weeks   Status New               Plan - 03/01/15 1657    Clinical Impression Statement Pt. is a 34 y. o. female with diagnosis of dorsalgia. Pt with palpaaple tightness bil thoracic paraspianalls. Pt with limited flexibility in thoracic spine. Pt will continue to benfit from skilled PT to improve thoracic mobi and reduce muscle tightness    Pt will benefit from skilled therapeutic intervention in order to improve on the following deficits Decreased activity  tolerance;Decreased mobility;Decreased strength;Hypomobility;Impaired flexibility;Pain;Increased muscle spasms;Decreased endurance;Decreased range of motion   Rehab Potential Excellent   PT Frequency 2x / week   PT Duration 6 weeks   PT Treatment/Interventions Cryotherapy;Electrical Stimulation;Moist Heat;Therapeutic exercise;Therapeutic activities;Neuromuscular re-education;Patient/family education;Manual techniques;Passive range of motion   PT Next Visit Plan Continue with soft tissue work, may add gentle flexibility for thoracic on faom roll or towelroll,    Consulted and Agree with Plan of Care Patient        Problem List Patient Active Problem List   Diagnosis Date Noted  . Anxiety and depression 01/10/2014  . Von Willebrand disease ([place]) 03/04/2013  . Breast cancer ([place]) 11/05/2012  . Gait disorder 08/09/2012  . Thoracic spine pain 08/09/2012  . Myofascial pain 08/09/2012  . Tremor 08/09/2012  . Cervical spine pain 08/09/2012  . Insomnia 06/11/2012  . Chronic pain of multiple sites 06/11/2012  . Coordination abnormal 06/11/2012  . Abnormal weight gain 06/11/2012  . URI 10/10/2009  . INSOMNIA 10/10/2009  . GASTROENTERITIS 10/20/2008  . OTHER MALAISE AND FATIGUE 07/03/2008  . GERD 06/30/2008  . ABDOMINAL PAIN 06/30/2008  . DEPRESSION 06/26/2008  . ALLERGIC RHINITIS 06/26/2008  . VILLONODULAR SYNOVITIS, HAND 06/26/2008  . WRIST PAIN, LEFT 06/26/2008  . Personal history of malignant neoplasm of breast 06/26/2008    NAUMANN-HOUEGNIFIO,Brizeida Mcmurry PTA 03/01/2015, 5:03 PM  [facility] [address], [place], [place], [address] Phone: [phone]   Fax:  [phone]  Name: Arletha Marschke MRN: [id number] Date of Birth: 1981-01-14

## 2015-03-05 ENCOUNTER — Encounter: Payer: Self-pay | Admitting: Physical Therapy

## 2015-03-05 ENCOUNTER — Ambulatory Visit: Payer: BLUE CROSS/BLUE SHIELD | Admitting: Physical Therapy

## 2015-03-05 DIAGNOSIS — F321 Major depressive disorder, single episode, moderate: Secondary | ICD-10-CM

## 2015-03-05 DIAGNOSIS — F410 Panic disorder [episodic paroxysmal anxiety] without agoraphobia: Secondary | ICD-10-CM

## 2015-03-05 DIAGNOSIS — F339 Major depressive disorder, recurrent, unspecified: Secondary | ICD-10-CM

## 2015-03-05 DIAGNOSIS — F4310 Post-traumatic stress disorder, unspecified: Secondary | ICD-10-CM

## 2015-03-05 DIAGNOSIS — M546 Pain in thoracic spine: Secondary | ICD-10-CM

## 2015-03-05 NOTE — Therapy (Signed)
[facility] [address] [place], [place], [address] Phone: [phone]   Fax:  [phone]  Physical Therapy Treatment  Patient Details  Name: Jessica Horn MRN: [id number] Date of Birth: 09-06-1980 Referring Provider: London Pepper  Encounter Date: 03/05/2015      PT End of Session - 03/05/15 1618    Visit Number 3   Date for PT Re-Evaluation 04/09/15   PT Start Time 9024   PT Stop Time 1630   PT Time Calculation (min) 60 min   Activity Tolerance Patient limited by pain   Behavior During Therapy [facility] for tasks assessed/performed      Past Medical History  Diagnosis Date  . Fibromyalgia   . Back pain   . Ovarian cyst   . Cancer ([place])     breast CA at 34 y/o  . Cancer of spine ([place])     spine CA for past 10 years  . Breast cancer ([place])   . Depression     Past Surgical History  Procedure Laterality Date  . Tubal ligation    . Cesarean section    . Ovarian cyst removal      There were no vitals filed for this visit.  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 Pt reports had a good day on Saturday, but since Sunday pt reports feeling intense stiffness in upper thoraic spine   Currently in Pain? Yes   Pain Score 7    Pain Location Thoracic   Pain Orientation Mid   Pain Descriptors / Indicators Sharp;Stabbing   Pain Type Chronic pain   Pain Onset More than a month ago   Pain Frequency Constant   Aggravating Factors  lifting, reaching for something, standing, moevemnt, sitting   Multiple Pain Sites No                          [facility] Adult PT Treatment/Exercise - 03/05/15 0001    Posture/Postural Control   Posture/Postural Control Postural limitations   Postural Limitations Rounded Shoulders;Forward head;Decreased lumbar lordosis   Posture Comments rib hump on the right .    Exercises   Exercises Lumbar;Shoulder   Lumbar Exercises: Seated   Other Seated Lumbar Exercises cat and camel in sitting 3 x 10 b/w activities    Shoulder Exercises: Supine   Other Supine Exercises decompression & elongation onr foam roll x 3 min,   overhead flexion x 10 on Foam roll   Other Supine Exercises selfmob over towelroll x 3 min   Modalities   Modalities Electrical Stimulation;Moist Heat   Moist Heat Therapy   Number Minutes Moist Heat 20 Minutes   Moist Heat Location Other (comment)   Electrical Stimulation   Electrical Stimulation Location thoracic supine   Electrical Stimulation Action IFC   Electrical Stimulation Parameters to patients tolerance   Electrical Stimulation Goals Pain   Manual Therapy   Manual Therapy Soft tissue mobilization   Manual therapy comments bil paraspinals thoracic    Myofascial Release upper thoracic quadrant                PT Education -  03/05/15 1617    Education provided Yes   Education Details selfmobilization in sitting and supine   Person(s) Educated Patient   Methods Explanation;Handout;Verbal cues   Comprehension Returned demonstration;Verbalized understanding          PT Short Term Goals - 03/05/15 1711    PT SHORT TERM GOAL #1   Title Independent with initial HEP   Time 3   Period Weeks   Status On-going   PT SHORT TERM GOAL #2   Title pain with activities decreased >/= 25%   Time 3   Period Weeks   Status On-going   PT SHORT TERM GOAL #3   Title understand correct posture during activities   Time 3   Period Weeks   Status On-going           PT Long Term Goals - 03/05/15 1712    PT LONG TERM GOAL #1   Title independent with HEP   Time 6   Period  Weeks   Status On-going   PT LONG TERM GOAL #2   Title lift her daughter with pain decreased by 75%   Time 6   Period Weeks   Status On-going   PT LONG TERM GOAL #3   Title return to running with pain decreased >/= 75%   Time 6   Period Weeks   Status On-going   PT LONG TERM GOAL #4   Title improved thoracic movement making it easier to perfrom daily activities >/= 75%   Time 6   Period Weeks   Status On-going               Plan - 03/05/15 1619    Clinical Impression Statement Pt is a 34 y.o. female with diagnois of dorsalgia in thoracic spine. Pt with palpaple tightness bil thoracic paraspinals. Pt arrived with incr discomfort today she felt release with self mobilisation exercises.   Pt will benefit from skilled therapeutic intervention in order to improve on the following deficits Decreased activity tolerance;Decreased mobility;Decreased strength;Hypomobility;Impaired flexibility;Pain;Increased muscle spasms;Decreased endurance;Decreased range of motion   Rehab Potential Excellent   PT Frequency 2x / week   PT Duration 6 weeks   PT Treatment/Interventions Cryotherapy;Electrical Stimulation;Moist Heat;Therapeutic exercise;Therapeutic activities;Neuromuscular re-education;Patient/family education;Manual techniques;Passive range of motion   PT Next Visit Plan Review selfmob with towel in sitting and supine, Continue with Foam roll and soft tissue work,    PT Home Exercise Plan self mobilisation thoracic flexibility   Consulted and Agree with Plan of Care Patient        Problem List Patient Active Problem List   Diagnosis Date Noted  . Anxiety and depression 01/10/2014  . Von Willebrand disease ([place]) 03/04/2013  . Breast cancer ([place]) 11/05/2012  . Gait disorder 08/09/2012  . Thoracic spine pain 08/09/2012  . Myofascial pain 08/09/2012  . Tremor 08/09/2012  . Cervical spine pain 08/09/2012  . Insomnia 06/11/2012  . Chronic pain of multiple sites 06/11/2012  .  Coordination abnormal 06/11/2012  . Abnormal weight gain 06/11/2012  . URI 10/10/2009  . INSOMNIA 10/10/2009  . GASTROENTERITIS 10/20/2008  . OTHER MALAISE AND FATIGUE 07/03/2008  . GERD 06/30/2008  . ABDOMINAL PAIN 06/30/2008  . DEPRESSION 06/26/2008  . ALLERGIC RHINITIS 06/26/2008  . VILLONODULAR SYNOVITIS, HAND 06/26/2008  . WRIST PAIN, LEFT 06/26/2008  . Personal history of malignant neoplasm of breast 06/26/2008    NAUMANN-HOUEGNIFIO,Princess Karnes PTA 03/05/2015, 5:14 PM  [facility] [address], [place], [place],  [address] Phone: [phone]   Fax:  [phone]  Name: Jessica Horn MRN: [id number] Date of Birth: 02-12-1981

## 2015-03-05 NOTE — Patient Instructions (Signed)
Thoracic Self-Mobilization (Sitting)   With small rolled towel at ribs (bra area - not shown in picture.)  , gently lean back until stretch is felt. Hold _10___ seconds or use variation in and out.. Relax. Repeat _3 with long hold  And x 10 with short repetitions.  Do _3___ sets per session. Do _2-3 sessions per day.  http://orth.exer.us/998   Copyright  VHI. All rights reserved.  Thoracic Self-Mobilization Stretch (Supine)   With towel roll  In BRA area,  lie back until stretch is felt. Hold  2 or 3x  Relax. Repeat __1__ times per set. Do  1 session. Do 1-2____ sessions per day.  http://orth.exer.us/994   Copyright  VHI. All rights reserved.

## 2015-03-13 ENCOUNTER — Ambulatory Visit: Payer: BLUE CROSS/BLUE SHIELD | Attending: Psychology | Admitting: Physical Therapy

## 2015-03-13 ENCOUNTER — Encounter: Payer: Self-pay | Admitting: Physical Therapy

## 2015-03-13 DIAGNOSIS — M546 Pain in thoracic spine: Secondary | ICD-10-CM | POA: Diagnosis present

## 2015-03-13 DIAGNOSIS — F410 Panic disorder [episodic paroxysmal anxiety] without agoraphobia: Secondary | ICD-10-CM | POA: Diagnosis present

## 2015-03-13 DIAGNOSIS — F321 Major depressive disorder, single episode, moderate: Secondary | ICD-10-CM

## 2015-03-13 DIAGNOSIS — F339 Major depressive disorder, recurrent, unspecified: Secondary | ICD-10-CM

## 2015-03-13 DIAGNOSIS — F4310 Post-traumatic stress disorder, unspecified: Secondary | ICD-10-CM | POA: Diagnosis present

## 2015-03-13 NOTE — Therapy (Signed)
[facility] [address] [place], [place], [address] Phone: [phone]   Fax:  [phone]  Physical Therapy Treatment  Patient Details  Name: Jessica Horn MRN: [id number] Date of Birth: 02/26/1981 Referring Provider: London Pepper  Encounter Date: 03/13/2015      PT End of Session - 03/13/15 1258    Visit Number 4   Date for PT Re-Evaluation 04/09/15   PT Start Time 1236   PT Stop Time 1306   PT Time Calculation (min) 30 min   Activity Tolerance Patient limited by pain   Behavior During Therapy [facility] for tasks assessed/performed      Past Medical History  Diagnosis Date  . Fibromyalgia   . Back pain   . Ovarian cyst   . Cancer ([place])     breast CA at 34 y/o  . Cancer of spine ([place])     spine CA for past 10 years  . Breast cancer ([place])   . Depression     Past Surgical History  Procedure Laterality Date  . Tubal ligation    . Cesarean section    . Ovarian cyst removal      There were no vitals filed for this visit.  Visit Diagnosis:  Midline thoracic back pain  Major depressive disorder, recurrent episode with anxious distress (HCC)  Posttraumatic stress disorder  Panic attacks  Major depressive disorder, single episode, moderate with anxious distress (HCC)      Subjective Assessment - 03/13/15 1253    Subjective Pt slipped in her bathtup this morning and rates her pain as 8/10. Before the fall she noticed improvement with her back pain.   Currently in Pain? Yes   Pain Score 8    Pain Location Thoracic   Pain Orientation Mid   Pain Descriptors / Indicators Sharp;Stabbing   Pain Type Chronic pain   Pain Onset More than a month ago   Pain Frequency Constant   Multiple Pain Sites No                         [facility] Adult PT Treatment/Exercise - 03/13/15 0001    Bed Mobility   Bed Mobility --  no exercises today due to incrase of pain due to fall.    Modalities   Modalities Electrical Stimulation;Moist Heat   Moist Heat Therapy   Number Minutes Moist Heat 25 Minutes   Moist Heat Location Lumbar Spine   Electrical Stimulation   Electrical Stimulation Location thoracic supine   Electrical Stimulation Action IFC   Electrical Stimulation Parameters to patients tolerance   Electrical Stimulation Goals Pain                  PT Short Term Goals - 03/13/15 1302    PT SHORT TERM GOAL #1   Title Independent with initial HEP   Time 3   Period Weeks   Status On-going   PT SHORT TERM GOAL #2   Title pain with activities decreased >/= 25%   Time 3   Period Weeks   Status On-going   PT SHORT TERM GOAL #3   Title understand correct posture during activities   Time 3   Period Weeks   Status On-going           PT Long Term Goals - 03/05/15 1712    PT LONG TERM GOAL #1   Title independent with HEP   Time 6   Period Weeks  Status On-going   PT LONG TERM GOAL #2   Title lift her daughter with pain decreased by 75%   Time 6   Period Weeks   Status On-going   PT LONG TERM GOAL #3   Title return to running with pain decreased >/= 75%   Time 6   Period Weeks   Status On-going   PT LONG TERM GOAL #4   Title improved thoracic movement making it easier to perfrom daily activities >/= 75%   Time 6   Period Weeks   Status On-going               Plan - 03/13/15 1259    Clinical Impression Statement Pt with incr discomfort today due to fall this am, but will benefit from modalities today. No open wound of bruises in low back as of now. Pt  rated her pain after modalities as a 6/10 before 8/10.Marland Kitchen Continue with gentle exercises if pt is feeling better.   Pt will benefit from skilled therapeutic intervention in order to improve on the following deficits Decreased activity tolerance;Decreased mobility;Decreased strength;Hypomobility;Impaired flexibility;Pain;Increased muscle spasms;Decreased endurance;Decreased range of motion    Rehab Potential Excellent   PT Frequency 2x / week   PT Duration 6 weeks   PT Treatment/Interventions Cryotherapy;Electrical Stimulation;Moist Heat;Therapeutic exercise;Therapeutic activities;Neuromuscular re-education;Patient/family education;Manual techniques;Passive range of motion   PT Next Visit Plan See how patient is recovering from fall in bathtub.    PT Home Exercise Plan self mobilisation thoracic flexibility   Consulted and Agree with Plan of Care Patient        Problem List Patient Active Problem List   Diagnosis Date Noted  . Anxiety and depression 01/10/2014  . Von Willebrand disease ([place]) 03/04/2013  . Breast cancer ([place]) 11/05/2012  . Gait disorder 08/09/2012  . Thoracic spine pain 08/09/2012  . Myofascial pain 08/09/2012  . Tremor 08/09/2012  . Cervical spine pain 08/09/2012  . Insomnia 06/11/2012  . Chronic pain of multiple sites 06/11/2012  . Coordination abnormal 06/11/2012  . Abnormal weight gain 06/11/2012  . URI 10/10/2009  . INSOMNIA 10/10/2009  . GASTROENTERITIS 10/20/2008  . OTHER MALAISE AND FATIGUE 07/03/2008  . GERD 06/30/2008  . ABDOMINAL PAIN 06/30/2008  . DEPRESSION 06/26/2008  . ALLERGIC RHINITIS 06/26/2008  . VILLONODULAR SYNOVITIS, HAND 06/26/2008  . WRIST PAIN, LEFT 06/26/2008  . Personal history of malignant neoplasm of breast 06/26/2008    NAUMANN-HOUEGNIFIO,Langston [place] PTA 03/13/2015, 1:24 PM  [facility] [address], [place], [place], [address] Phone: [phone]   Fax:  [phone]  Name: Jessica Horn MRN: [id number] Date of Birth: 1980-11-11

## 2015-03-22 ENCOUNTER — Ambulatory Visit: Payer: BLUE CROSS/BLUE SHIELD | Admitting: Physical Therapy

## 2015-03-22 ENCOUNTER — Encounter: Payer: Self-pay | Admitting: Physical Therapy

## 2015-03-22 DIAGNOSIS — M546 Pain in thoracic spine: Secondary | ICD-10-CM

## 2015-03-22 NOTE — Therapy (Signed)
[facility] [address] [place], [place], [address] Phone: [phone]   Fax:  [phone]  Physical Therapy Treatment  Patient Details  Name: Jessica Horn MRN: [id number] Date of Birth: 01-17-1981 Referring Provider: Farris Has  Encounter Date: 03/22/2015      PT End of Session - 03/22/15 1236    Visit Number 5   Date for PT Re-Evaluation 04/09/15   PT Start Time 1230   PT Stop Time 1315   PT Time Calculation (min) 45 min   Activity Tolerance Patient limited by pain   Behavior During Therapy [facility] for tasks assessed/performed      Past Medical History  Diagnosis Date  . Fibromyalgia   . Back pain   . Ovarian cyst   . Cancer (HCC)     breast CA at 34 y/o  . Cancer of spine (HCC)     spine CA for past 10 years  . Breast cancer (HCC)   . Depression     Past Surgical History  Procedure Laterality Date  . Tubal ligation    . Cesarean section    . Ovarian cyst removal      There were no vitals filed for this visit.  Visit Diagnosis:  Midline thoracic back pain      Subjective Assessment - 03/22/15 1233    Subjective I am still hurting from falling in bathtub.  Unable to go to doctor due to grandfather funeral in [place].    Limitations Sitting;Standing;Walking   How long can you sit comfortably? not all   How long can you stand comfortably? 5 min.    How long can you walk comfortably? not comfortable   Patient Stated Goals get mobility back, lift child and move without pain   Currently in Pain? Yes   Pain Score 7    Pain Location Thoracic   Pain Orientation Mid   Pain Descriptors / Indicators Sharp;Stabbing   Pain Type Chronic pain   Pain Onset More than a month ago   Aggravating Factors  lifting, reaching for something, standing, movement, sitting   Pain Relieving Factors heat    Multiple Pain Sites No                         [facility] Adult PT Treatment/Exercise -  03/22/15 0001    Modalities   Modalities Electrical Stimulation;Moist Heat   Moist Heat Therapy   Number Minutes Moist Heat 20 Minutes   Moist Heat Location Lumbar Spine   Electrical Stimulation   Electrical Stimulation Location thoracic supine   Electrical Stimulation Action IFC   Electrical Stimulation Parameters to patient tolerance, 20 min   Electrical Stimulation Goals Pain   Manual Therapy   Manual Therapy Soft tissue mobilization;Joint mobilization   Manual therapy comments thoracic paraspinals, upper trap, levator scapulae, rhomboid   Joint Mobilization right posterior rib mobilization                PT Education - 03/22/15 1300    Education provided No          PT Short Term Goals - 03/22/15 1300    PT SHORT TERM GOAL #1   Title Independent with initial HEP   Time 3   Status Achieved   PT SHORT TERM GOAL #2   Title pain with activities decreased >/= 25%   Time 3   Period Weeks   Status On-going  met prior to fall  PT SHORT TERM GOAL #3   Title understand correct posture during activities   Time 3   Period Weeks   Status Achieved           PT Long Term Goals - 03/05/15 1712    PT LONG TERM GOAL #1   Title independent with HEP   Time 6   Period Weeks   Status On-going   PT LONG TERM GOAL #2   Title lift her daughter with pain decreased by 75%   Time 6   Period Weeks   Status On-going   PT LONG TERM GOAL #3   Title return to running with pain decreased >/= 75%   Time 6   Period Weeks   Status On-going   PT LONG TERM GOAL #4   Title improved thoracic movement making it easier to perfrom daily activities >/= 75%   Time 6   Period Weeks   Status On-going               Plan - 03/22/15 1302    Clinical Impression Statement Patient is a 34 year old female with diagnosis of Dorsalgia.  Patient fell last week and still having increased pain.  Patient has met STG # 1 and 3.  Patient had tightness in right posterior rib cage, bil.  upper trapezius and levator scapulae.   Patient would benefit from physical therapy to reduce pain and muscle tension in thoracic area.    Pt will benefit from skilled therapeutic intervention in order to improve on the following deficits Decreased activity tolerance;Decreased mobility;Decreased strength;Hypomobility;Impaired flexibility;Pain;Increased muscle spasms;Decreased endurance;Decreased range of motion   Rehab Potential Excellent   Clinical Impairments Affecting Rehab Potential None   PT Frequency 2x / week   PT Duration 6 weeks   PT Treatment/Interventions Cryotherapy;Electrical Stimulation;Moist Heat;Therapeutic exercise;Therapeutic activities;Neuromuscular re-education;Patient/family education;Manual techniques;Passive range of motion   PT Next Visit Plan ways to use foam roller, yoga moves   PT Home Exercise Plan self mobilisation thoracic flexibility   Consulted and Agree with Plan of Care Patient        Problem List Patient Active Problem List   Diagnosis Date Noted  . Anxiety and depression 01/10/2014  . Von Willebrand disease ([place]) 03/04/2013  . Breast cancer ([place]) 11/05/2012  . Gait disorder 08/09/2012  . Thoracic spine pain 08/09/2012  . Myofascial pain 08/09/2012  . Tremor 08/09/2012  . Cervical spine pain 08/09/2012  . Insomnia 06/11/2012  . Chronic pain of multiple sites 06/11/2012  . Coordination abnormal 06/11/2012  . Abnormal weight gain 06/11/2012  . URI 10/10/2009  . INSOMNIA 10/10/2009  . GASTROENTERITIS 10/20/2008  . OTHER MALAISE AND FATIGUE 07/03/2008  . GERD 06/30/2008  . ABDOMINAL PAIN 06/30/2008  . DEPRESSION 06/26/2008  . ALLERGIC RHINITIS 06/26/2008  . VILLONODULAR SYNOVITIS, HAND 06/26/2008  . WRIST PAIN, LEFT 06/26/2008  . Personal history of malignant neoplasm of breast 06/26/2008    Raphel Stickles,PT 03/22/2015, 1:08 PM  [facility] [address] [place], [place],  [address] Phone: [phone]   Fax:  [phone]  Name: Damiana Berrian MRN: [id number] Date of Birth: [birth year]

## 2015-03-29 ENCOUNTER — Encounter: Payer: BLUE CROSS/BLUE SHIELD | Admitting: Physical Therapy

## 2015-04-02 ENCOUNTER — Ambulatory Visit: Payer: BLUE CROSS/BLUE SHIELD | Admitting: Physical Therapy

## 2015-04-09 ENCOUNTER — Ambulatory Visit: Payer: BLUE CROSS/BLUE SHIELD | Admitting: Physical Therapy

## 2015-04-09 ENCOUNTER — Encounter: Payer: Self-pay | Admitting: Physical Therapy

## 2015-04-09 DIAGNOSIS — M546 Pain in thoracic spine: Secondary | ICD-10-CM | POA: Diagnosis not present

## 2015-04-09 DIAGNOSIS — F4310 Post-traumatic stress disorder, unspecified: Secondary | ICD-10-CM

## 2015-04-09 DIAGNOSIS — F339 Major depressive disorder, recurrent, unspecified: Secondary | ICD-10-CM

## 2015-04-09 DIAGNOSIS — F410 Panic disorder [episodic paroxysmal anxiety] without agoraphobia: Secondary | ICD-10-CM

## 2015-04-09 NOTE — Therapy (Signed)
[facility] [address], [place], [place], [address] Phone: [phone]   Fax:  [phone]  Physical Therapy Treatment  Patient Details  Name: Jessica Horn MRN: [id number] Date of Birth: [birth year] Referring Provider: London Pepper  Encounter Date: 04/09/2015      PT End of Session - 04/09/15 1316    Visit Number 6   PT Start Time [id number]   PT Stop Time 1320   PT Time Calculation (min) 50 min   Activity Tolerance Patient limited by pain   Behavior During Therapy [facility] for tasks assessed/performed      Past Medical History  Diagnosis Date  . Fibromyalgia   . Back pain   . Ovarian cyst   . Cancer ([place])     breast CA at 34 y/o  . Cancer of spine ([place])     spine CA for past 10 years  . Breast cancer ([place])   . Depression     Past Surgical History  Procedure Laterality Date  . Tubal ligation    . Cesarean section    . Ovarian cyst removal      There were no vitals filed for this visit.  Visit Diagnosis:  Midline thoracic back pain  Major depressive disorder, recurrent episode with anxious distress ([place])  Posttraumatic stress disorder  Panic attacks      Subjective Assessment - 04/09/15 1312    Subjective Pt still hurting from her fall in the bathtu on Nov 1st.  Pt is limited with her sitting tolerance for her work and with difficulties to concentrate due to pain   Currently in Pain? Yes   Pain Score 8    Pain Location Thoracic   Pain Orientation Mid;Upper;Right;Left  right more than left ribcage area   Pain Descriptors / Indicators Sharp;Stabbing   Pain Type Chronic pain   Pain Onset More than a month ago   Pain Frequency Constant   Multiple Pain Sites No                         [facility] Adult PT Treatment/Exercise - 04/09/15 0001    Posture/Postural Control   Posture/Postural Control Postural limitations   Postural Limitations Rounded Shoulders;Forward head;Decreased  lumbar lordosis   Posture Comments rib hump on the right .    Exercises   Exercises Lumbar;Shoulder   Lumbar Exercises: Prone   Other Prone Lumbar Exercises Cobra position x 1 min x 1  pt able to toleraet after STW   Other Prone Lumbar Exercises McKenzie extension x 10 in prone  pt able to perform after STW   Modalities   Modalities Electrical Stimulation;Moist Heat   Moist Heat Therapy   Number Minutes Moist Heat 20 Minutes   Moist Heat Location Lumbar Spine   Electrical Stimulation   Electrical Stimulation Location thoracic supine   Electrical Stimulation Action IFC   Electrical Stimulation Parameters to patients tolerance   Electrical Stimulation Goals Pain   Manual Therapy   Manual Therapy Soft tissue mobilization;Taping  scapular mobilzsation   Manual therapy comments thoracic paraspinals, upper trap, levator scapulae, rhomboid                  PT Short Term Goals - 04/09/15 1319    PT SHORT TERM GOAL #1   Title Independent with initial HEP   Time 3   Period Weeks   Status Achieved   PT SHORT TERM GOAL #2   Title pain with  activities decreased >/= 25%   Time 3   Period Weeks   Status On-going   PT SHORT TERM GOAL #3   Title understand correct posture during activities   Time 3   Period Weeks   Status Achieved           PT Long Term Goals - 03/05/15 1712    PT LONG TERM GOAL #1   Title independent with HEP   Time 6   Period Weeks   Status On-going   PT LONG TERM GOAL #2   Title lift her daughter with pain decreased by 75%   Time 6   Period Weeks   Status On-going   PT LONG TERM GOAL #3   Title return to running with pain decreased >/= 75%   Time 6   Period Weeks   Status On-going   PT LONG TERM GOAL #4   Title improved thoracic movement making it easier to perfrom daily activities >/= 75%   Time 6   Period Weeks   Status On-going               Plan - 04/09/15 1316    Clinical Impression Statement Patient is a 34year old  female with diagnosis of Dorsalgia, Patient fell on Nov 1st and haviing increased pain and walking with trunk flexion. Pt with incr tighness on right posterior rib cage, bil upper trapezius and levator scapulae. pt will continue to benefit from skille PT rto reduce pain and muscle tension in thoracic area.   Pt will benefit from skilled therapeutic intervention in order to improve on the following deficits Decreased activity tolerance;Decreased mobility;Decreased strength;Hypomobility;Impaired flexibility;Pain;Increased muscle spasms;Decreased endurance;Decreased range of motion   Rehab Potential Excellent   PT Frequency 2x / week   PT Duration 6 weeks   PT Treatment/Interventions Cryotherapy;Electrical Stimulation;Moist Heat;Therapeutic exercise;Therapeutic activities;Neuromuscular re-education;Patient/family education;Manual techniques;Passive range of motion   PT Next Visit Plan May try activities on faom roll if feeling better   PT Home Exercise Plan self mobilisation thoracic flexibility   Consulted and Agree with Plan of Care Patient        Problem List Patient Active Problem List   Diagnosis Date Noted  . Anxiety and depression 01/10/2014  . Von Willebrand disease ([place]) 03/04/2013  . Breast cancer ([place]) 11/05/2012  . Gait disorder 08/09/2012  . Thoracic spine pain 08/09/2012  . Myofascial pain 08/09/2012  . Tremor 08/09/2012  . Cervical spine pain 08/09/2012  . Insomnia 06/11/2012  . Chronic pain of multiple sites 06/11/2012  . Coordination abnormal 06/11/2012  . Abnormal weight gain 06/11/2012  . URI 10/10/2009  . INSOMNIA 10/10/2009  . GASTROENTERITIS 10/20/2008  . OTHER MALAISE AND FATIGUE 07/03/2008  . GERD 06/30/2008  . ABDOMINAL PAIN 06/30/2008  . DEPRESSION 06/26/2008  . ALLERGIC RHINITIS 06/26/2008  . VILLONODULAR SYNOVITIS, HAND 06/26/2008  . WRIST PAIN, LEFT 06/26/2008  . Personal history of malignant neoplasm of breast 06/26/2008    NAUMANN-HOUEGNIFIO,Tennyson Horn  PTA 04/09/2015, 1:21 PM  [facility] [address] [place], [place], [address] Phone: [phone]   Fax:  [phone]  Name: Jessica Horn MRN: [id number] Date of Birth: 1981/05/11

## 2015-04-11 ENCOUNTER — Encounter: Payer: BLUE CROSS/BLUE SHIELD | Admitting: Physical Therapy

## 2015-04-11 ENCOUNTER — Telehealth: Payer: Self-pay

## 2015-04-11 NOTE — Telephone Encounter (Signed)
Patient called in and cancel appt due her daughter is being rush to the hospital from school.

## 2015-04-16 ENCOUNTER — Ambulatory Visit: Payer: BLUE CROSS/BLUE SHIELD | Attending: Psychology | Admitting: Physical Therapy

## 2015-04-16 ENCOUNTER — Encounter: Payer: Self-pay | Admitting: Physical Therapy

## 2015-04-16 DIAGNOSIS — F410 Panic disorder [episodic paroxysmal anxiety] without agoraphobia: Secondary | ICD-10-CM | POA: Diagnosis present

## 2015-04-16 DIAGNOSIS — F321 Major depressive disorder, single episode, moderate: Secondary | ICD-10-CM | POA: Insufficient documentation

## 2015-04-16 DIAGNOSIS — F339 Major depressive disorder, recurrent, unspecified: Secondary | ICD-10-CM | POA: Insufficient documentation

## 2015-04-16 DIAGNOSIS — F4310 Post-traumatic stress disorder, unspecified: Secondary | ICD-10-CM | POA: Insufficient documentation

## 2015-04-16 DIAGNOSIS — M546 Pain in thoracic spine: Secondary | ICD-10-CM | POA: Diagnosis present

## 2015-04-16 NOTE — Therapy (Signed)
[facility] [address] [place], [place], [address] Phone: [phone]   Fax:  [phone]  Physical Therapy Treatment  Patient Details  Name: Jessica Horn MRN: [id number] Date of Birth: [birth year] Referring Provider: Horton Marshall  Encounter Date: 04/16/2015      PT End of Session - 04/16/15 [id number]    Visit Number 7   Date for PT Re-Evaluation 06/13/15   PT Start Time 1230   PT Stop Time 1320   PT Time Calculation (min) 50 min   Activity Tolerance Patient limited by pain   Behavior During Therapy [facility] for tasks assessed/performed      Past Medical History  Diagnosis Date  . Fibromyalgia   . Back pain   . Ovarian cyst   . Cancer ([place])     breast CA at 34 y/o  . Cancer of spine ([place])     spine CA for past 10 years  . Breast cancer ([place])   . Depression     Past Surgical History  Procedure Laterality Date  . Tubal ligation    . Cesarean section    . Ovarian cyst removal      There were no vitals filed for this visit.  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 Pt had epidural on Thursday 1st of December, today her pain is rated as 7/10, what is slighly better per patient's statement.    Currently in Pain? Yes   Pain Score 7    Pain Location Thoracic   Pain Orientation Right;Left;Upper;Mid   Pain Descriptors / Indicators Sharp;Stabbing   Pain Type Chronic pain   Pain Onset More than a month ago   Multiple Pain Sites No            [facility] PT Assessment - 04/16/15 0001    Assessment   Medical Diagnosis M54.9 back pain   Referring Provider Horton Marshall   Onset Date/Surgical Date 11/10/14   Prior Therapy None   Precautions   Precautions Other  (comment)   Precaution Comments No ultrasound   Balance Screen   Has the patient fallen in the past 6 months Yes  on 03/13/15 slipping in the bathtub, & in the past in kitchen   How many times? 3   Has the patient had a decrease in activity level because of a fear of falling?  No   Is the patient reluctant to leave their home because of a fear of falling?  No   Prior Function   Level of Independence Independent   Vocation Full time employment   Cognition   Overall Cognitive Status Within Functional Limits for tasks assessed   Observation/Other Assessments   Focus on Therapeutic Outcomes (FOTO)  62% limitation CL  as of 02/26/15   AROM   Overall AROM Comments full bil. shoulder ROM   Thoracic Flexion decreased by 10%   Thoracic Extension decreased by 10%   Thoracic - Right Side Bend decreased by 10%   Thoracic - Left Side Bend decreased by 10%   Thoracic - Right Rotation decreased by 50%   Thoracic - Left Rotation decreased by 25%  [facility] Adult PT Treatment/Exercise - 04/16/15 0001    Posture/Postural Control   Posture/Postural Control Postural limitations   Postural Limitations Rounded Shoulders;Forward head;Decreased lumbar lordosis   Posture Comments rib hump on the right .    Exercises   Exercises Lumbar;Shoulder   Lumbar Exercises: Standing   Other Standing Lumbar Exercises McKenzie extension x 10   Lumbar Exercises: Seated   Other Seated Lumbar Exercises cat and camel in sitting 2 x 10    Lumbar Exercises: Prone   Other Prone Lumbar Exercises Cobra position x 1 min  pt able to perform before STW   Other Prone Lumbar Exercises McKenzie extension x 10 in prone  today able to perform before STW   Lumbar Exercises: Quadruped   Madcat/Old Horse 10 reps  pt able to perform with some discomfort in thoracic spine   Shoulder Exercises: Supine   Other Supine Exercises decompression & elongation onr foam roll x 3 min,    Modalities   Modalities  Electrical Stimulation;Moist Heat   Moist Heat Therapy   Number Minutes Moist Heat 15 Minutes   Moist Heat Location Lumbar Spine  and thoracic   Electrical Stimulation   Electrical Stimulation Location thoracic supine   Electrical Stimulation Action IFC   Electrical Stimulation Parameters to patients toleracne   Electrical Stimulation Goals Pain   Manual Therapy   Manual Therapy Soft tissue mobilization   Manual therapy comments thoracic paraspinals, upper trap, levator scapulae, rhomboid                  PT Short Term Goals - 04/16/15 1247    PT SHORT TERM GOAL #1   Title Independent with initial HEP   Time 3   Period Weeks   Status Achieved   PT SHORT TERM GOAL #2   Title pain with activities decreased >/= 25%   Time 3   Period Weeks   Status On-going   PT SHORT TERM GOAL #3   Title understand correct posture during activities   Time 3   Period Weeks   Status Achieved           PT Long Term Goals - 03/05/15 1712    PT LONG TERM GOAL #1   Title independent with HEP   Time 6   Period Weeks   Status On-going   PT LONG TERM GOAL #2   Title lift her daughter with pain decreased by 75%   Time 6   Period Weeks   Status On-going   PT LONG TERM GOAL #3   Title return to running with pain decreased >/= 75%   Time 6   Period Weeks   Status On-going   PT LONG TERM GOAL #4   Title improved thoracic movement making it easier to perfrom daily activities >/= 75%   Time 6   Period Weeks   Status On-going               Plan - 04/16/15 1242    Clinical Impression Statement patient is a 34 year old female with diagnosis of dorsalgia, patient had epidural last week, and expieriences slighly improvemnt. Pt will continue to benefit from skilled Pt to reduce pain and muscular tension in thoracic area. Patient has not een consistent with treatments due to personal time conflict, death in the family, and falling 3 times.  Patient reports she is 40% improved,  improved concentration, and improved extended sitting tolerance.    Pt will benefit from skilled therapeutic intervention  in order to improve on the following deficits Decreased activity tolerance;Decreased mobility;Decreased strength;Hypomobility;Impaired flexibility;Pain;Increased muscle spasms;Decreased endurance;Decreased range of motion   Rehab Potential Excellent   PT Frequency 2x / week   PT Duration 6 weeks   PT Treatment/Interventions Cryotherapy;Electrical Stimulation;Moist Heat;Therapeutic exercise;Therapeutic activities;Neuromuscular re-education;Patient/family education;Manual techniques;Passive range of motion   PT Next Visit Plan Continue withfoam roll and gentle stretching as cat and camel and McKenzie extension   PT Home Exercise Plan self mobilisation thoracic flexibility   Consulted and Agree with Plan of Care Patient        Problem List Patient Active Problem List   Diagnosis Date Noted  . Anxiety and depression 01/10/2014  . Von Willebrand disease ([place]) 03/04/2013  . Breast cancer ([place]) 11/05/2012  . Gait disorder 08/09/2012  . Thoracic spine pain 08/09/2012  . Myofascial pain 08/09/2012  . Tremor 08/09/2012  . Cervical spine pain 08/09/2012  . Insomnia 06/11/2012  . Chronic pain of multiple sites 06/11/2012  . Coordination abnormal 06/11/2012  . Abnormal weight gain 06/11/2012  . URI 10/10/2009  . INSOMNIA 10/10/2009  . GASTROENTERITIS 10/20/2008  . OTHER MALAISE AND FATIGUE 07/03/2008  . GERD 06/30/2008  . ABDOMINAL PAIN 06/30/2008  . DEPRESSION 06/26/2008  . ALLERGIC RHINITIS 06/26/2008  . VILLONODULAR SYNOVITIS, HAND 06/26/2008  . WRIST PAIN, LEFT 06/26/2008  . Personal history of malignant neoplasm of breast 06/26/2008    Xerxes Agrusa Naumann-Houegnifio, PTA 04/16/2015 1:57 PM  Earlie Counts, PT 04/16/2015 1:57 PM   04/16/2015, 1:57 PM   [facility] [address] [place], [place],  [address] Phone: [phone]   Fax:  [phone]  Name: Jessica Horn MRN: [id number] Date of Birth: [birth year]

## 2015-04-18 ENCOUNTER — Encounter: Payer: BLUE CROSS/BLUE SHIELD | Admitting: Physical Therapy

## 2015-04-23 ENCOUNTER — Ambulatory Visit: Payer: BLUE CROSS/BLUE SHIELD | Admitting: Physical Therapy

## 2015-04-23 ENCOUNTER — Encounter: Payer: Self-pay | Admitting: Physical Therapy

## 2015-04-23 DIAGNOSIS — M546 Pain in thoracic spine: Secondary | ICD-10-CM

## 2015-04-23 DIAGNOSIS — F410 Panic disorder [episodic paroxysmal anxiety] without agoraphobia: Secondary | ICD-10-CM

## 2015-04-23 DIAGNOSIS — F339 Major depressive disorder, recurrent, unspecified: Secondary | ICD-10-CM

## 2015-04-23 DIAGNOSIS — F4310 Post-traumatic stress disorder, unspecified: Secondary | ICD-10-CM

## 2015-04-23 DIAGNOSIS — F321 Major depressive disorder, single episode, moderate: Secondary | ICD-10-CM

## 2015-04-23 NOTE — Patient Instructions (Signed)
http://orth.exer.us/918   PNF Strengthening: Resisted    Supine, sitting or standing with resistive band around each hand, bring right arm up (thumb facing back) and left arm down (thumb forward) Repeat __5__ times per set. Do __2__ sets per session. Do 2-3  sessions per day.  http://orth.exer.us/918   Copyright  VHI. All rights reserved.  Strengthening: Chest Pull - Resisted    Perform in supine, sitting or standing. With resistive band looped around each hand, and arms straight out in front, stretch band across chest. Repeat  10____ times per set. Do _2___ sets per session. Do __2-3__ sessions per day.  http://orth.exer.us/926   Copyright  VHI. All rights reserved.

## 2015-04-23 NOTE — Therapy (Addendum)
[facility] [address], [address] [place], [place], [address] Phone: [phone]   Fax:  [phone]  Physical Therapy Treatment  Patient Details  Name: Jessica Horn MRN: [id number] Date of Birth: [birth year] Referring Provider: Cathren Harsh  Encounter Date: 04/23/2015      PT End of Session - 04/23/15 1354    Visit Number 8   Date for PT Re-Evaluation 06/13/15   PT Start Time 1229   PT Stop Time 1320   PT Time Calculation (min) 51 min   Activity Tolerance Patient tolerated treatment well   Behavior During Therapy [facility] for tasks assessed/performed      Past Medical History  Diagnosis Date  . Fibromyalgia   . Back pain   . Ovarian cyst   . Cancer ([facility])     breast CA at 34 y/o  . Cancer of spine ([facility])     spine CA for past 10 years  . Breast cancer ([facility])   . Depression     Past Surgical History  Procedure Laterality Date  . Tubal ligation    . Cesarean section    . Ovarian cyst removal      There were no vitals filed for this visit.  Visit Diagnosis:  Midline thoracic back pain  Major depressive disorder, recurrent episode with anxious distress ([facility])  Posttraumatic stress disorder  Panic attacks  Major depressive disorder, single episode, moderate with anxious distress ([facility])      Subjective Assessment - 04/23/15 1238    Subjective Pt rates her pain as 7/10 what she states feels better than usually.    Currently in Pain? Yes   Pain Score 7    Pain Location Thoracic   Pain Orientation Right;Left;Upper;Mid   Pain Descriptors / Indicators Sharp;Stabbing   Pain Type Chronic pain   Pain Onset More than a month ago   Pain Frequency Constant   Aggravating Factors  lifting, reaching for something, standing movement, sitting   Pain Relieving Factors heat   Multiple Pain Sites No                         [facility] Adult PT Treatment/Exercise - 04/23/15 0001    Posture/Postural  Control   Posture/Postural Control Postural limitations   Postural Limitations Rounded Shoulders;Forward head;Decreased lumbar lordosis   Posture Comments rib hump on the right .    Exercises   Exercises Lumbar;Shoulder   Lumbar Exercises: Standing   Row Strengthening  with 4# weights, withs abdominal bracing   Other Standing Lumbar Exercises McKenzie extension x 10   Other Standing Lumbar Exercises Biceps curls with 4# 2 x30  with focus on abdominal bracing   Lumbar Exercises: Seated   Other Seated Lumbar Exercises cat and camel in sitting 2 x 10    Lumbar Exercises: Supine   Other Supine Lumbar Exercises PNF D2 with red t-band, 2  x 5 each side   Other Supine Lumbar Exercises chest pull with red T-band 2 x 10    Lumbar Exercises: Prone   Other Prone Lumbar Exercises Cobra position x 1 min   Other Prone Lumbar Exercises McKenzie extension x 10 in prone   Lumbar Exercises: Quadruped   Madcat/Old Horse 10 reps  pt needed demonstartion & tactile cues for technique   Modalities   Modalities Electrical Stimulation;Moist Heat   Moist Heat Therapy   Number Minutes Moist Heat 10 Minutes  only 10 due to pt time restrictions  Moist Heat Location Lumbar Spine   Electrical Stimulation   Electrical Stimulation Location thoracic supine   Electrical Stimulation Action IFC   Electrical Stimulation Parameters topatients tolerance   Electrical Stimulation Goals Pain   Manual Therapy   Manual Therapy Soft tissue mobilization   Manual therapy comments thoracic paraspinals, upper trap, levator scapulae, rhomboid                PT Education - 04/23/15 1353    Education provided Yes   Education Details PNF D2 with red T-band, chestpull (bil  ABD)   Person(s) Educated Patient   Methods Explanation;Handout;Demonstration   Comprehension Returned demonstration;Verbalized understanding          PT Short Term Goals - 04/23/15 1357    PT SHORT TERM GOAL #1   Title Independent with  initial HEP   Time 3   Period Weeks   Status Achieved   PT SHORT TERM GOAL #2   Title pain with activities decreased >/= 25%   Time 3   Period Weeks   Status On-going   PT SHORT TERM GOAL #3   Title understand correct posture during activities   Time 3   Period Weeks   Status Achieved           PT Long Term Goals - 03/05/15 1712    PT LONG TERM GOAL #1   Title independent with HEP   Time 6   Period Weeks   Status On-going   PT LONG TERM GOAL #2   Title lift her daughter with pain decreased by 75%   Time 6   Period Weeks   Status On-going   PT LONG TERM GOAL #3   Title return to running with pain decreased >/= 75%   Time 6   Period Weeks   Status On-going   PT LONG TERM GOAL #4   Title improved thoracic movement making it easier to perfrom daily activities >/= 75%   Time 6   Period Weeks   Status On-going               Plan - 04/23/15 1249    Clinical Impression Statement Pt was able to tolerate incr activity level at PT clinic, due to feeling better. Pt will continue to benefit from skilled PT to improve postural strength, and increase activity level.    Pt will benefit from skilled therapeutic intervention in order to improve on the following deficits Decreased activity tolerance;Decreased mobility;Decreased strength;Hypomobility;Impaired flexibility;Pain;Increased muscle spasms;Decreased endurance;Decreased range of motion   Rehab Potential Excellent   PT Frequency 2x / week   PT Duration 6 weeks   PT Treatment/Interventions Cryotherapy;Electrical Stimulation;Moist Heat;Therapeutic exercise;Therapeutic activities;Neuromuscular re-education;Patient/family education;Manual techniques;Passive range of motion   PT Next Visit Plan Continue with T-Band activities, foam roll and gentle stretching as cat and camel and McKenzie extension   PT Home Exercise Plan review new t-band exercises PNF D2 and chest pull (ABD)   Consulted and Agree with Plan of Care Patient         Problem List Patient Active Problem List   Diagnosis Date Noted  . Anxiety and depression 01/10/2014  . Von Willebrand disease ([place]) 03/04/2013  . Breast cancer ([place]) 11/05/2012  . Gait disorder 08/09/2012  . Thoracic spine pain 08/09/2012  . Myofascial pain 08/09/2012  . Tremor 08/09/2012  . Cervical spine pain 08/09/2012  . Insomnia 06/11/2012  . Chronic pain of multiple sites 06/11/2012  . Coordination abnormal 06/11/2012  . Abnormal weight gain  06/11/2012  . URI 10/10/2009  . INSOMNIA 10/10/2009  . GASTROENTERITIS 10/20/2008  . OTHER MALAISE AND FATIGUE 07/03/2008  . GERD 06/30/2008  . ABDOMINAL PAIN 06/30/2008  . DEPRESSION 06/26/2008  . ALLERGIC RHINITIS 06/26/2008  . VILLONODULAR SYNOVITIS, HAND 06/26/2008  . WRIST PAIN, LEFT 06/26/2008  . Personal history of malignant neoplasm of breast 06/26/2008    NAUMANN-HOUEGNIFIO,Satoria Dunlop PTA 04/23/2015, 2:06 PM  [facility] [address] [place], [place], [address] Phone: [phone]   Fax:  [phone]  Name: Jessica Horn MRN: [id number] Date of Birth: January 19, 1981    PHYSICAL THERAPY DISCHARGE SUMMARY  Visits from Start of Care: 8  Current functional level related to goals / functional outcomes: Unable to assess patient due to them not returning after last visit.  Secretary has called patient and left a message.  Patient did not return phone call.    Remaining deficits: See above.   Education / Equipment: HEP  Plan:                                                    Patient goals were not met. Patient is being discharged due to not returning since the last visit.  Thank you for the referral. Earlie Counts, PT 05/25/2015 10:09 AM  ?????

## 2015-04-25 ENCOUNTER — Encounter: Payer: BLUE CROSS/BLUE SHIELD | Admitting: Physical Therapy

## 2015-05-01 ENCOUNTER — Other Ambulatory Visit: Payer: Self-pay | Admitting: Family Medicine

## 2015-05-01 DIAGNOSIS — R42 Dizziness and giddiness: Secondary | ICD-10-CM

## 2015-05-01 DIAGNOSIS — R296 Repeated falls: Secondary | ICD-10-CM

## 2015-05-01 DIAGNOSIS — R519 Headache, unspecified: Secondary | ICD-10-CM

## 2015-05-01 DIAGNOSIS — R51 Headache: Secondary | ICD-10-CM

## 2015-05-01 DIAGNOSIS — M549 Dorsalgia, unspecified: Secondary | ICD-10-CM

## 2015-05-11 ENCOUNTER — Ambulatory Visit
Admission: RE | Admit: 2015-05-11 | Discharge: 2015-05-11 | Disposition: A | Discharge status: Home / Self Care | Payer: BLUE CROSS/BLUE SHIELD | Source: Ambulatory Visit | Attending: Family Medicine | Admitting: Family Medicine

## 2015-05-11 DIAGNOSIS — M549 Dorsalgia, unspecified: Secondary | ICD-10-CM

## 2015-05-11 DIAGNOSIS — R51 Headache: Secondary | ICD-10-CM

## 2015-05-11 DIAGNOSIS — R42 Dizziness and giddiness: Secondary | ICD-10-CM

## 2015-05-11 DIAGNOSIS — R519 Headache, unspecified: Secondary | ICD-10-CM

## 2015-05-11 DIAGNOSIS — R296 Repeated falls: Secondary | ICD-10-CM

## 2015-05-11 MED ORDER — GADOBENATE DIMEGLUMINE 529 MG/ML IV SOLN
14.0000 mL | Freq: Once | INTRAVENOUS | Status: AC | PRN
  Administered 2015-05-11: 14 mL via INTRAVENOUS

## 2015-06-04 ENCOUNTER — Ambulatory Visit (INDEPENDENT_AMBULATORY_CARE_PROVIDER_SITE_OTHER): Payer: BLUE CROSS/BLUE SHIELD | Admitting: Neurology

## 2015-06-04 ENCOUNTER — Encounter: Payer: Self-pay | Admitting: Neurology

## 2015-06-04 VITALS — BP 124/76 | HR 81 | Ht 63.0 in | Wt 151.0 lb

## 2015-06-04 DIAGNOSIS — G4441 Drug-induced headache, not elsewhere classified, intractable: Secondary | ICD-10-CM

## 2015-06-04 DIAGNOSIS — R51 Headache: Secondary | ICD-10-CM

## 2015-06-04 DIAGNOSIS — R55 Syncope and collapse: Secondary | ICD-10-CM | POA: Diagnosis not present

## 2015-06-04 DIAGNOSIS — G44201 Tension-type headache, unspecified, intractable: Secondary | ICD-10-CM

## 2015-06-04 DIAGNOSIS — G4440 Drug-induced headache, not elsewhere classified, not intractable: Secondary | ICD-10-CM | POA: Insufficient documentation

## 2015-06-04 DIAGNOSIS — R519 Headache, unspecified: Secondary | ICD-10-CM | POA: Insufficient documentation

## 2015-06-04 MED ORDER — NAPROXEN 500 MG PO TABS: 500.0000 mg | ORAL_TABLET | Freq: Two times a day (BID) | ORAL | Status: DC | PRN

## 2015-06-04 MED ORDER — NORTRIPTYLINE HCL 25 MG PO CAPS: 25.0000 mg | ORAL_CAPSULE | Freq: Every day | ORAL | Status: DC

## 2015-06-04 NOTE — Progress Notes (Signed)
NEUROLOGY CONSULTATION NOTE  Jessica Horn MRN: [id number] DOB: 11/01/1980  Referring provider: Dr. Orland Mustard Primary care provider: Dr. Orland Mustard  Reason for consult:  Headache, falls  HISTORY OF PRESENT ILLNESS: Jessica Horn is a 35 year old right-handed female with Von Willebrand disease,  major depressive disorder, posttraumatic stress disorder, chronic pain and history of breast cancer who presents for frequent falls.  History obtained by patient and PCP note.  Labs and images of brain MRI reviewed.  She has a history of chronic back pain and has been taking Vicodin three times daily for the past 4 months.  About 2 months ago, she began experiencing headaches and frequent falls.  Headaches are bi-frontal, non-throbbing and 9.5/10 intensity.  It is not associated with nausea (although she has fairly constant nausea as well), photophobia or phonophobia.  They initially lasted 15 minutes but have progressed to 3 to 5 hours duration.  They occur daily.  Excedrin, Tylenol and ibuprofen are ineffective.  For the same period of time, she has had frequent falls.  She says she feels "disoriented" briefly just before and after the fall, but denies dizziness.  She says she just wakes up on the floor.  Her husband has reportedly witnessed them.  There is no associated convulsions, tongue biting or bowel/bladder incontinence.  It does not last too long and she is not postictal when she wakes up.  These events occur daily.  She also has been experiencing fairly constant nausea and dizziness.  She takes promethazine.  As a result, she reports poor appetite and often will only eat one meal a day.  She does not keep hydrated.  She has insomnia.  She exercises routinely.  She denies prior history of headache or seizures.   There is no family history of headache, but her father, paternal grandmother, maternal grandfather and some paternal cousins have history of brain tumor.  She had an MRI of the  brain with and without contrast performed on 05/11/15, which was normal. CBC showed mild anemia.  BMP was normal.  TSH was 1.77.  PAST MEDICAL HISTORY: Past Medical History  Diagnosis Date  . Fibromyalgia   . Back pain   . Ovarian cyst   . Cancer ([place])     breast CA at 35 y/o  . Cancer of spine ([place])     spine CA for past 10 years  . Breast cancer ([place])   . Depression     PAST SURGICAL HISTORY: Past Surgical History  Procedure Laterality Date  . Tubal ligation    . Cesarean section    . Ovarian cyst removal      MEDICATIONS: Current Outpatient Prescriptions on File Prior to Visit  Medication Sig Dispense Refill  . HYDROcodone-acetaminophen (NORCO/VICODIN) 5-325 MG per tablet Take 1 tablet by mouth every 6 (six) hours as needed for moderate pain. 120 tablet 0  . diphenhydrAMINE (SOMINEX) 25 MG tablet Take 25 mg by mouth at bedtime as needed for itching or sleep. Reported on 06/04/2015    . Polyvinyl Alcohol-Povidone (REFRESH OP) Place 1 drop into both eyes daily as needed. Reported on 06/04/2015    . tapentadol (NUCYNTA) 50 MG TABS tablet Take 50 mg by mouth. Reported on 06/04/2015    . traZODone (DESYREL) 50 MG tablet Take 1 tablet (50 mg total) by mouth at bedtime. (Patient not taking: Reported on 06/04/2015) 30 tablet 2   No current facility-administered medications on file prior to visit.    ALLERGIES: No Known Allergies  FAMILY HISTORY: Family History  Problem Relation Age of Onset  . Hypertension Mother   . Anemia Mother   . Cancer Maternal Aunt     breast  . Kidney disease Maternal Grandmother   . Diabetes Maternal Grandmother   . Breast cancer Maternal Grandmother 78  . Diabetes Paternal Grandfather   . Lupus Other   . Multiple sclerosis Other   . Breast cancer Sister 35  . Breast cancer Paternal Aunt 63  . Diabetes Maternal Grandfather   . Cancer Paternal Grandmother     NOS  . Breast cancer Paternal Aunt 50  . Breast cancer Paternal Aunt   . Ovarian  cancer Paternal Aunt     SOCIAL HISTORY: Social History   Social History  . Marital Status: Married    Spouse Name: N/A  . Number of Children: 1  . Years of Education: N/A   Occupational History  . Not on file.   Social History Main Topics  . Smoking status: Never Smoker   . Smokeless tobacco: Not on file  . Alcohol Use: No  . Drug Use: No  . Sexual Activity: Yes   Other Topics Concern  . Not on file   Social History Narrative    REVIEW OF SYSTEMS: Constitutional: No fevers, chills, or sweats, no generalized fatigue, change in appetite Eyes: No visual changes, double vision, eye pain Ear, nose and throat: No hearing loss, ear pain, nasal congestion, sore throat Cardiovascular: No chest pain, palpitations Respiratory:  No shortness of breath at rest or with exertion, wheezes GastrointestinaI: No nausea, vomiting, diarrhea, abdominal pain, fecal incontinence Genitourinary:  No dysuria, urinary retention or frequency Musculoskeletal:  No neck pain, back pain Integumentary: No rash, pruritus, skin lesions Neurological: as above Psychiatric: depression, insomnia Endocrine: No palpitations, fatigue, diaphoresis, mood swings, change in appetite, change in weight, increased thirst Hematologic/Lymphatic:  No anemia, purpura, petechiae. Allergic/Immunologic: no itchy/runny eyes, nasal congestion, recent allergic reactions, rashes  PHYSICAL EXAM: Filed Vitals:   06/04/15 0758  BP: 124/76  Pulse: 81   General: No acute distress.  Patient appears well-groomed. Head:  Normocephalic/atraumatic Eyes:  fundi unremarkable, without vessel changes, exudates, hemorrhages or papilledema. Neck: supple, no paraspinal tenderness, full range of motion Back: No paraspinal tenderness Heart: regular rate and rhythm Lungs: Clear to auscultation bilaterally. Vascular: No carotid bruits. Neurological Exam: Mental status: alert and oriented to person, place, and time, recent and remote  memory intact, fund of knowledge intact, attention and concentration intact, speech fluent and not dysarthric, language intact. Cranial nerves: CN I: not tested CN II: pupils equal, round and reactive to light, visual fields intact, fundi unremarkable, without vessel changes, exudates, hemorrhages or papilledema. CN III, IV, VI:  full range of motion, no nystagmus, no ptosis CN V: facial sensation intact CN VII: upper and lower face symmetric CN VIII: hearing intact CN IX, X: gag intact, uvula midline CN XI: sternocleidomastoid and trapezius muscles intact CN XII: tongue midline Bulk & Tone: normal, no fasciculations. Motor:  5/5 throughout Sensation: temperature and vibration sensation intact. Deep Tendon Reflexes:  2+ throughout, toes downgoing.  Finger to nose testing:  Without dysmetria.  Heel to shin:  Without dysmetria.  Gait:  Normal station and stride.  Able to turn and tandem walk. Romberg negative.  IMPRESSION: Headaches, unclear but may be tension-type headache but also possibly migraine (given the severity), complicated by medication overuse Recurrent loss of consciousness.  It sounds more like syncope than seizure  PLAN: 1.  She was  started on sertraline about a month ago.  We will discontinue this and instead start nortriptyline 25mg  at bedtime (as it can address both headache and depression).  She will call in 4 weeks with update and we can adjust dose for headache or depression. 2.  Naproxen 500mg  for abortive therapy 3.  Taking narcotics and pain relievers more than 2 days out of week precipitates chronic headache.  She must weigh pros and cons about taking the Vicodin but she is aware this can compromise effective treatment of headache 4.  Lifestyle modification 5.  Check EEG 6.  Discussed [place] law stating person should not drive for 6 months for unexplained unprovoked loss of consciousness or awareness. 7.  Follow up in 3 months.  Thank you for allowing me to take  part in the care of this patient.  Metta Clines, DO  CC:  London Pepper, MD

## 2015-06-04 NOTE — Progress Notes (Signed)
Chart forwarded.  

## 2015-06-04 NOTE — Patient Instructions (Signed)
1.  Stop sertraline.  Instead, we will start nortriptyline 25mg  at bedtime.  Call in 4 weeks with update and we can increase dose if needed.  If you continue to feel depressed, we can also increase nortriptyline further as well. 2.  For acute headache attack, take naproxen 500mg .  May repeat dose in 12 hours if needed.  Do not take more than 2 days out of the week 3.  Ideally, you must limit pain relievers to no more than 2 days out of the week (so 5 days a week, you should not take any pain reliever) to prevent rebound headache.  Also, ideally narcotics should be avoided as they easily cause rebound headache. 4.  Follow sleep hygiene instructions 5.  Do not skip meals.  Drink plenty of water 6.  Will get EEG 7.  As per [place] law, you should not drive for 6 months after having an episode of loss of consciousness or loss of awareness for unknown reason 8.  Follow up in 3 months but call in 4 weeks with update (keep headache diary)

## 2015-06-14 ENCOUNTER — Other Ambulatory Visit: Payer: BLUE CROSS/BLUE SHIELD

## 2015-06-23 ENCOUNTER — Emergency Department (HOSPITAL_COMMUNITY)
Admission: EM | Admit: 2015-06-23 | Discharge: 2015-06-23 | Disposition: A | Discharge status: Home / Self Care | Payer: BLUE CROSS/BLUE SHIELD | Attending: Emergency Medicine | Admitting: Emergency Medicine

## 2015-06-23 ENCOUNTER — Encounter (HOSPITAL_COMMUNITY): Payer: Self-pay

## 2015-06-23 DIAGNOSIS — Z8739 Personal history of other diseases of the musculoskeletal system and connective tissue: Secondary | ICD-10-CM | POA: Diagnosis not present

## 2015-06-23 DIAGNOSIS — Z8589 Personal history of malignant neoplasm of other organs and systems: Secondary | ICD-10-CM | POA: Diagnosis not present

## 2015-06-23 DIAGNOSIS — Z8742 Personal history of other diseases of the female genital tract: Secondary | ICD-10-CM | POA: Insufficient documentation

## 2015-06-23 DIAGNOSIS — Z853 Personal history of malignant neoplasm of breast: Secondary | ICD-10-CM | POA: Diagnosis not present

## 2015-06-23 DIAGNOSIS — J029 Acute pharyngitis, unspecified: Secondary | ICD-10-CM | POA: Diagnosis present

## 2015-06-23 DIAGNOSIS — F329 Major depressive disorder, single episode, unspecified: Secondary | ICD-10-CM | POA: Insufficient documentation

## 2015-06-23 DIAGNOSIS — J069 Acute upper respiratory infection, unspecified: Secondary | ICD-10-CM

## 2015-06-23 DIAGNOSIS — Z79891 Long term (current) use of opiate analgesic: Secondary | ICD-10-CM | POA: Insufficient documentation

## 2015-06-23 DIAGNOSIS — Z79899 Other long term (current) drug therapy: Secondary | ICD-10-CM | POA: Diagnosis not present

## 2015-06-23 LAB — RAPID STREP SCREEN (MED CTR MEBANE ONLY): Streptococcus, Group A Screen (Direct): NEGATIVE

## 2015-06-23 MED ORDER — LIDOCAINE VISCOUS 2 % MT SOLN
15.0000 mL | Freq: Once | OROMUCOSAL | Status: AC
  Administered 2015-06-23: 15 mL via OROMUCOSAL
  Filled 2015-06-23: qty 15

## 2015-06-23 MED ORDER — LIDOCAINE VISCOUS 2 % MT SOLN: 20.0000 mL | OROMUCOSAL | Status: DC | PRN

## 2015-06-23 NOTE — ED Notes (Signed)
Pt endorses she's had a sore throat and congestion x2 days, body aches,and fevers. No meds given PTA. On presentation pt c/o sore throat, hoarseness, and congestion.

## 2015-06-23 NOTE — Discharge Instructions (Signed)
Upper Respiratory Infection, Adult Follow up with your primary care provider.  Most upper respiratory infections (URIs) are caused by a virus. A URI affects the nose, throat, and upper air passages. The most common type of URI is often called "the common cold." HOME CARE   Take medicines only as told by your doctor.  Gargle warm saltwater or take cough drops to comfort your throat as told by your doctor.  Use a warm mist humidifier or inhale steam from a shower to increase air moisture. This may make it easier to breathe.  Drink enough fluid to keep your pee (urine) clear or pale yellow.  Eat soups and other clear broths.  Have a healthy diet.  Rest as needed.  Go back to work when your fever is gone or your doctor says it is okay.  You may need to stay home longer to avoid giving your URI to others.  You can also wear a face mask and wash your hands often to prevent spread of the virus.  Use your inhaler more if you have asthma.  Do not use any tobacco products, including cigarettes, chewing tobacco, or electronic cigarettes. If you need help quitting, ask your doctor. GET HELP IF:  You are getting worse, not better.  Your symptoms are not helped by medicine.  You have chills.  You are getting more short of breath.  You have brown or red mucus.  You have yellow or brown discharge from your nose.  You have pain in your face, especially when you bend forward.  You have a fever.  You have puffy (swollen) neck glands.  You have pain while swallowing.  You have white areas in the back of your throat. GET HELP RIGHT AWAY IF:   You have very bad or constant:  Headache.  Ear pain.  Pain in your forehead, behind your eyes, and over your cheekbones (sinus pain).  Chest pain.  You have long-lasting (chronic) lung disease and any of the following:  Wheezing.  Long-lasting cough.  Coughing up blood.  A change in your usual mucus.  You have a stiff  neck.  You have changes in your:  Vision.  Hearing.  Thinking.  Mood. MAKE SURE YOU:   Understand these instructions.  Will watch your condition.  Will get help right away if you are not doing well or get worse.   This information is not intended to replace advice given to you by your health care provider. Make sure you discuss any questions you have with your health care provider.   Document Released: 10/15/2007 Document Revised: 09/12/2014 Document Reviewed: 08/03/2013 Elsevier Interactive Patient Education [employer].

## 2015-06-23 NOTE — ED Provider Notes (Signed)
CSN: [id number]     Arrival date & time 06/23/15  [id number] History   First MD Initiated Contact with Patient 06/23/15 [phone]     Chief Complaint  Patient presents with  . Sore Throat    (Consider location/radiation/quality/duration/timing/severity/associated sxs/prior Treatment) Patient is a 35 y.o. female presenting with pharyngitis. The history is provided by the patient. No language interpreter was used.  Sore Throat Pertinent negatives include no coughing.    Jessica Horn is a 35 y.o female with a history of back pain, breast and spinal cancer, and depression who present with gradual onset worsening sore throat and congestion x 2 days, body aches, and fever. No treatment PTA. Mom describes her sore throat as swallowing a beach ball.  Daughter is with mom and also has sore throat and URI symptoms.   Past Medical History  Diagnosis Date  . Fibromyalgia   . Back pain   . Ovarian cyst   . Cancer ([place])     breast CA at 35 y/o  . Cancer of spine ([place])     spine CA for past 10 years  . Breast cancer ([place])   . Depression    Past Surgical History  Procedure Laterality Date  . Tubal ligation    . Cesarean section    . Ovarian cyst removal     Family History  Problem Relation Age of Onset  . Hypertension Mother   . Anemia Mother   . Cancer Maternal Aunt     breast  . Kidney disease Maternal Grandmother   . Diabetes Maternal Grandmother   . Breast cancer Maternal Grandmother 78  . Diabetes Paternal Grandfather   . Lupus Other   . Multiple sclerosis Other   . Breast cancer Sister 39  . Breast cancer Paternal Aunt 39  . Diabetes Maternal Grandfather   . Cancer Paternal Grandmother     NOS  . Breast cancer Paternal Aunt 69  . Breast cancer Paternal Aunt   . Ovarian cancer Paternal Aunt    Social History  Substance Use Topics  . Smoking status: Never Smoker   . Smokeless tobacco: None  . Alcohol Use: No   OB History    Gravida Para Term Preterm AB TAB SAB Ectopic  Multiple Living   3 1  1 2  2   1      Review of Systems  Respiratory: Negative for cough and shortness of breath.   All other systems reviewed and are negative.     Allergies  Review of patient's allergies indicates no known allergies.  Home Medications   Prior to Admission medications   Medication Sig Start Date End Date Taking? Authorizing Provider  diphenhydrAMINE (SOMINEX) 25 MG tablet Take 25 mg by mouth at bedtime as needed for itching or sleep. Reported on 06/04/2015    Historical Provider, MD  HYDROcodone-acetaminophen (NORCO/VICODIN) 5-325 MG per tablet Take 1 tablet by mouth every 6 (six) hours as needed for moderate pain. 01/10/15   Bayard Hugger, NP  lidocaine (XYLOCAINE) 2 % solution Use as directed 20 mLs in the mouth or throat as needed for mouth pain. 06/23/15   Jessica Havel Patel-Mills, PA-C  naproxen (NAPROSYN) 500 MG tablet Take 1 tablet (500 mg total) by mouth every 12 (twelve) hours as needed. 06/04/15   Pieter Partridge, DO  nortriptyline (PAMELOR) 25 MG capsule Take 1 capsule (25 mg total) by mouth at bedtime. 06/04/15   Pieter Partridge, DO  Polyvinyl Alcohol-Povidone (REFRESH OP) Place 1 drop  into both eyes daily as needed. Reported on 06/04/2015    Historical Provider, MD  promethazine (PHENERGAN) 25 MG tablet Take 25 mg by mouth every 6 (six) hours as needed for nausea or vomiting.    Historical Provider, MD  tapentadol (NUCYNTA) 50 MG TABS tablet Take 50 mg by mouth. Reported on 06/04/2015    Historical Provider, MD  traZODone (DESYREL) 50 MG tablet Take 1 tablet (50 mg total) by mouth at bedtime. Patient not taking: Reported on 06/04/2015 08/25/14   Meredith Staggers, MD   BP 107/77 mmHg  Pulse 96  Temp(Src) 98.4 F (36.9 C) (Oral)  Resp 20  Wt 68.947 kg  SpO2 98%  LMP 02/16/2014 Physical Exam  Constitutional: She is oriented to person, place, and time. She appears well-developed and well-nourished.  HENT:  Head: Normocephalic and atraumatic.  Mouth/Throat: Oropharynx  is clear and moist.  Oropharynx is clear and moist. No tonsillar edema or exudates. No anterior cervical lymphadenopathy. No trismus or drooling.  Eyes: Conjunctivae are normal.  Neck: Normal range of motion. Neck supple.  Cardiovascular: Normal rate, regular rhythm and normal heart sounds.   Pulmonary/Chest: Effort normal and breath sounds normal. No respiratory distress. She has no wheezes. She has no rales.  Heart: Regular rate and rhythm. No murmur. Lungs: Clear to auscultation bilaterally. No wheezing or decreased breath sounds.  Neurological: She is alert and oriented to person, place, and time.  Skin: Skin is warm and dry.  Nursing note and vitals reviewed.   ED Course  Procedures (including critical care time) Labs Review Labs Reviewed  RAPID STREP SCREEN (NOT AT [facility])  CULTURE, GROUP A STREP [facility])    Imaging Review No results found. I have personally reviewed and evaluated these lab results as part of my medical decision-making.   EKG Interpretation None      MDM   Final diagnoses:  Viral URI   Patient presents for sore throat, fever, and congestion 2 days. Other family members with similar symptoms. Denies any vomiting or diarrhea. Strep negative. I believe that this is most likely viral. Mom was prescribed viscous lidocaine. I discussed return precautions as well as follow-up and she agrees with plan.    Jessica Glazier, PA-C 06/23/15 1521  Jessica Boss, MD 06/24/15 [phone]

## 2015-06-25 LAB — CULTURE, GROUP A STREP (THRC)

## 2015-07-09 ENCOUNTER — Emergency Department (HOSPITAL_COMMUNITY)
Admission: EM | Admit: 2015-07-09 | Discharge: 2015-07-09 | Disposition: A | Discharge status: Home / Self Care | Payer: BLUE CROSS/BLUE SHIELD

## 2015-07-09 NOTE — ED Notes (Addendum)
Called for triage 3X- no response

## 2015-07-09 NOTE — ED Notes (Signed)
Called for triage no response 

## 2015-07-10 ENCOUNTER — Emergency Department (HOSPITAL_COMMUNITY)
Admission: EM | Admit: 2015-07-10 | Discharge: 2015-07-10 | Disposition: A | Discharge status: Home / Self Care | Payer: BLUE CROSS/BLUE SHIELD | Attending: Emergency Medicine | Admitting: Emergency Medicine

## 2015-07-10 ENCOUNTER — Encounter (HOSPITAL_COMMUNITY): Payer: Self-pay | Admitting: Emergency Medicine

## 2015-07-10 ENCOUNTER — Emergency Department (HOSPITAL_COMMUNITY): Payer: BLUE CROSS/BLUE SHIELD

## 2015-07-10 DIAGNOSIS — Z853 Personal history of malignant neoplasm of breast: Secondary | ICD-10-CM | POA: Diagnosis not present

## 2015-07-10 DIAGNOSIS — Z85830 Personal history of malignant neoplasm of bone: Secondary | ICD-10-CM | POA: Insufficient documentation

## 2015-07-10 DIAGNOSIS — Z79891 Long term (current) use of opiate analgesic: Secondary | ICD-10-CM | POA: Diagnosis not present

## 2015-07-10 DIAGNOSIS — S29012A Strain of muscle and tendon of back wall of thorax, initial encounter: Secondary | ICD-10-CM | POA: Insufficient documentation

## 2015-07-10 DIAGNOSIS — S299XXA Unspecified injury of thorax, initial encounter: Secondary | ICD-10-CM | POA: Diagnosis present

## 2015-07-10 DIAGNOSIS — F329 Major depressive disorder, single episode, unspecified: Secondary | ICD-10-CM | POA: Diagnosis not present

## 2015-07-10 DIAGNOSIS — Z79899 Other long term (current) drug therapy: Secondary | ICD-10-CM | POA: Insufficient documentation

## 2015-07-10 DIAGNOSIS — Y9289 Other specified places as the place of occurrence of the external cause: Secondary | ICD-10-CM | POA: Diagnosis not present

## 2015-07-10 DIAGNOSIS — W01198A Fall on same level from slipping, tripping and stumbling with subsequent striking against other object, initial encounter: Secondary | ICD-10-CM | POA: Insufficient documentation

## 2015-07-10 DIAGNOSIS — Y9389 Activity, other specified: Secondary | ICD-10-CM | POA: Diagnosis not present

## 2015-07-10 DIAGNOSIS — Y999 Unspecified external cause status: Secondary | ICD-10-CM | POA: Insufficient documentation

## 2015-07-10 DIAGNOSIS — Z8739 Personal history of other diseases of the musculoskeletal system and connective tissue: Secondary | ICD-10-CM | POA: Diagnosis not present

## 2015-07-10 DIAGNOSIS — Z8742 Personal history of other diseases of the female genital tract: Secondary | ICD-10-CM | POA: Insufficient documentation

## 2015-07-10 DIAGNOSIS — T148XXA Other injury of unspecified body region, initial encounter: Secondary | ICD-10-CM

## 2015-07-10 MED ORDER — ONDANSETRON 4 MG PO TBDP: 4.0000 mg | ORAL_TABLET | Freq: Three times a day (TID) | ORAL | Status: DC | PRN

## 2015-07-10 MED ORDER — CYCLOBENZAPRINE HCL 5 MG PO TABS: 5.0000 mg | ORAL_TABLET | Freq: Two times a day (BID) | ORAL | Status: DC | PRN

## 2015-07-10 MED ORDER — CYCLOBENZAPRINE HCL 10 MG PO TABS
5.0000 mg | ORAL_TABLET | Freq: Once | ORAL | Status: AC
  Administered 2015-07-10: 5 mg via ORAL
  Filled 2015-07-10: qty 1

## 2015-07-10 NOTE — ED Notes (Signed)
Patient states her husband is driving.

## 2015-07-10 NOTE — ED Notes (Signed)
Patient here post fall with complaint of upper back pain. States onset 4 days ago after falling backwards onto childs toy. States she tripped and fell, denies LOC. Object was about 8" in diameter.

## 2015-07-10 NOTE — ED Provider Notes (Signed)
CSN: [id number]     Arrival date & time 07/10/15  0516 History   First MD Initiated Contact with Patient 07/10/15 [phone]     Chief Complaint  Patient presents with  . Fall  . Back Pain     (Consider location/radiation/quality/duration/timing/severity/associated sxs/prior Treatment) HPI Comments: Pt states that she is hurting mid back and it is causing her to not be able to raise her arms like she needs to  Patient is a 35 y.o. female presenting with fall and back pain. The history is provided by the patient. No language interpreter was used.  Fall This is a new problem. The current episode started in the past 7 days. The problem occurs constantly. The problem has been gradually worsening. Pertinent negatives include no fever, numbness or weakness. The symptoms are aggravated by twisting. She has tried acetaminophen and NSAIDs for the symptoms. The treatment provided no relief.  Back Pain Associated symptoms: no fever, no numbness and no weakness     Past Medical History  Diagnosis Date  . Fibromyalgia   . Back pain   . Ovarian cyst   . Cancer ([place])     breast CA at 34 y/o  . Cancer of spine ([place])     spine CA for past 10 years  . Breast cancer ([place])   . Depression    Past Surgical History  Procedure Laterality Date  . Tubal ligation    . Cesarean section    . Ovarian cyst removal    . Abdominal hysterectomy     Family History  Problem Relation Age of Onset  . Hypertension Mother   . Anemia Mother   . Cancer Maternal Aunt     breast  . Kidney disease Maternal Grandmother   . Diabetes Maternal Grandmother   . Breast cancer Maternal Grandmother 78  . Diabetes Paternal Grandfather   . Lupus Other   . Multiple sclerosis Other   . Breast cancer Sister 67  . Breast cancer Paternal Aunt 24  . Diabetes Maternal Grandfather   . Cancer Paternal Grandmother     NOS  . Breast cancer Paternal Aunt 62  . Breast cancer Paternal Aunt   . Ovarian cancer Paternal Aunt    Social  History  Substance Use Topics  . Smoking status: Never Smoker   . Smokeless tobacco: None  . Alcohol Use: No   OB History    Gravida Para Term Preterm AB TAB SAB Ectopic Multiple Living   3 1  1 2  2   1      Review of Systems  Constitutional: Negative for fever.  Musculoskeletal: Positive for back pain.  Neurological: Negative for weakness and numbness.  All other systems reviewed and are negative.     Allergies  Review of patient's allergies indicates no known allergies.  Home Medications   Prior to Admission medications   Medication Sig Start Date End Date Taking? Authorizing Provider  diphenhydrAMINE (SOMINEX) 25 MG tablet Take 25 mg by mouth at bedtime as needed for itching or sleep. Reported on 06/04/2015    Historical Provider, MD  HYDROcodone-acetaminophen (NORCO/VICODIN) 5-325 MG per tablet Take 1 tablet by mouth every 6 (six) hours as needed for moderate pain. 01/10/15   Bayard Hugger, NP  lidocaine (XYLOCAINE) 2 % solution Use as directed 20 mLs in the mouth or throat as needed for mouth pain. 06/23/15   Hanna Patel-Mills, PA-C  naproxen (NAPROSYN) 500 MG tablet Take 1 tablet (500 mg total) by mouth  every 12 (twelve) hours as needed. 06/04/15   Pieter Partridge, DO  nortriptyline (PAMELOR) 25 MG capsule Take 1 capsule (25 mg total) by mouth at bedtime. 06/04/15   Pieter Partridge, DO  Polyvinyl Alcohol-Povidone (REFRESH OP) Place 1 drop into both eyes daily as needed. Reported on 06/04/2015    Historical Provider, MD  promethazine (PHENERGAN) 25 MG tablet Take 25 mg by mouth every 6 (six) hours as needed for nausea or vomiting.    Historical Provider, MD  tapentadol (NUCYNTA) 50 MG TABS tablet Take 50 mg by mouth. Reported on 06/04/2015    Historical Provider, MD  traZODone (DESYREL) 50 MG tablet Take 1 tablet (50 mg total) by mouth at bedtime. Patient not taking: Reported on 06/04/2015 08/25/14   Meredith Staggers, MD   BP 147/103 mmHg  Pulse 80  Temp(Src) 99.3 F (37.4 C) (Oral)   Resp 18  SpO2 98%  LMP 02/16/2014 Physical Exam  Constitutional: She is oriented to person, place, and time. She appears well-developed and well-nourished.  Cardiovascular: Normal rate and regular rhythm.   Pulmonary/Chest: Effort normal and breath sounds normal.  Abdominal: Soft. Bowel sounds are normal. There is no tenderness. There is no rebound.  Musculoskeletal:       Thoracic back: She exhibits bony tenderness. She exhibits no swelling and no deformity.  Neurological: She is alert and oriented to person, place, and time. Coordination normal.  Equal strength and sensation to bilateral upper extremeties  Skin: Skin is warm and dry.  Psychiatric: She has a normal mood and affect.  Nursing note and vitals reviewed.   ED Course  Procedures (including critical care time) Labs Review Labs Reviewed - No data to display  Imaging Review Dg Thoracic Spine 2 View  07/10/2015  CLINICAL DATA:  Fall backwards this morning, back pain mid upper back to the right EXAM: THORACIC SPINE 2 VIEWS COMPARISON:  02/08/2015 FINDINGS: Three views of thoracic spine submitted. No acute fracture or subluxation. Alignment, disc spaces and vertebral body heights are preserved. IMPRESSION: Negative. Electronically Signed   By: Lahoma Crocker M.D.   On: 07/10/2015 07:56   I have personally reviewed and evaluated these images and lab results as part of my medical decision-making.   EKG Interpretation None      MDM   Final diagnoses:  Muscle strain   No acute bony injury. No red flag symptoms. Will send home with flexeril and zofran. Discussed follow up and return precautions with pt    Glendell Docker, NP 07/10/15 [id number]  Forde Dandy, MD 07/10/15 [phone]

## 2015-08-05 ENCOUNTER — Encounter (HOSPITAL_COMMUNITY): Payer: Self-pay

## 2015-08-05 ENCOUNTER — Emergency Department (HOSPITAL_COMMUNITY)
Admission: EM | Admit: 2015-08-05 | Discharge: 2015-08-05 | Disposition: A | Discharge status: Home / Self Care | Payer: BLUE CROSS/BLUE SHIELD | Attending: Emergency Medicine | Admitting: Emergency Medicine

## 2015-08-05 ENCOUNTER — Emergency Department (HOSPITAL_COMMUNITY): Payer: BLUE CROSS/BLUE SHIELD

## 2015-08-05 DIAGNOSIS — Z853 Personal history of malignant neoplasm of breast: Secondary | ICD-10-CM | POA: Insufficient documentation

## 2015-08-05 DIAGNOSIS — N898 Other specified noninflammatory disorders of vagina: Secondary | ICD-10-CM | POA: Insufficient documentation

## 2015-08-05 DIAGNOSIS — F329 Major depressive disorder, single episode, unspecified: Secondary | ICD-10-CM | POA: Diagnosis not present

## 2015-08-05 DIAGNOSIS — N83209 Unspecified ovarian cyst, unspecified side: Secondary | ICD-10-CM

## 2015-08-05 DIAGNOSIS — Z90710 Acquired absence of both cervix and uterus: Secondary | ICD-10-CM | POA: Insufficient documentation

## 2015-08-05 DIAGNOSIS — Z85848 Personal history of malignant neoplasm of other parts of nervous tissue: Secondary | ICD-10-CM | POA: Diagnosis not present

## 2015-08-05 DIAGNOSIS — N83201 Unspecified ovarian cyst, right side: Secondary | ICD-10-CM | POA: Diagnosis not present

## 2015-08-05 DIAGNOSIS — Z3202 Encounter for pregnancy test, result negative: Secondary | ICD-10-CM | POA: Diagnosis not present

## 2015-08-05 DIAGNOSIS — R102 Pelvic and perineal pain: Secondary | ICD-10-CM | POA: Diagnosis present

## 2015-08-05 DIAGNOSIS — Z9851 Tubal ligation status: Secondary | ICD-10-CM | POA: Diagnosis not present

## 2015-08-05 DIAGNOSIS — M797 Fibromyalgia: Secondary | ICD-10-CM | POA: Insufficient documentation

## 2015-08-05 DIAGNOSIS — Z79899 Other long term (current) drug therapy: Secondary | ICD-10-CM | POA: Insufficient documentation

## 2015-08-05 DIAGNOSIS — Z792 Long term (current) use of antibiotics: Secondary | ICD-10-CM | POA: Diagnosis not present

## 2015-08-05 DIAGNOSIS — Z9889 Other specified postprocedural states: Secondary | ICD-10-CM | POA: Insufficient documentation

## 2015-08-05 LAB — CBC WITH DIFFERENTIAL/PLATELET
Basophils Absolute: 0 10*3/uL (ref 0.0–0.1)
Basophils Relative: 0 %
EOS ABS: 0.1 10*3/uL (ref 0.0–0.7)
Eosinophils Relative: 2 %
HEMATOCRIT: 35.9 % — AB (ref 36.0–46.0)
HEMOGLOBIN: 11.4 g/dL — AB (ref 12.0–15.0)
LYMPHS ABS: 2.1 10*3/uL (ref 0.7–4.0)
Lymphocytes Relative: 32 %
MCH: 27.7 pg (ref 26.0–34.0)
MCHC: 31.8 g/dL (ref 30.0–36.0)
MCV: 87.1 fL (ref 78.0–100.0)
MONOS PCT: 7 %
Monocytes Absolute: 0.5 10*3/uL (ref 0.1–1.0)
NEUTROS PCT: 59 %
Neutro Abs: 3.9 10*3/uL (ref 1.7–7.7)
Platelets: 225 10*3/uL (ref 150–400)
RBC: 4.12 MIL/uL (ref 3.87–5.11)
RDW: 14.2 % (ref 11.5–15.5)
WBC: 6.5 10*3/uL (ref 4.0–10.5)

## 2015-08-05 LAB — COMPREHENSIVE METABOLIC PANEL
ALBUMIN: 3.7 g/dL (ref 3.5–5.0)
ALK PHOS: 66 U/L (ref 38–126)
ALT: 10 U/L — ABNORMAL LOW (ref 14–54)
ANION GAP: 10 (ref 5–15)
AST: 17 U/L (ref 15–41)
BILIRUBIN TOTAL: 0.3 mg/dL (ref 0.3–1.2)
BUN: 11 mg/dL (ref 6–20)
CALCIUM: 9.5 mg/dL (ref 8.9–10.3)
CO2: 23 mmol/L (ref 22–32)
Chloride: 107 mmol/L (ref 101–111)
Creatinine, Ser: 0.59 mg/dL (ref 0.44–1.00)
GFR calc Af Amer: >60 mL/min (ref >60)
GFR calc non Af Amer: >60 mL/min (ref >60)
GLUCOSE: 96 mg/dL (ref 65–99)
Potassium: 4.1 mmol/L (ref 3.5–5.1)
SODIUM: 140 mmol/L (ref 135–145)
Total Protein: 7 g/dL (ref 6.5–8.1)

## 2015-08-05 LAB — URINALYSIS, ROUTINE W REFLEX MICROSCOPIC
BILIRUBIN URINE: NEGATIVE
Glucose, UA: NEGATIVE mg/dL
HGB URINE DIPSTICK: NEGATIVE
KETONES UR: NEGATIVE mg/dL
Leukocytes, UA: NEGATIVE
Nitrite: NEGATIVE
Protein, ur: NEGATIVE mg/dL
Specific Gravity, Urine: 1.023 (ref 1.005–1.030)
pH: 6 (ref 5.0–8.0)

## 2015-08-05 LAB — CBC
HEMATOCRIT: 39.1 % (ref 36.0–46.0)
HEMOGLOBIN: 12.2 g/dL (ref 12.0–15.0)
MCH: 27.5 pg (ref 26.0–34.0)
MCHC: 31.2 g/dL (ref 30.0–36.0)
MCV: 88.3 fL (ref 78.0–100.0)
Platelets: 253 10*3/uL (ref 150–400)
RBC: 4.43 MIL/uL (ref 3.87–5.11)
RDW: 14.4 % (ref 11.5–15.5)
WBC: 5.6 10*3/uL (ref 4.0–10.5)

## 2015-08-05 LAB — I-STAT BETA HCG BLOOD, ED (MC, WL, AP ONLY): I-stat hCG, quantitative: <5 m[IU]/mL (ref <5)

## 2015-08-05 LAB — WET PREP, GENITAL
SPERM: NONE SEEN
Trich, Wet Prep: NONE SEEN
Yeast Wet Prep HPF POC: NONE SEEN

## 2015-08-05 LAB — LIPASE, BLOOD: Lipase: 20 U/L (ref 11–51)

## 2015-08-05 MED ORDER — METRONIDAZOLE 500 MG PO TABS: 500.0000 mg | ORAL_TABLET | Freq: Two times a day (BID) | ORAL | Status: DC

## 2015-08-05 MED ORDER — MORPHINE SULFATE (PF) 4 MG/ML IV SOLN
4.0000 mg | Freq: Once | INTRAVENOUS | Status: AC
  Administered 2015-08-05: 4 mg via INTRAVENOUS
  Filled 2015-08-05: qty 1

## 2015-08-05 MED ORDER — ONDANSETRON HCL 4 MG/2ML IJ SOLN
4.0000 mg | Freq: Once | INTRAMUSCULAR | Status: AC
  Administered 2015-08-05: 4 mg via INTRAVENOUS
  Filled 2015-08-05: qty 2

## 2015-08-05 MED ORDER — ONDANSETRON 4 MG PO TBDP: 4.0000 mg | ORAL_TABLET | Freq: Three times a day (TID) | ORAL | Status: DC | PRN

## 2015-08-05 MED ORDER — HYDROCODONE-ACETAMINOPHEN 5-325 MG PO TABS: 1.0000 | ORAL_TABLET | Freq: Four times a day (QID) | ORAL | Status: DC | PRN

## 2015-08-05 NOTE — ED Notes (Signed)
Pelvic cart set up at bedside  

## 2015-08-05 NOTE — ED Provider Notes (Signed)
CSN: [id number]     Arrival date & time 08/05/15  1355 History   First MD Initiated Contact with Patient 08/05/15 1621     Chief Complaint  Patient presents with  . Abdominal Pain      HPI  35 year old female with history of breast cancer, multiple ovarian cysts, "ulcerations in my uterus", status post hysterectomy with removal of all of her pelvic organs with the exception of her right ovary, who presents with right-sided pelvic pain. Pain began suddenly last night. This described as sharp, stabbing, and constant. Denies fevers. Reports nausea and vomiting twice during the day today when the pain was at its most severe. Vomiting is nonbloody, nonbilious. She has not tried any medication for the pain. Denies dysuria, frequency, hematuria. Denies vaginal discharge.   Past Medical History  Diagnosis Date  . Fibromyalgia   . Back pain   . Ovarian cyst   . Cancer ([place])     breast CA at 35 y/o  . Cancer of spine ([place])     spine CA for past 10 years  . Breast cancer ([place])   . Depression    Past Surgical History  Procedure Laterality Date  . Tubal ligation    . Cesarean section    . Ovarian cyst removal    . Abdominal hysterectomy     Family History  Problem Relation Age of Onset  . Hypertension Mother   . Anemia Mother   . Cancer Maternal Aunt     breast  . Kidney disease Maternal Grandmother   . Diabetes Maternal Grandmother   . Breast cancer Maternal Grandmother 78  . Diabetes Paternal Grandfather   . Lupus Other   . Multiple sclerosis Other   . Breast cancer Sister 8  . Breast cancer Paternal Aunt 58  . Diabetes Maternal Grandfather   . Cancer Paternal Grandmother     NOS  . Breast cancer Paternal Aunt 60  . Breast cancer Paternal Aunt   . Ovarian cancer Paternal Aunt    Social History  Substance Use Topics  . Smoking status: Never Smoker   . Smokeless tobacco: None  . Alcohol Use: No   OB History    Gravida Para Term Preterm AB TAB SAB Ectopic Multiple Living    3 1  1 2  2   1      Review of Systems  Constitutional: Negative for fever, chills, activity change and appetite change.  HENT: Negative for congestion, rhinorrhea and sore throat.   Eyes: Negative for visual disturbance.  Respiratory: Negative for cough, shortness of breath and wheezing.   Cardiovascular: Negative for chest pain and palpitations.  Gastrointestinal: Positive for nausea and vomiting. Negative for abdominal pain, diarrhea, constipation, blood in stool and abdominal distention.  Genitourinary: Positive for pelvic pain (R pelvis). Negative for dysuria, frequency, flank pain, decreased urine volume, vaginal bleeding, vaginal discharge and vaginal pain.  Musculoskeletal: Negative for myalgias, back pain, joint swelling, arthralgias, gait problem, neck pain and neck stiffness.  Skin: Negative for rash.  Neurological: Negative for dizziness, tremors, syncope, facial asymmetry, speech difficulty, weakness, numbness and headaches.  Psychiatric/Behavioral: Negative for behavioral problems, confusion and agitation.      Allergies  Review of patient's allergies indicates no known allergies.  Home Medications   Prior to Admission medications   Medication Sig Start Date End Date Taking? Authorizing Provider  acetaminophen (TYLENOL) 500 MG tablet Take 1,000 mg by mouth every 6 (six) hours as needed for moderate pain.  Yes Historical Provider, MD  cyclobenzaprine (FLEXERIL) 5 MG tablet Take 1 tablet (5 mg total) by mouth 2 (two) times daily as needed for muscle spasms. 07/10/15  Yes Glendell Docker, NP  ibuprofen (ADVIL,MOTRIN) 200 MG tablet Take 400 mg by mouth every 6 (six) hours as needed for moderate pain.    Yes Historical Provider, MD  naproxen sodium (PAMPRIN ALL DAY RELIEF MAX ST) 220 MG tablet Take 220 mg by mouth 2 (two) times daily as needed (for pain).   Yes Historical Provider, MD  HYDROcodone-acetaminophen (NORCO/VICODIN) 5-325 MG per tablet Take 1 tablet by mouth  every 6 (six) hours as needed for moderate pain. 01/10/15   Bayard Hugger, NP  HYDROcodone-acetaminophen (NORCO/VICODIN) 5-325 MG tablet Take 1 tablet by mouth every 6 (six) hours as needed for severe pain. 08/05/15   Tywan Siever Algernon Huxley, MD  lidocaine (XYLOCAINE) 2 % solution Use as directed 20 mLs in the mouth or throat as needed for mouth pain. 06/23/15   Hanna Patel-Mills, PA-C  metroNIDAZOLE (FLAGYL) 500 MG tablet Take 1 tablet (500 mg total) by mouth 2 (two) times daily. 08/05/15   Enrica Corliss Algernon Huxley, MD  naproxen (NAPROSYN) 500 MG tablet Take 1 tablet (500 mg total) by mouth every 12 (twelve) hours as needed. 06/04/15   Pieter Partridge, DO  nortriptyline (PAMELOR) 25 MG capsule Take 1 capsule (25 mg total) by mouth at bedtime. 06/04/15   Pieter Partridge, DO  ondansetron (ZOFRAN ODT) 4 MG disintegrating tablet Take 1 tablet (4 mg total) by mouth every 8 (eight) hours as needed for nausea or vomiting. 07/10/15   Glendell Docker, NP  ondansetron (ZOFRAN ODT) 4 MG disintegrating tablet Take 1 tablet (4 mg total) by mouth every 8 (eight) hours as needed for nausea or vomiting. 08/05/15   Rhianon Zabawa Algernon Huxley, MD  traZODone (DESYREL) 50 MG tablet Take 1 tablet (50 mg total) by mouth at bedtime. Patient not taking: Reported on 06/04/2015 08/25/14   Meredith Staggers, MD   BP 113/81 mmHg  Pulse 86  Temp(Src) 98.2 F (36.8 C) (Oral)  Resp 16  Ht 5\' 3"  (1.6 m)  Wt 71.396 kg  BMI 27.89 kg/m2  SpO2 99%  LMP 02/16/2014 Physical Exam  Constitutional: She is oriented to person, place, and time. She appears well-developed and well-nourished. No distress.  HENT:  Head: Normocephalic and atraumatic.  Right Ear: External ear normal.  Left Ear: External ear normal.  Nose: Nose normal.  Mouth/Throat: Oropharynx is clear and moist. No oropharyngeal exudate.  Eyes: Conjunctivae and EOM are normal. Pupils are equal, round, and reactive to light. Right eye exhibits no discharge. Left eye exhibits no discharge.   Neck: Normal range of motion. Neck supple.  Cardiovascular: Normal rate, regular rhythm and normal heart sounds.  Exam reveals no gallop and no friction rub.   No murmur heard. Pulmonary/Chest: Breath sounds normal. No respiratory distress. She has no wheezes. She has no rales.  Abdominal: Soft. Bowel sounds are normal. She exhibits no distension and no mass. There is tenderness (TTP of the R pelvis). There is no rebound and no guarding.  Genitourinary:  Normal appearance of the vaginal vault. Copious thick white vaginal discharge present. Normal appearance of vaginal cuff. Patient is status post hysterectomy. No left adnexal tenderness or fullness. There is right adnexal tenderness but no fullness.  Musculoskeletal: Normal range of motion. She exhibits no edema or tenderness.  Neurological: She is alert and oriented to person, place, and time. She exhibits  normal muscle tone.  Skin: Skin is warm and dry. No rash noted. She is not diaphoretic.  Psychiatric: She has a normal mood and affect. Her behavior is normal. Judgment and thought content normal.    ED Course  Procedures (including critical care time) Labs Review Labs Reviewed  WET PREP, GENITAL - Abnormal; Notable for the following:    Clue Cells Wet Prep HPF POC PRESENT (*)    WBC, Wet Prep HPF POC FEW (*)    All other components within normal limits  COMPREHENSIVE METABOLIC PANEL - Abnormal; Notable for the following:    ALT 10 (*)    All other components within normal limits  CBC WITH DIFFERENTIAL/PLATELET - Abnormal; Notable for the following:    Hemoglobin 11.4 (*)    HCT 35.9 (*)    All other components within normal limits  LIPASE, BLOOD  CBC  URINALYSIS, ROUTINE W REFLEX MICROSCOPIC (NOT AT [facility])  I-STAT BETA HCG BLOOD, ED ([facility], [facility], [facility] ONLY)  GC/CHLAMYDIA PROBE AMP ([place]) NOT AT [facility]    Imaging Review US Transvaginal Non-ob  08/05/2015  CLINICAL DATA:  Right lower quadrant pain for 1 day. History of a left  oophorectomy and hysterectomy. EXAM: TRANSABDOMINAL AND TRANSVAGINAL ULTRASOUND OF PELVIS TECHNIQUE: Both transabdominal and transvaginal ultrasound examinations of the pelvis were performed. Transabdominal technique was performed for global imaging of the pelvis including uterus, ovaries, adnexal regions, and pelvic cul-de-sac. It was necessary to proceed with endovaginal exam following the transabdominal exam to visualize the right ovary to better advantage. COMPARISON:  CT, 12/08/1958 FINDINGS: Uterus Surgically absent. Right ovary Measurements: 4.9 x 3.6 x 3.4 cm. Cyst with internal echoes, which may reflect a hemorrhagic cyst. It measures 2.8 x 2.2 x 2.8 cm. There is no internal blood flow within this. No adnexal masses. Left ovary Not visualized. Reportedly surgically absent. No left adnexal masses. Other findings Small amount pelvic free fluid. IMPRESSION: 1. 2.8 cm complex right ovarian cyst which may be a hemorrhagic cyst. 2. Status post hysterectomy and left oophorectomy. No pelvic/adnexal masses. 3. Small amount pelvic free fluid, likely physiologic. Electronically Signed   By: Lajean Manes M.D.   On: 08/05/2015 17:54   US Pelvis Complete  08/05/2015  CLINICAL DATA:  Right lower quadrant pain for 1 day. History of a left oophorectomy and hysterectomy. EXAM: TRANSABDOMINAL AND TRANSVAGINAL ULTRASOUND OF PELVIS TECHNIQUE: Both transabdominal and transvaginal ultrasound examinations of the pelvis were performed. Transabdominal technique was performed for global imaging of the pelvis including uterus, ovaries, adnexal regions, and pelvic cul-de-sac. It was necessary to proceed with endovaginal exam following the transabdominal exam to visualize the right ovary to better advantage. COMPARISON:  CT, 12/08/1958 FINDINGS: Uterus Surgically absent. Right ovary Measurements: 4.9 x 3.6 x 3.4 cm. Cyst with internal echoes, which may reflect a hemorrhagic cyst. It measures 2.8 x 2.2 x 2.8 cm. There is no internal  blood flow within this. No adnexal masses. Left ovary Not visualized. Reportedly surgically absent. No left adnexal masses. Other findings Small amount pelvic free fluid. IMPRESSION: 1. 2.8 cm complex right ovarian cyst which may be a hemorrhagic cyst. 2. Status post hysterectomy and left oophorectomy. No pelvic/adnexal masses. 3. Small amount pelvic free fluid, likely physiologic. Electronically Signed   By: Lajean Manes M.D.   On: 08/05/2015 17:54   I have personally reviewed and evaluated these images and lab results as part of my medical decision-making.   EKG Interpretation None      MDM  Final diagnoses:  Pelvic pain in female  Hemorrhagic ovarian cyst   Patient is generally well-appearing. Tenderness to palpation over the right pelvis as noted above. Pelvic exam performed with right adnexal tenderness and thick white vaginal discharge, but no other abnormalities.  Transvaginal ultrasound obtained to rule out ovarian pathology that reveals a right hemorrhagic ovarian cyst. Initial hemoglobin is 12.2. Repeat 4 hours later is 11.4. There is trace physiologic fluid present in the pelvis, but doubt rupture. Patient remains hemodynamically stable. No acute intervention required at this time. Recommend GYN follow-up next week and return to the emergency department for sudden worsening of her pain, lightheadedness, additional symptoms. UA without indication of UTI and I doubt pyelonephritis. No leukocytosis, fever, appetite changes, and doubt appendicitis. Wet prep positive for clue cells. Will treat for bacterial vaginosis.  Patient given prescription for a small amount of Norco as well as Zofran for treatment of her pain. She is stable for discharge home.    Lorn Butcher Algernon Huxley, MD 08/05/15 2322  Wandra Arthurs, MD 08/07/15 [phone]

## 2015-08-05 NOTE — ED Notes (Signed)
Onset yesterday RLQ abd pain, constant.  Pt reports nausea and vomiting x 2 today- reports that she does this with excruciating pain.

## 2015-08-06 LAB — GC/CHLAMYDIA PROBE AMP (~~LOC~~) NOT AT ARMC
Chlamydia: NEGATIVE
Neisseria Gonorrhea: NEGATIVE

## 2015-08-21 ENCOUNTER — Ambulatory Visit: Payer: BLUE CROSS/BLUE SHIELD | Attending: Psychology | Admitting: Psychology

## 2015-08-21 ENCOUNTER — Encounter: Payer: Self-pay | Admitting: Psychology

## 2015-08-21 DIAGNOSIS — F4310 Post-traumatic stress disorder, unspecified: Secondary | ICD-10-CM

## 2015-08-21 DIAGNOSIS — F339 Major depressive disorder, recurrent, unspecified: Secondary | ICD-10-CM | POA: Diagnosis not present
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
## 2015-09-11 ENCOUNTER — Ambulatory Visit: Payer: BLUE CROSS/BLUE SHIELD | Attending: Psychology | Admitting: Psychology

## 2015-09-11 DIAGNOSIS — F339 Major depressive disorder, recurrent, unspecified: Secondary | ICD-10-CM

## 2015-09-11 DIAGNOSIS — F4310 Post-traumatic stress disorder, unspecified: Secondary | ICD-10-CM | POA: Diagnosis not present
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
## 2015-10-10 ENCOUNTER — Ambulatory Visit: Payer: BLUE CROSS/BLUE SHIELD | Admitting: Neurology

## 2015-10-10 DIAGNOSIS — Z029 Encounter for administrative examinations, unspecified: Secondary | ICD-10-CM

## 2016-02-05 ENCOUNTER — Emergency Department (HOSPITAL_COMMUNITY)
Admission: EM | Admit: 2016-02-05 | Discharge: 2016-02-05 | Disposition: A | Discharge status: Home / Self Care | Payer: BLUE CROSS/BLUE SHIELD | Attending: Emergency Medicine | Admitting: Emergency Medicine

## 2016-02-05 ENCOUNTER — Encounter (HOSPITAL_COMMUNITY): Payer: Self-pay

## 2016-02-05 ENCOUNTER — Emergency Department (HOSPITAL_COMMUNITY): Payer: BLUE CROSS/BLUE SHIELD

## 2016-02-05 DIAGNOSIS — M25532 Pain in left wrist: Secondary | ICD-10-CM | POA: Diagnosis present

## 2016-02-05 DIAGNOSIS — Z85830 Personal history of malignant neoplasm of bone: Secondary | ICD-10-CM | POA: Insufficient documentation

## 2016-02-05 DIAGNOSIS — Z853 Personal history of malignant neoplasm of breast: Secondary | ICD-10-CM | POA: Diagnosis not present

## 2016-02-05 DIAGNOSIS — M654 Radial styloid tenosynovitis [de Quervain]: Secondary | ICD-10-CM | POA: Diagnosis not present

## 2016-02-05 MED ORDER — MELOXICAM 7.5 MG PO TABS: 7.5000 mg | ORAL_TABLET | Freq: Every day | ORAL | 0 refills | Status: DC

## 2016-02-05 MED ORDER — IBUPROFEN 400 MG PO TABS
800.0000 mg | ORAL_TABLET | Freq: Once | ORAL | Status: AC
  Administered 2016-02-05: 800 mg via ORAL
  Filled 2016-02-05: qty 2

## 2016-02-05 MED ORDER — ONDANSETRON 4 MG PO TBDP
8.0000 mg | ORAL_TABLET | Freq: Once | ORAL | Status: AC
  Administered 2016-02-05: 8 mg via ORAL
  Filled 2016-02-05: qty 2

## 2016-02-05 NOTE — ED Provider Notes (Signed)
[facility] DEPT Provider Note   CSN: [id number] Arrival date & time: 02/05/16  1318     History   Chief Complaint Chief Complaint  Patient presents with  . Wrist Pain    HPI Jessica Horn is a 35 y.o. female.  HPI   Is a 35 year old female comes in today complaining of pain in her left wrist that began 4 days ago. She has had no known trauma. She does type at work. She has not noted any redness or swelling. She states that she has a history of a blood clot in her upper extremity 8-10 years ago.  I can find no documentation of this.  Patient reports similar pain in wrist.  Pain in left wrist is worse with movement of thumb especiall ulnar deviation of wrist.  She has some pain with any wrist movement.  Denies injury, redness, swelling, chest pain, dyspnea, or hormone use.    Past Medical History:  Diagnosis Date  . Back pain   . Breast cancer ([place])   . Cancer ([place])    breast CA at 35 y/o  . Cancer of spine ([place])    spine CA for past 10 years  . Depression   . Fibromyalgia   . Ovarian cyst     Patient Active Problem List   Diagnosis Date Noted  . Cephalalgia 06/04/2015  . Medication overuse headache 06/04/2015  . Syncope and collapse 06/04/2015  . Anxiety and depression 01/10/2014  . Von Willebrand disease ([place]) 03/04/2013  . Breast cancer ([place]) 11/05/2012  . Gait disorder 08/09/2012  . Thoracic spine pain 08/09/2012  . Myofascial pain 08/09/2012  . Tremor 08/09/2012  . Cervical spine pain 08/09/2012  . Insomnia 06/11/2012  . Chronic pain of multiple sites 06/11/2012  . Coordination abnormal 06/11/2012  . Abnormal weight gain 06/11/2012  . URI 10/10/2009  . INSOMNIA 10/10/2009  . GASTROENTERITIS 10/20/2008  . OTHER MALAISE AND FATIGUE 07/03/2008  . GERD 06/30/2008  . ABDOMINAL PAIN 06/30/2008  . DEPRESSION 06/26/2008  . ALLERGIC RHINITIS 06/26/2008  . VILLONODULAR SYNOVITIS, HAND 06/26/2008  . WRIST PAIN, LEFT 06/26/2008  . Personal history of  malignant neoplasm of breast 06/26/2008    Past Surgical History:  Procedure Laterality Date  . ABDOMINAL HYSTERECTOMY    . CESAREAN SECTION    . OVARIAN CYST REMOVAL    . TUBAL LIGATION      OB History    Gravida Para Term Preterm AB Living   3 1   1 2 1    SAB TAB Ectopic Multiple Live Births   2       1       Home Medications    Prior to Admission medications   Medication Sig Start Date End Date Taking? Authorizing Provider  acetaminophen (TYLENOL) 500 MG tablet Take 1,000 mg by mouth every 6 (six) hours as needed for moderate pain.     Historical Provider, MD  cyclobenzaprine (FLEXERIL) 5 MG tablet Take 1 tablet (5 mg total) by mouth 2 (two) times daily as needed for muscle spasms. 07/10/15   Glendell Docker, NP  HYDROcodone-acetaminophen (NORCO/VICODIN) 5-325 MG per tablet Take 1 tablet by mouth every 6 (six) hours as needed for moderate pain. 01/10/15   Bayard Hugger, NP  HYDROcodone-acetaminophen (NORCO/VICODIN) 5-325 MG tablet Take 1 tablet by mouth every 6 (six) hours as needed for severe pain. 08/05/15   Jenifer Algernon Huxley, MD  ibuprofen (ADVIL,MOTRIN) 200 MG tablet Take 400 mg by mouth every 6 (six) hours  as needed for moderate pain.     Historical Provider, MD  lidocaine (XYLOCAINE) 2 % solution Use as directed 20 mLs in the mouth or throat as needed for mouth pain. 06/23/15   Hanna Patel-Mills, PA-C  metroNIDAZOLE (FLAGYL) 500 MG tablet Take 1 tablet (500 mg total) by mouth 2 (two) times daily. 08/05/15   Jenifer Algernon Huxley, MD  naproxen (NAPROSYN) 500 MG tablet Take 1 tablet (500 mg total) by mouth every 12 (twelve) hours as needed. 06/04/15   Pieter Partridge, DO  naproxen sodium (PAMPRIN ALL DAY RELIEF MAX ST) 220 MG tablet Take 220 mg by mouth 2 (two) times daily as needed (for pain).    Historical Provider, MD  nortriptyline (PAMELOR) 25 MG capsule Take 1 capsule (25 mg total) by mouth at bedtime. 06/04/15   Pieter Partridge, DO  ondansetron (ZOFRAN ODT) 4 MG disintegrating  tablet Take 1 tablet (4 mg total) by mouth every 8 (eight) hours as needed for nausea or vomiting. 07/10/15   Glendell Docker, NP  ondansetron (ZOFRAN ODT) 4 MG disintegrating tablet Take 1 tablet (4 mg total) by mouth every 8 (eight) hours as needed for nausea or vomiting. 08/05/15   Jenifer Algernon Huxley, MD  traZODone (DESYREL) 50 MG tablet Take 1 tablet (50 mg total) by mouth at bedtime. Patient not taking: Reported on 06/04/2015 08/25/14   Meredith Staggers, MD    Family History Family History  Problem Relation Age of Onset  . Hypertension Mother   . Anemia Mother   . Kidney disease Maternal Grandmother   . Diabetes Maternal Grandmother   . Breast cancer Maternal Grandmother 78  . Diabetes Paternal Grandfather   . Breast cancer Sister 11  . Breast cancer Paternal Aunt 7  . Diabetes Maternal Grandfather   . Cancer Paternal Grandmother     NOS  . Breast cancer Paternal Aunt 72  . Breast cancer Paternal Aunt   . Ovarian cancer Paternal Aunt   . Cancer Maternal Aunt     breast  . Lupus Other   . Multiple sclerosis Other     Social History Social History  Substance Use Topics  . Smoking status: Never Smoker  . Smokeless tobacco: Never Used  . Alcohol use No     Comment: occ     Allergies   Review of patient's allergies indicates no known allergies.   Review of Systems Review of Systems  All other systems reviewed and are negative.    Physical Exam Updated Vital Signs BP 106/62 (BP Location: Right Arm)   Pulse 64   Temp 97.4 F (36.3 C) (Oral)   Resp 16   Ht 5\' 3"  (1.6 m)   Wt 63.5 kg   LMP 02/16/2014   SpO2 100%   BMI 24.80 kg/m   Physical Exam  Constitutional: She is oriented to person, place, and time. She appears well-developed and well-nourished. No distress.  HENT:  Head: Normocephalic.  Eyes: Pupils are equal, round, and reactive to light.  Neck: Normal range of motion. Neck supple.  Cardiovascular: Normal rate.   Abdominal: Soft.    Musculoskeletal:  Left shoulder, upper arm, elbow and forearm normal on exam without swelling, cords, redness, or warmth.  Left wrist is ttp especially radial aspect.  Pain increases with thumb and wrist adducted and ulnar deviation.    Neurological: She is alert and oriented to person, place, and time.  Skin: Skin is warm and dry. Capillary refill takes less than 2 seconds.  Nursing note and vitals reviewed.    ED Treatments / Results  Labs (all labs ordered are listed, but only abnormal results are displayed) Labs Reviewed - No data to display  EKG  EKG Interpretation None       Radiology No results found.  Procedures Procedures (including critical care time)  Medications Ordered in ED Medications  ondansetron (ZOFRAN-ODT) disintegrating tablet 8 mg (not administered)     Initial Impression / Assessment and Plan / ED Course  I have reviewed the triage vital signs and the nursing notes.  Pertinent labs & imaging results that were available during my care of the patient were reviewed by me and considered in my medical decision making (see chart for details).  Clinical Course  Value Comment By Time  DG Wrist Complete Left (Reviewed) Pattricia Boss, MD 09/26 1748  DG Wrist Complete Left (Reviewed) Pattricia Boss, MD 09/26 1748      Final Clinical Impressions(s) / ED Diagnoses   Final diagnoses:  Harriet Pho tenosynovitis, left  splint nsaid Referral for follow up  New Prescriptions New Prescriptions   No medications on file     Pattricia Boss, MD 02/06/16 1243

## 2016-02-05 NOTE — Progress Notes (Signed)
Orthopedic Tech Progress Note Patient Details:  Jessica Horn [birth year] [id number]  Ortho Devices Type of Ortho Device: Thumb velcro splint Ortho Device/Splint Location: LUE Ortho Device/Splint Interventions: Ordered, Application   Braulio Bosch 02/05/2016, 6:12 PM

## 2016-02-05 NOTE — ED Triage Notes (Addendum)
Pt reports left wrist pain X4 days. She reports that she has had blood clot in the extremity 4 years ago. No redness noted. Painful to touch/move.

## 2016-03-04 ENCOUNTER — Encounter (HOSPITAL_COMMUNITY): Payer: Self-pay | Admitting: Emergency Medicine

## 2016-03-04 ENCOUNTER — Emergency Department (HOSPITAL_COMMUNITY)
Admission: EM | Admit: 2016-03-04 | Discharge: 2016-03-04 | Disposition: A | Discharge status: Home / Self Care | Payer: BLUE CROSS/BLUE SHIELD | Attending: Emergency Medicine | Admitting: Emergency Medicine

## 2016-03-04 DIAGNOSIS — M79604 Pain in right leg: Secondary | ICD-10-CM | POA: Diagnosis present

## 2016-03-04 DIAGNOSIS — M79605 Pain in left leg: Secondary | ICD-10-CM | POA: Insufficient documentation

## 2016-03-04 DIAGNOSIS — Z853 Personal history of malignant neoplasm of breast: Secondary | ICD-10-CM | POA: Insufficient documentation

## 2016-03-04 DIAGNOSIS — Z85830 Personal history of malignant neoplasm of bone: Secondary | ICD-10-CM | POA: Diagnosis not present

## 2016-03-04 DIAGNOSIS — M791 Myalgia, unspecified site: Secondary | ICD-10-CM

## 2016-03-04 LAB — CBC WITH DIFFERENTIAL/PLATELET
Basophils Absolute: 0 10*3/uL (ref 0.0–0.1)
Basophils Relative: 0 %
Eosinophils Absolute: 0 10*3/uL (ref 0.0–0.7)
Eosinophils Relative: 0 %
HEMATOCRIT: 35.9 % — AB (ref 36.0–46.0)
Hemoglobin: 11.9 g/dL — ABNORMAL LOW (ref 12.0–15.0)
LYMPHS PCT: 24 %
Lymphs Abs: 2 10*3/uL (ref 0.7–4.0)
MCH: 27.4 pg (ref 26.0–34.0)
MCHC: 33.1 g/dL (ref 30.0–36.0)
MCV: 82.5 fL (ref 78.0–100.0)
MONO ABS: 0.6 10*3/uL (ref 0.1–1.0)
MONOS PCT: 7 %
NEUTROS ABS: 5.7 10*3/uL (ref 1.7–7.7)
Neutrophils Relative %: 69 %
Platelets: 287 10*3/uL (ref 150–400)
RBC: 4.35 MIL/uL (ref 3.87–5.11)
RDW: 12.5 % (ref 11.5–15.5)
WBC: 8.3 10*3/uL (ref 4.0–10.5)

## 2016-03-04 LAB — BASIC METABOLIC PANEL
ANION GAP: 9 (ref 5–15)
BUN: <5 mg/dL — ABNORMAL LOW (ref 6–20)
CALCIUM: 9.7 mg/dL (ref 8.9–10.3)
CO2: 22 mmol/L (ref 22–32)
Chloride: 107 mmol/L (ref 101–111)
Creatinine, Ser: 0.52 mg/dL (ref 0.44–1.00)
GFR calc Af Amer: >60 mL/min (ref >60)
GFR calc non Af Amer: >60 mL/min (ref >60)
GLUCOSE: 110 mg/dL — AB (ref 65–99)
Potassium: 3.6 mmol/L (ref 3.5–5.1)
Sodium: 138 mmol/L (ref 135–145)

## 2016-03-04 LAB — I-STAT BETA HCG BLOOD, ED (MC, WL, AP ONLY): I-stat hCG, quantitative: <5 m[IU]/mL (ref <5)

## 2016-03-04 LAB — CK: Total CK: 72 U/L (ref 38–234)

## 2016-03-04 MED ORDER — KETOROLAC TROMETHAMINE 30 MG/ML IJ SOLN
30.0000 mg | Freq: Once | INTRAMUSCULAR | Status: AC
  Administered 2016-03-04: 30 mg via INTRAVENOUS
  Filled 2016-03-04: qty 1

## 2016-03-04 NOTE — ED Triage Notes (Signed)
Pt. reports bilateral legs shaking with pain onset yesterday , denies injury .
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
## 2016-10-30 IMAGING — CT CT ABD-PELV W/ CM
2 of 4 series · 16 of 46 positions shown, 18 images · IV contrast (omnipaque)
Comparison: None.

CLINICAL DATA: Worsening left lower quadrant abdominal pain, onset
1 week ago.

EXAM:
CT ABDOMEN AND PELVIS WITH CONTRAST
TECHNIQUE: Multidetector CT imaging of the abdomen and pelvis was performed
using the standard protocol following bolus administration of
intravenous contrast.
CONTRAST:  25mL OMNIPAQUE IOHEXOL 300 MG/ML SOLN, 80mL OMNIPAQUE
IOHEXOL 300 MG/ML SOLN

[Series 2: abd/ pelvis 5.0 i30f 1 · axial · 0.61mm/px · z∈[+824,+1224]mm · 13 of 88 slices shown, 15 images]
[im 4/88  soft-tissue]
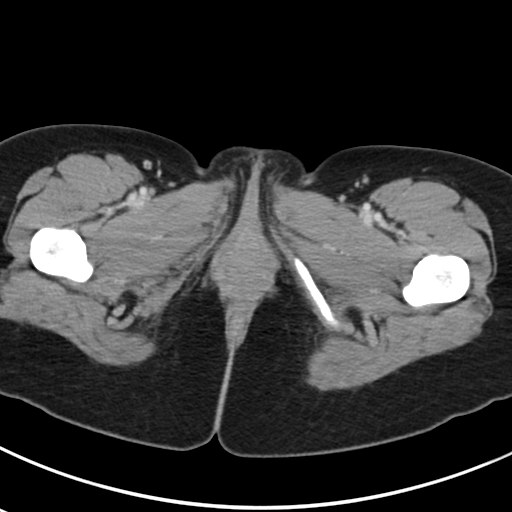
[im 4/88  bone]
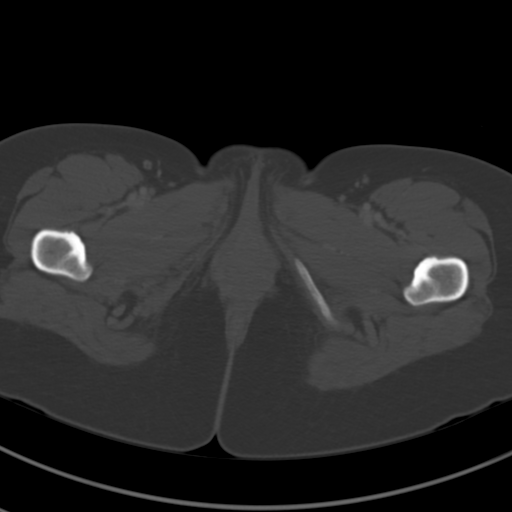
[im 11/88  soft-tissue]
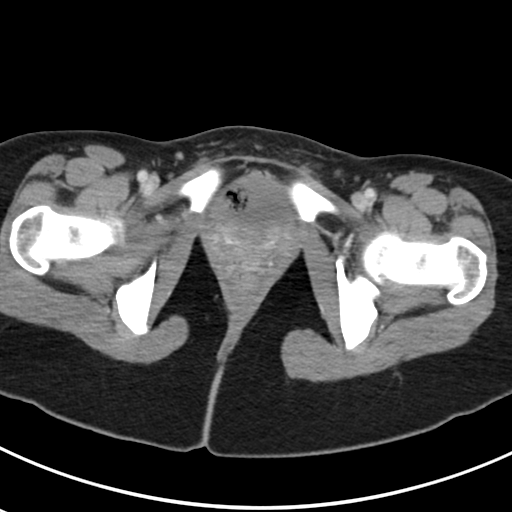
[im 19/88  soft-tissue]
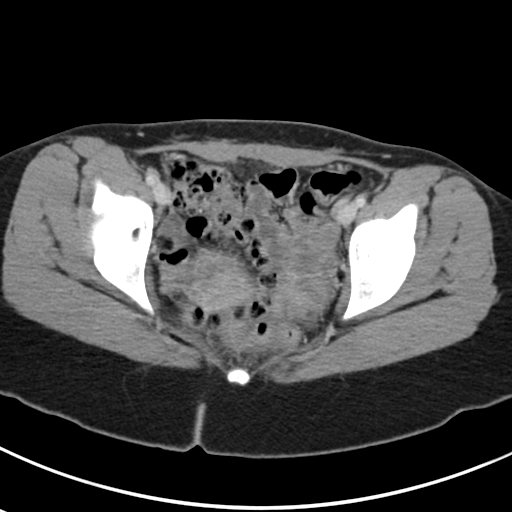
[im 26/88  soft-tissue]
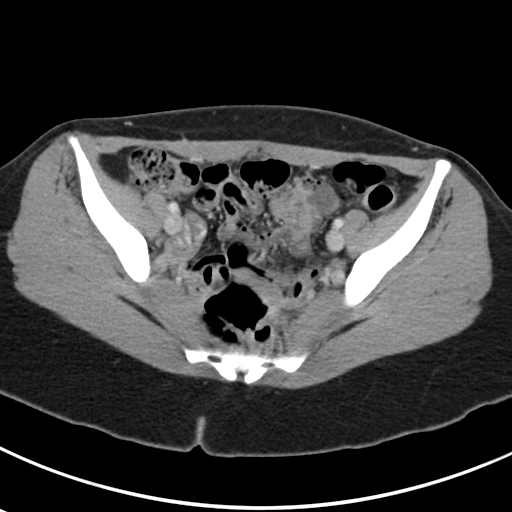
[im 30/88  soft-tissue]
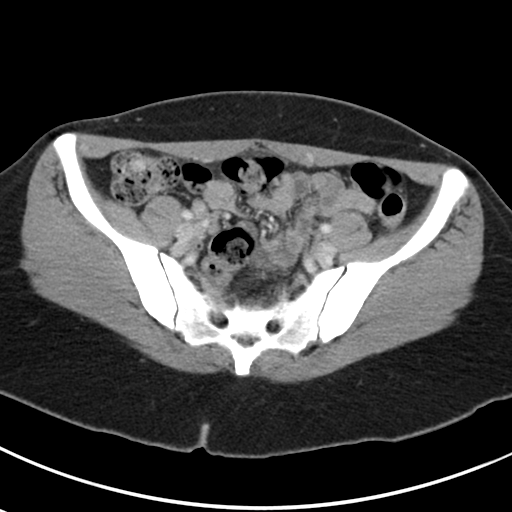
[im 37/88  soft-tissue]
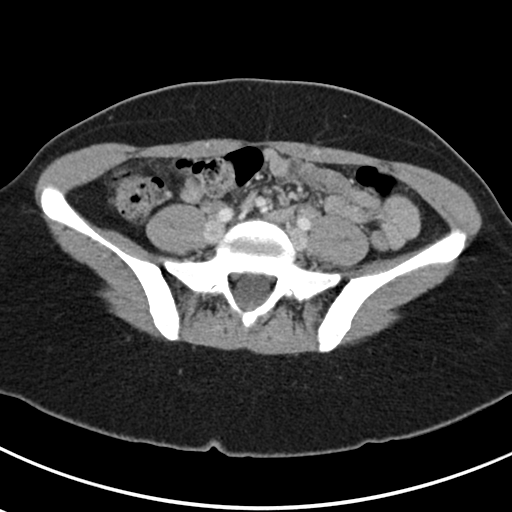
[im 44/88  soft-tissue]
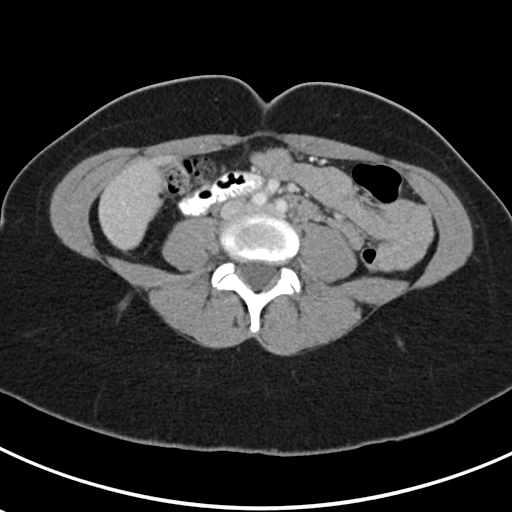
[im 51/88  soft-tissue]
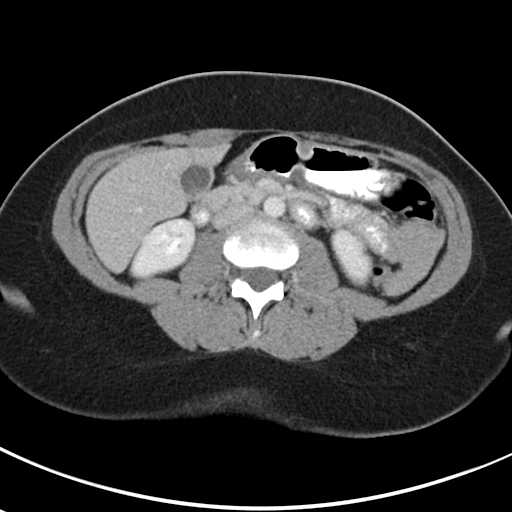
[im 59/88  soft-tissue]
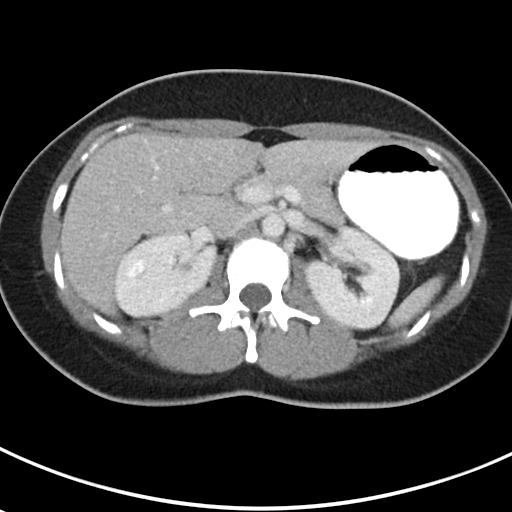
[im 59/88  bone]
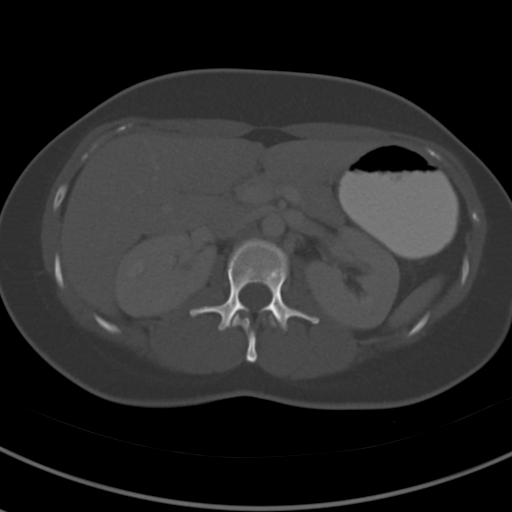
[im 62/88  soft-tissue]
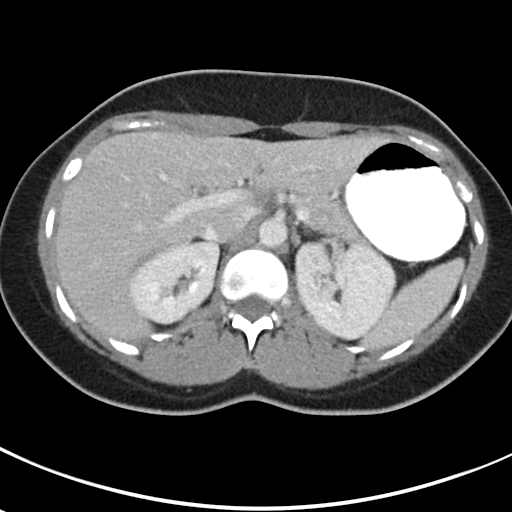
[im 69/88  soft-tissue]
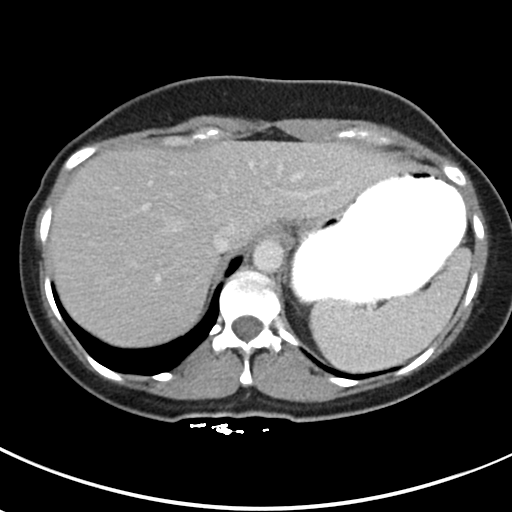
[im 77/88  soft-tissue]
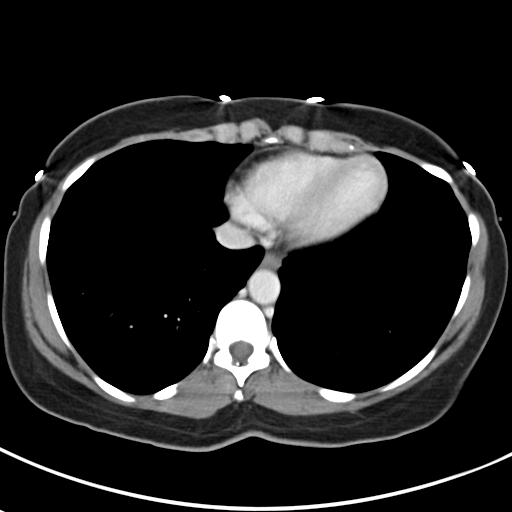
[im 84/88  soft-tissue]
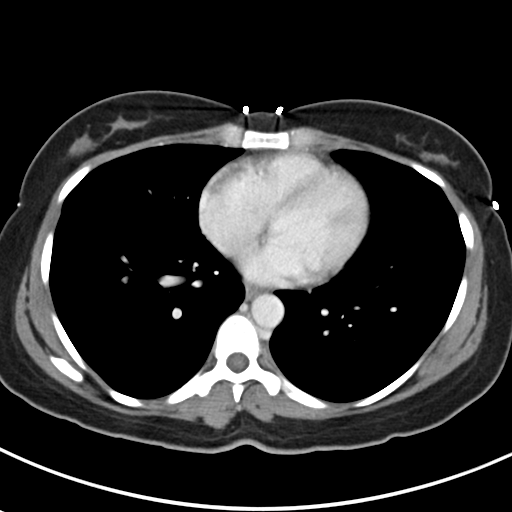

[Series 5: coronal soft tissue · coronal · 0.65mm/px · 3 of 67 slices shown]
[im 23/67  soft-tissue]
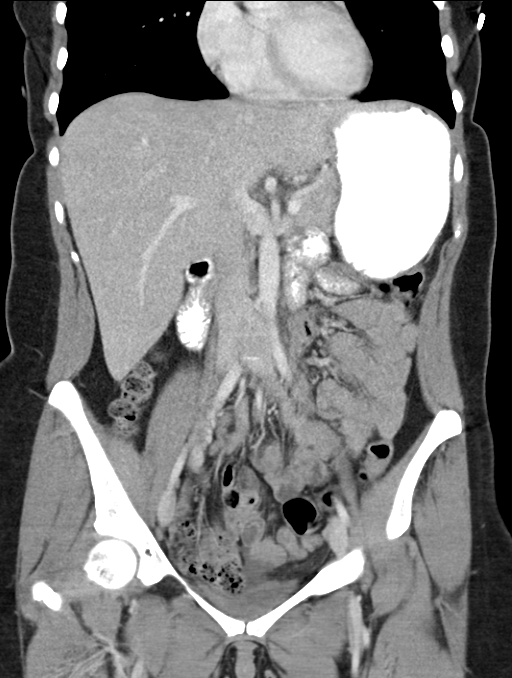
[im 30/67  soft-tissue]
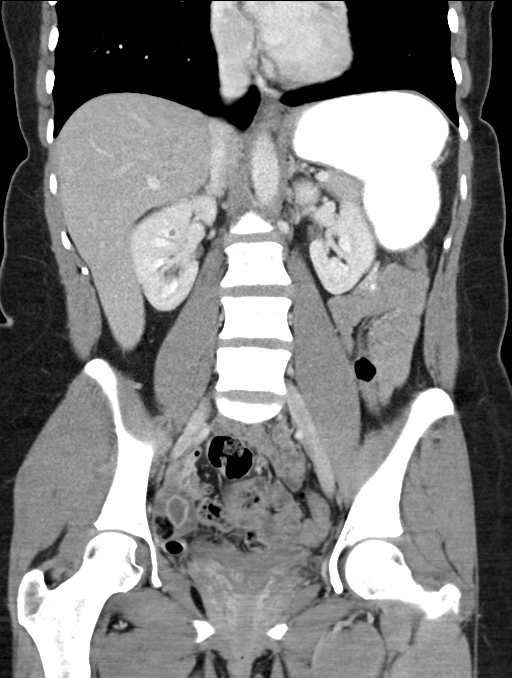
[im 37/67  soft-tissue]
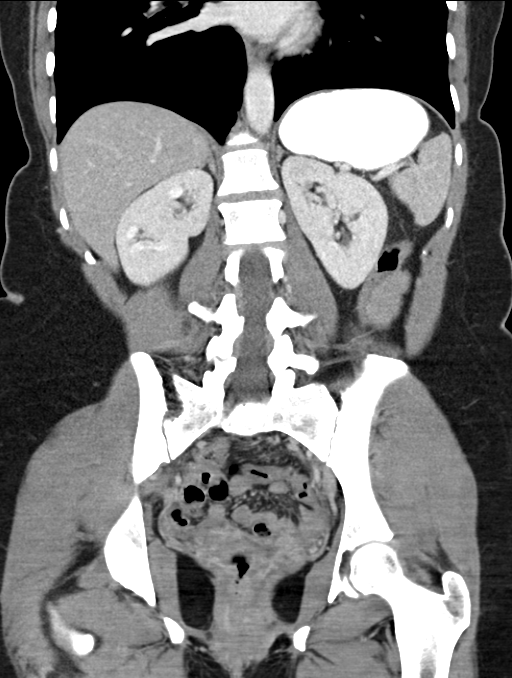

[16 of 46 positions shown; findings below may reference images not displayed]

FINDINGS: Lower chest:  No significant abnormality

Hepatobiliary: There are normal appearances of the liver,
gallbladder and bile ducts.

Pancreas: Normal

Spleen: Normal

Adrenals/Urinary Tract: The adrenals and kidneys are normal in
appearance. There is no urinary calculus evident. There is no
hydronephrosis or ureteral dilatation. Collecting systems and
ureters appear unremarkable.

Stomach/Bowel: There are normal appearances of the stomach, small
bowel and colon. The appendix is normal.

Vascular/Lymphatic: The abdominal aorta is normal in caliber. There
is no atherosclerotic calcification. There is no adenopathy in the
abdomen or pelvis.

Reproductive: The uterus and left ovary are absent. The right ovary
contains a partially collapsed 2 cm cyst.

Other: No acute inflammatory changes are evident in the abdomen or
pelvis. There is no ascites.

Musculoskeletal: No significant musculoskeletal lesions.
Incidentally noted small fat containing umbilical hernia.
IMPRESSION: Partially collapsed 2 cm right ovarian cyst. No acute inflammatory
changes are evident in the abdomen or pelvis. The bowel and solid
parenchymal organs appear unremarkable.

## 2018-05-12 DIAGNOSIS — Z8616 Personal history of COVID-19: Secondary | ICD-10-CM

## 2018-05-12 HISTORY — DX: Personal history of COVID-19: Z86.16

## 2018-06-05 ENCOUNTER — Emergency Department (HOSPITAL_COMMUNITY): Payer: BLUE CROSS/BLUE SHIELD

## 2018-06-05 ENCOUNTER — Emergency Department (HOSPITAL_COMMUNITY)
Admission: EM | Admit: 2018-06-05 | Discharge: 2018-06-05 | Disposition: A | Discharge status: Home / Self Care | Payer: BLUE CROSS/BLUE SHIELD | Attending: Emergency Medicine | Admitting: Emergency Medicine

## 2018-06-05 ENCOUNTER — Encounter (HOSPITAL_COMMUNITY): Payer: Self-pay

## 2018-06-05 ENCOUNTER — Other Ambulatory Visit: Payer: Self-pay

## 2018-06-05 DIAGNOSIS — R197 Diarrhea, unspecified: Secondary | ICD-10-CM | POA: Insufficient documentation

## 2018-06-05 DIAGNOSIS — R1030 Lower abdominal pain, unspecified: Secondary | ICD-10-CM | POA: Diagnosis present

## 2018-06-05 DIAGNOSIS — R112 Nausea with vomiting, unspecified: Secondary | ICD-10-CM

## 2018-06-05 DIAGNOSIS — Z853 Personal history of malignant neoplasm of breast: Secondary | ICD-10-CM | POA: Insufficient documentation

## 2018-06-05 DIAGNOSIS — D270 Benign neoplasm of right ovary: Secondary | ICD-10-CM

## 2018-06-05 LAB — COMPREHENSIVE METABOLIC PANEL
ALK PHOS: 64 U/L (ref 38–126)
ALT: 44 U/L (ref 0–44)
AST: 47 U/L — AB (ref 15–41)
Albumin: 4.5 g/dL (ref 3.5–5.0)
Anion gap: 12 (ref 5–15)
BUN: 7 mg/dL (ref 6–20)
CALCIUM: 9.7 mg/dL (ref 8.9–10.3)
CO2: 21 mmol/L — AB (ref 22–32)
Chloride: 105 mmol/L (ref 98–111)
Creatinine, Ser: 0.61 mg/dL (ref 0.44–1.00)
Glucose, Bld: 104 mg/dL — ABNORMAL HIGH (ref 70–99)
Potassium: 3.6 mmol/L (ref 3.5–5.1)
Sodium: 138 mmol/L (ref 135–145)
Total Bilirubin: 0.7 mg/dL (ref 0.3–1.2)
Total Protein: 7.8 g/dL (ref 6.5–8.1)

## 2018-06-05 LAB — CBC WITH DIFFERENTIAL/PLATELET
ABS IMMATURE GRANULOCYTES: 0.02 10*3/uL (ref 0.00–0.07)
Basophils Absolute: 0 10*3/uL (ref 0.0–0.1)
Basophils Relative: 0 %
Eosinophils Absolute: 0 10*3/uL (ref 0.0–0.5)
Eosinophils Relative: 0 %
HEMATOCRIT: 38.6 % (ref 36.0–46.0)
HEMOGLOBIN: 12.3 g/dL (ref 12.0–15.0)
IMMATURE GRANULOCYTES: 0 %
LYMPHS ABS: 1.8 10*3/uL (ref 0.7–4.0)
LYMPHS PCT: 22 %
MCH: 27.8 pg (ref 26.0–34.0)
MCHC: 31.9 g/dL (ref 30.0–36.0)
MCV: 87.1 fL (ref 80.0–100.0)
MONO ABS: 0.6 10*3/uL (ref 0.1–1.0)
MONOS PCT: 8 %
Neutro Abs: 5.5 10*3/uL (ref 1.7–7.7)
Neutrophils Relative %: 70 %
Platelets: 287 10*3/uL (ref 150–400)
RBC: 4.43 MIL/uL (ref 3.87–5.11)
RDW: 13.5 % (ref 11.5–15.5)
WBC: 8 10*3/uL (ref 4.0–10.5)
nRBC: 0 % (ref 0.0–0.2)

## 2018-06-05 LAB — URINALYSIS, ROUTINE W REFLEX MICROSCOPIC
BILIRUBIN URINE: NEGATIVE
Bacteria, UA: NONE SEEN
GLUCOSE, UA: NEGATIVE mg/dL
Hgb urine dipstick: NEGATIVE
Ketones, ur: 20 mg/dL — AB
Leukocytes, UA: NEGATIVE
Nitrite: NEGATIVE
PROTEIN: NEGATIVE mg/dL
Specific Gravity, Urine: 1.02 (ref 1.005–1.030)
pH: 6 (ref 5.0–8.0)

## 2018-06-05 LAB — LIPASE, BLOOD: LIPASE: 21 U/L (ref 11–51)

## 2018-06-05 MED ORDER — SODIUM CHLORIDE 0.9 % IV BOLUS
1000.0000 mL | Freq: Once | INTRAVENOUS | Status: AC
  Administered 2018-06-05: 1000 mL via INTRAVENOUS

## 2018-06-05 MED ORDER — MORPHINE SULFATE (PF) 4 MG/ML IV SOLN
4.0000 mg | Freq: Once | INTRAVENOUS | Status: AC
  Administered 2018-06-05: 4 mg via INTRAVENOUS
  Filled 2018-06-05: qty 1

## 2018-06-05 MED ORDER — KETOROLAC TROMETHAMINE 30 MG/ML IJ SOLN
30.0000 mg | Freq: Once | INTRAMUSCULAR | Status: AC
  Administered 2018-06-05: 30 mg via INTRAVENOUS
  Filled 2018-06-05: qty 1

## 2018-06-05 MED ORDER — ONDANSETRON 4 MG PO TBDP: 4.0000 mg | ORAL_TABLET | Freq: Three times a day (TID) | ORAL | 0 refills | Status: DC | PRN

## 2018-06-05 MED ORDER — ONDANSETRON HCL 4 MG/2ML IJ SOLN
4.0000 mg | Freq: Once | INTRAMUSCULAR | Status: AC
  Administered 2018-06-05: 4 mg via INTRAVENOUS
  Filled 2018-06-05: qty 2

## 2018-06-05 NOTE — ED Provider Notes (Signed)
[facility] EMERGENCY DEPARTMENT Provider Note   CSN: [id number] Arrival date & time: 06/05/18  1755     History   Chief Complaint Chief Complaint  Patient presents with  . Abdominal Pain    HPI Jessica Horn is a 38 y.o. female with a PMHx of remote breast cancer, fibromyalgia, ovarian cysts, chronic back pain, and other conditions listed below, with PSHx of partial hysterectomy and ovarian cystectomy (states she has only one ovary, but not sure which one), who presents to the ED with complaints of lower abdominal pain that began yesterday which feels similar to prior ovarian cyst pain.  She describes this pain as 8/10 constant stabbing nonradiating suprapubic abdominal pain that worsens with "everything" and has been unrelieved with ibuprofen, tizanidine, Tylenol, and Aleve.  She states that she had some nausea and 5 episodes of nonbloody nonbilious emesis but thinks that this was related to the pain, states that when she is in pain she typically will vomit.  She also reports having 3 episodes of nonbloody diarrhea.  She denies any fevers, chills, chest pain, shortness of breath, hematemesis, melena, hematochezia, constipation, obstipation, dysuria, hematuria, vaginal bleeding or discharge, myalgias, arthralgias, numbness, tingling, focal weakness, or any other complaints at this time.  She denies any recent travel, sick contacts, suspicious food intake, frequent NSAID use, or frequent alcohol use.  The history is provided by the patient and medical records. No language interpreter was used.  Abdominal Pain  Associated symptoms: diarrhea, nausea and vomiting   Associated symptoms: no chest pain, no chills, no constipation, no dysuria, no fever, no hematuria, no shortness of breath, no vaginal bleeding and no vaginal discharge     Past Medical History:  Diagnosis Date  . Back pain   . Breast cancer ([place])   . Cancer ([place])    breast CA at 38 y/o  . Cancer of spine  ([place])    spine CA for past 10 years  . Depression   . Fibromyalgia   . Ovarian cyst     Patient Active Problem List   Diagnosis Date Noted  . Cephalalgia 06/04/2015  . Medication overuse headache 06/04/2015  . Syncope and collapse 06/04/2015  . Anxiety and depression 01/10/2014  . Von Willebrand disease ([place]) 03/04/2013  . Breast cancer ([place]) 11/05/2012  . Gait disorder 08/09/2012  . Thoracic spine pain 08/09/2012  . Myofascial pain 08/09/2012  . Tremor 08/09/2012  . Cervical spine pain 08/09/2012  . Insomnia 06/11/2012  . Chronic pain of multiple sites 06/11/2012  . Coordination abnormal 06/11/2012  . Abnormal weight gain 06/11/2012  . URI 10/10/2009  . INSOMNIA 10/10/2009  . GASTROENTERITIS 10/20/2008  . OTHER MALAISE AND FATIGUE 07/03/2008  . GERD 06/30/2008  . ABDOMINAL PAIN 06/30/2008  . DEPRESSION 06/26/2008  . ALLERGIC RHINITIS 06/26/2008  . VILLONODULAR SYNOVITIS, HAND 06/26/2008  . WRIST PAIN, LEFT 06/26/2008  . Personal history of malignant neoplasm of breast 06/26/2008    Past Surgical History:  Procedure Laterality Date  . ABDOMINAL HYSTERECTOMY    . CESAREAN SECTION    . OVARIAN CYST REMOVAL    . TUBAL LIGATION       OB History    Gravida  3   Para  1   Term      Preterm  1   AB  2   Living  1     SAB  2   TAB      Ectopic      Multiple  Live Births  1            Home Medications    Prior to Admission medications   Medication Sig Start Date End Date Taking? Authorizing Provider  cyclobenzaprine (FLEXERIL) 5 MG tablet Take 1 tablet (5 mg total) by mouth 2 (two) times daily as needed for muscle spasms. Patient not taking: Reported on 03/04/2016 07/10/15   Glendell Docker, NP  HYDROcodone-acetaminophen (NORCO/VICODIN) 5-325 MG per tablet Take 1 tablet by mouth every 6 (six) hours as needed for moderate pain. Patient not taking: Reported on 03/04/2016 01/10/15   Bayard Hugger, NP  HYDROcodone-acetaminophen  (NORCO/VICODIN) 5-325 MG tablet Take 1 tablet by mouth every 6 (six) hours as needed for severe pain. Patient not taking: Reported on 03/04/2016 08/05/15   Zipporah Plants, MD  lidocaine (XYLOCAINE) 2 % solution Use as directed 20 mLs in the mouth or throat as needed for mouth pain. Patient not taking: Reported on 03/04/2016 06/23/15   Patel-Mills, Orvil Feil, PA-C  meloxicam (MOBIC) 7.5 MG tablet Take 1 tablet (7.5 mg total) by mouth daily. Patient not taking: Reported on 03/04/2016 02/05/16   Pattricia Boss, MD  metroNIDAZOLE (FLAGYL) 500 MG tablet Take 1 tablet (500 mg total) by mouth 2 (two) times daily. Patient not taking: Reported on 03/04/2016 08/05/15   Zipporah Plants, MD  naproxen (NAPROSYN) 500 MG tablet Take 1 tablet (500 mg total) by mouth every 12 (twelve) hours as needed. Patient not taking: Reported on 03/04/2016 06/04/15   Pieter Partridge, DO  nortriptyline (PAMELOR) 25 MG capsule Take 1 capsule (25 mg total) by mouth at bedtime. Patient not taking: Reported on 03/04/2016 06/04/15   Pieter Partridge, DO  ondansetron (ZOFRAN ODT) 4 MG disintegrating tablet Take 1 tablet (4 mg total) by mouth every 8 (eight) hours as needed for nausea or vomiting. Patient not taking: Reported on 03/04/2016 07/10/15   Glendell Docker, NP  ondansetron (ZOFRAN ODT) 4 MG disintegrating tablet Take 1 tablet (4 mg total) by mouth every 8 (eight) hours as needed for nausea or vomiting. Patient not taking: Reported on 03/04/2016 08/05/15   Zipporah Plants, MD  traZODone (DESYREL) 50 MG tablet Take 1 tablet (50 mg total) by mouth at bedtime. Patient not taking: Reported on 03/04/2016 08/25/14   Meredith Staggers, MD    Family History Family History  Problem Relation Age of Onset  . Hypertension Mother   . Anemia Mother   . Kidney disease Maternal Grandmother   . Diabetes Maternal Grandmother   . Breast cancer Maternal Grandmother 78  . Diabetes Paternal Grandfather   . Breast cancer Sister 56  .  Breast cancer Paternal Aunt 35  . Diabetes Maternal Grandfather   . Cancer Paternal Grandmother        NOS  . Breast cancer Paternal Aunt 20  . Breast cancer Paternal Aunt   . Ovarian cancer Paternal Aunt   . Cancer Maternal Aunt        breast  . Lupus Other   . Multiple sclerosis Other     Social History Social History   Tobacco Use  . Smoking status: Never Smoker  . Smokeless tobacco: Never Used  Substance Use Topics  . Alcohol use: No    Alcohol/week: 1.0 standard drinks    Types: 1 Glasses of wine per week    Comment: occ  . Drug use: Yes    Types: Marijuana     Allergies   Patient has no known allergies.  Review of Systems Review of Systems  Constitutional: Negative for chills and fever.  Respiratory: Negative for shortness of breath.   Cardiovascular: Negative for chest pain.  Gastrointestinal: Positive for abdominal pain, diarrhea, nausea and vomiting. Negative for blood in stool and constipation.  Genitourinary: Negative for dysuria, hematuria, vaginal bleeding and vaginal discharge.  Musculoskeletal: Negative for arthralgias and myalgias.  Skin: Negative for color change.  Allergic/Immunologic: Negative for immunocompromised state.  Neurological: Negative for weakness and numbness.  Psychiatric/Behavioral: Negative for confusion.   All other systems reviewed and are negative for acute change except as noted in the HPI.    Physical Exam Updated Vital Signs BP 128/88 (BP Location: Right Arm)   Pulse 85   Temp 99.1 F (37.3 C)   Resp 16   Ht 5\' 3"  (1.6 m)   Wt 66 kg   LMP 02/16/2014   SpO2 100%   BMI 25.76 kg/m   Physical Exam Vitals signs and nursing note reviewed.  Constitutional:      General: She is not in acute distress.    Appearance: Normal appearance. She is well-developed. She is not toxic-appearing.     Comments: Afebrile, nontoxic, NAD  HENT:     Head: Normocephalic and atraumatic.  Eyes:     General:        Right eye: No  discharge.        Left eye: No discharge.     Conjunctiva/sclera: Conjunctivae normal.  Neck:     Musculoskeletal: Normal range of motion and neck supple.  Cardiovascular:     Rate and Rhythm: Normal rate and regular rhythm.     Pulses: Normal pulses.     Heart sounds: Normal heart sounds, S1 normal and S2 normal. No murmur. No friction rub. No gallop.   Pulmonary:     Effort: Pulmonary effort is normal. No respiratory distress.     Breath sounds: Normal breath sounds. No decreased breath sounds, wheezing, rhonchi or rales.  Abdominal:     General: Bowel sounds are normal. There is no distension.     Palpations: Abdomen is soft. Abdomen is not rigid.     Tenderness: There is abdominal tenderness in the suprapubic area. There is no right CVA tenderness, left CVA tenderness, guarding or rebound. Negative signs include Murphy's sign and McBurney's sign.     Comments: Soft, nondistended, +BS throughout, with mild suprapubic TTP, no r/g/r, neg murphy's, neg mcburney's, no CVA TTP   Genitourinary:    Comments: Pt declined Musculoskeletal: Normal range of motion.  Skin:    General: Skin is warm and dry.     Findings: No rash.  Neurological:     Mental Status: She is alert and oriented to person, place, and time.     Sensory: Sensation is intact. No sensory deficit.     Motor: Motor function is intact.  Psychiatric:        Mood and Affect: Mood and affect normal.        Behavior: Behavior normal.      ED Treatments / Results  Labs (all labs ordered are listed, but only abnormal results are displayed) Labs Reviewed  COMPREHENSIVE METABOLIC PANEL - Abnormal; Notable for the following components:      Result Value   CO2 21 (*)    Glucose, Bld 104 (*)    AST 47 (*)    All other components within normal limits  URINALYSIS, ROUTINE W REFLEX MICROSCOPIC - Abnormal; Notable for the following components:  APPearance HAZY (*)    Ketones, ur 20 (*)    All other components within normal  limits  CBC WITH DIFFERENTIAL/PLATELET  LIPASE, BLOOD    EKG None  Radiology US Transvaginal Non-ob  Result Date: 06/05/2018 CLINICAL DATA:  Pelvic pain beginning yesterday. Clinical suspicion for ovarian torsion. Previous hysterectomy and left oophorectomy. EXAM: TRANSABDOMINAL AND TRANSVAGINAL ULTRASOUND OF PELVIS DOPPLER ULTRASOUND OF OVARIES TECHNIQUE: Both transabdominal and transvaginal ultrasound examinations of the pelvis were performed. Transabdominal technique was performed for global imaging of the pelvis including uterus, ovaries, adnexal regions, and pelvic cul-de-sac. It was necessary to proceed with endovaginal exam following the transabdominal exam to visualize the ovaries and adnexa. Color and duplex Doppler ultrasound was utilized to evaluate blood flow to the ovaries. COMPARISON:  08/05/2015 FINDINGS: Uterus Measurements: Surgically absent. Endometrium Thickness: Surgically absent. Right ovary Measurements: 4.1 x 2.7 x 2.3 cm = volume: 12.9 mL. A small mass with both hyperechoic and hypoechoic components is seen measuring 1.8 x 1.5 cm, consistent with a small ovarian dermoid. Left ovary Measurements: Surgically absent.  No adnexal mass identified. Pulsed Doppler evaluation of the right ovary demonstrates normal low-resistance arterial and venous waveforms. Other findings No abnormal free fluid. IMPRESSION: 1.8 cm benign right ovarian dermoid. No sonographic evidence for ovarian torsion or other acute findings. Previous hysterectomy and left oophorectomy. Electronically Signed   By: Earle Gell M.D.   On: 06/05/2018 20:17   US Pelvis Complete  Result Date: 06/05/2018 CLINICAL DATA:  Pelvic pain beginning yesterday. Clinical suspicion for ovarian torsion. Previous hysterectomy and left oophorectomy. EXAM: TRANSABDOMINAL AND TRANSVAGINAL ULTRASOUND OF PELVIS DOPPLER ULTRASOUND OF OVARIES TECHNIQUE: Both transabdominal and transvaginal ultrasound examinations of the pelvis were  performed. Transabdominal technique was performed for global imaging of the pelvis including uterus, ovaries, adnexal regions, and pelvic cul-de-sac. It was necessary to proceed with endovaginal exam following the transabdominal exam to visualize the ovaries and adnexa. Color and duplex Doppler ultrasound was utilized to evaluate blood flow to the ovaries. COMPARISON:  08/05/2015 FINDINGS: Uterus Measurements: Surgically absent. Endometrium Thickness: Surgically absent. Right ovary Measurements: 4.1 x 2.7 x 2.3 cm = volume: 12.9 mL. A small mass with both hyperechoic and hypoechoic components is seen measuring 1.8 x 1.5 cm, consistent with a small ovarian dermoid. Left ovary Measurements: Surgically absent.  No adnexal mass identified. Pulsed Doppler evaluation of the right ovary demonstrates normal low-resistance arterial and venous waveforms. Other findings No abnormal free fluid. IMPRESSION: 1.8 cm benign right ovarian dermoid. No sonographic evidence for ovarian torsion or other acute findings. Previous hysterectomy and left oophorectomy. Electronically Signed   By: Earle Gell M.D.   On: 06/05/2018 20:17   [place] Art/ven Flow Abd Pelv Doppler  Result Date: 06/05/2018 CLINICAL DATA:  Pelvic pain beginning yesterday. Clinical suspicion for ovarian torsion. Previous hysterectomy and left oophorectomy. EXAM: TRANSABDOMINAL AND TRANSVAGINAL ULTRASOUND OF PELVIS DOPPLER ULTRASOUND OF OVARIES TECHNIQUE: Both transabdominal and transvaginal ultrasound examinations of the pelvis were performed. Transabdominal technique was performed for global imaging of the pelvis including uterus, ovaries, adnexal regions, and pelvic cul-de-sac. It was necessary to proceed with endovaginal exam following the transabdominal exam to visualize the ovaries and adnexa. Color and duplex Doppler ultrasound was utilized to evaluate blood flow to the ovaries. COMPARISON:  08/05/2015 FINDINGS: Uterus Measurements: Surgically absent. Endometrium  Thickness: Surgically absent. Right ovary Measurements: 4.1 x 2.7 x 2.3 cm = volume: 12.9 mL. A small mass with both hyperechoic and hypoechoic components is seen measuring 1.8 x 1.5  cm, consistent with a small ovarian dermoid. Left ovary Measurements: Surgically absent.  No adnexal mass identified. Pulsed Doppler evaluation of the right ovary demonstrates normal low-resistance arterial and venous waveforms. Other findings No abnormal free fluid. IMPRESSION: 1.8 cm benign right ovarian dermoid. No sonographic evidence for ovarian torsion or other acute findings. Previous hysterectomy and left oophorectomy. Electronically Signed   By: Earle Gell M.D.   On: 06/05/2018 20:17    Procedures Procedures (including critical care time)  Medications Ordered in ED Medications  sodium chloride 0.9 % bolus 1,000 mL (0 mLs Intravenous Stopped 06/05/18 2005)  ketorolac (TORADOL) 30 MG/ML injection 30 mg (30 mg Intravenous Given 06/05/18 1903)  ondansetron (ZOFRAN) injection 4 mg (4 mg Intravenous Given 06/05/18 1903)  morphine 4 MG/ML injection 4 mg (4 mg Intravenous Given 06/05/18 2058)     Initial Impression / Assessment and Plan / ED Course  I have reviewed the triage vital signs and the nursing notes.  Pertinent labs & imaging results that were available during my care of the patient were reviewed by me and considered in my medical decision making (see chart for details).     38 y.o. female here with lower abdominal pain that began yesterday.  Also reports nausea and vomiting from the pain, in addition to some diarrhea which was mild.  States this feels like prior ovarian cyst pain.  On exam, mild suprapubic abdominal tenderness, non-peritoneal.  Patient declines wanting a pelvic exam, will proceed with pelvic ultrasound to further evaluate her symptoms, as well as labs.  Will give pain medicine and nausea medicine and reassess shortly.  9:36 PM CBC w/diff WNL. CMP with marginally elevated AST 47 but  otherwise unremarkable. Lipase WNL. U/A without evidence of UTI. Pelvic U/S showing 1.8cm benign right ovarian dermoid cyst but otherwise no acute findings. This cyst could be the cause of her pain, vs gastroenteritis. Pt feeling better, tolerating PO well here. Will send home with zofran, advised BRAT diet, tylenol/motrin/home pain meds for pain control, and f/up with PCP/OBGYN for recheck. I explained the diagnosis and have given explicit precautions to return to the ER including for any other new or worsening symptoms. The patient understands and accepts the medical plan as it's been dictated and I have answered their questions. Discharge instructions concerning home care and prescriptions have been given. The patient is STABLE and is discharged to home in good condition.    Final Clinical Impressions(s) / ED Diagnoses   Final diagnoses:  Lower abdominal pain  Nausea vomiting and diarrhea  Dermoid cyst of right ovary    ED Discharge Orders         Ordered    ondansetron (ZOFRAN ODT) 4 MG disintegrating tablet  Every 8 hours PRN     06/05/18 [address] [place], [place] 06/05/18 2136    Fredia Sorrow, MD 06/06/18 1539

## 2018-06-05 NOTE — ED Triage Notes (Signed)
Pt from home w/ a c/o lower abdominal pain that began 2 days ago. The pain has been constant and progressively getting worse. The pain is "sharp and crampy, like getting stabbed." Pt has a hx of ovarian cysts and tumors, and uterine cysts. She states that the pain feels like when she used to get cysts and feels like it may have ruptured. The pain was tolerable with ibuprofen until today. She wants to make sure it's a cyst.

## 2018-06-05 NOTE — Discharge Instructions (Addendum)
Your ultrasound shows that you have a dermoid cyst on your right ovary which may be the cause of your pain. You also could have a viral gastroenteritis illness. Use zofran as prescribed, as needed for nausea. Alternate between tylenol and motrin as needed for pain or use any home pain medications you may have as needed for pain. May consider using over the counter tums, maalox, pepto bismol, or other over the counter remedies to help with symptoms. Stay well hydrated with small sips of fluids throughout the day. Follow a BRAT (banana-rice-applesauce-toast) diet as described below for the next 24-48 hours. The 'BRAT' diet is suggested, then progress to diet as tolerated as symptoms abate. Call your regular doctor if bloody stools, persistent diarrhea, vomiting, fever or abdominal pain. Follow up with your regular doctor/OBGYN in 1 week for recheck of symptoms. Return to ER for changing or worsening of symptoms.

## 2018-06-28 DIAGNOSIS — G894 Chronic pain syndrome: Secondary | ICD-10-CM | POA: Diagnosis not present

## 2018-06-28 DIAGNOSIS — M546 Pain in thoracic spine: Secondary | ICD-10-CM | POA: Diagnosis not present

## 2018-06-28 DIAGNOSIS — Z79891 Long term (current) use of opiate analgesic: Secondary | ICD-10-CM | POA: Diagnosis not present

## 2018-06-28 DIAGNOSIS — M47814 Spondylosis without myelopathy or radiculopathy, thoracic region: Secondary | ICD-10-CM | POA: Diagnosis not present

## 2018-06-28 DIAGNOSIS — M5134 Other intervertebral disc degeneration, thoracic region: Secondary | ICD-10-CM | POA: Diagnosis not present

## 2018-06-28 DIAGNOSIS — Z79899 Other long term (current) drug therapy: Secondary | ICD-10-CM | POA: Diagnosis not present

## 2018-08-01 ENCOUNTER — Other Ambulatory Visit: Payer: Self-pay

## 2018-08-01 ENCOUNTER — Encounter (HOSPITAL_BASED_OUTPATIENT_CLINIC_OR_DEPARTMENT_OTHER): Payer: Self-pay | Admitting: Emergency Medicine

## 2018-08-01 ENCOUNTER — Emergency Department (HOSPITAL_BASED_OUTPATIENT_CLINIC_OR_DEPARTMENT_OTHER)
Admission: EM | Admit: 2018-08-01 | Discharge: 2018-08-01 | Disposition: A | Discharge status: Home / Self Care | Payer: BLUE CROSS/BLUE SHIELD | Attending: Emergency Medicine | Admitting: Emergency Medicine

## 2018-08-01 DIAGNOSIS — R102 Pelvic and perineal pain: Secondary | ICD-10-CM | POA: Diagnosis not present

## 2018-08-01 DIAGNOSIS — N83209 Unspecified ovarian cyst, unspecified side: Secondary | ICD-10-CM | POA: Diagnosis not present

## 2018-08-01 DIAGNOSIS — N83299 Other ovarian cyst, unspecified side: Secondary | ICD-10-CM | POA: Diagnosis not present

## 2018-08-01 MED ORDER — KETOROLAC TROMETHAMINE 30 MG/ML IJ SOLN
30.0000 mg | Freq: Once | INTRAMUSCULAR | Status: AC
  Administered 2018-08-01: 30 mg via INTRAMUSCULAR
  Filled 2018-08-01: qty 1

## 2018-08-01 NOTE — ED Triage Notes (Signed)
R lower abd pain, hx of ovarian cyst.

## 2018-08-01 NOTE — ED Provider Notes (Signed)
[facility] EMERGENCY DEPARTMENT Provider Note   CSN: [id number] Arrival date & time: 08/01/18  8250    History   Chief Complaint Chief Complaint  Patient presents with  . Abdominal Pain    HPI Jessica Horn is a 38 y.o. female.     HPI Patient presents with right lower abdominal pain.  States she is been diagnosed with an ovarian cyst for this.  States that 3 weeks ago she was seen and [facility] and had an ultrasound.  States she has had continued pain.  States it hurts to stand.  Has a history of chronic back pain.  States she previously saw pain management but is now off opiates.  States she has not had any in around 2 months.  States she is been taking over-the-counter medicines that do not seem to help with the ovarian cyst pain.  No fevers.  No vaginal bleeding or discharge.  Previous total hysterectomy but only left one ovary.  No fevers.  No diarrhea or constipation.  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  She states 1 of her clients is from [place] and has been exposed to coronavirus.  Patient is self quarantining. Past Medical History:  Diagnosis Date  . Back pain   . Breast cancer ([place])   . Cancer ([place])    breast CA at 38 y/o  . Cancer of spine ([place])    spine CA for past 10 years  . Depression   . Fibromyalgia   . Ovarian cyst     Patient Active Problem List   Diagnosis Date Noted  . Cephalalgia 06/04/2015  . Medication overuse headache 06/04/2015  . Syncope and collapse 06/04/2015  . Anxiety and depression 01/10/2014  . Von Willebrand disease ([place]) 03/04/2013  . Breast cancer ([place]) 11/05/2012  . Gait disorder 08/09/2012  . Thoracic spine pain 08/09/2012  . Myofascial pain 08/09/2012  . Tremor 08/09/2012  . Cervical spine pain 08/09/2012  . Insomnia 06/11/2012  . Chronic pain of multiple sites 06/11/2012  . Coordination abnormal 06/11/2012  . Abnormal weight gain 06/11/2012  . URI 10/10/2009  . INSOMNIA  10/10/2009  . GASTROENTERITIS 10/20/2008  . OTHER MALAISE AND FATIGUE 07/03/2008  . GERD 06/30/2008  . ABDOMINAL PAIN 06/30/2008  . DEPRESSION 06/26/2008  . ALLERGIC RHINITIS 06/26/2008  . VILLONODULAR SYNOVITIS, HAND 06/26/2008  . WRIST PAIN, LEFT 06/26/2008  . Personal history of malignant neoplasm of breast 06/26/2008    Past Surgical History:  Procedure Laterality Date  . ABDOMINAL HYSTERECTOMY    . CESAREAN SECTION    . OVARIAN CYST REMOVAL    . TUBAL LIGATION       OB History    Gravida  3   Para  1   Term      Preterm  1   AB  2   Living  1     SAB  2   TAB      Ectopic      Multiple      Live Births  1            Home Medications    Prior to Admission medications   Medication Sig Start Date End Date Taking? Authorizing Provider  cyclobenzaprine (FLEXERIL) 5 MG tablet Take 1 tablet (5 mg total) by mouth 2 (two) times daily as needed for muscle spasms. Patient not taking: Reported on 03/04/2016 07/10/15   Glendell Docker, NP  HYDROcodone-acetaminophen (NORCO/VICODIN) 5-325 MG per tablet Take  1 tablet by mouth every 6 (six) hours as needed for moderate pain. Patient not taking: Reported on 03/04/2016 01/10/15   Bayard Hugger, NP  HYDROcodone-acetaminophen (NORCO/VICODIN) 5-325 MG tablet Take 1 tablet by mouth every 6 (six) hours as needed for severe pain. Patient not taking: Reported on 03/04/2016 08/05/15   Zipporah Plants, MD  lidocaine (XYLOCAINE) 2 % solution Use as directed 20 mLs in the mouth or throat as needed for mouth pain. Patient not taking: Reported on 03/04/2016 06/23/15   Patel-Mills, Orvil Feil, PA-C  meloxicam (MOBIC) 7.5 MG tablet Take 1 tablet (7.5 mg total) by mouth daily. Patient not taking: Reported on 03/04/2016 02/05/16   Pattricia Boss, MD  metroNIDAZOLE (FLAGYL) 500 MG tablet Take 1 tablet (500 mg total) by mouth 2 (two) times daily. Patient not taking: Reported on 03/04/2016 08/05/15   Zipporah Plants, MD  naproxen  (NAPROSYN) 500 MG tablet Take 1 tablet (500 mg total) by mouth every 12 (twelve) hours as needed. Patient not taking: Reported on 03/04/2016 06/04/15   Pieter Partridge, DO  nortriptyline (PAMELOR) 25 MG capsule Take 1 capsule (25 mg total) by mouth at bedtime. Patient not taking: Reported on 03/04/2016 06/04/15   Pieter Partridge, DO  ondansetron (ZOFRAN ODT) 4 MG disintegrating tablet Take 1 tablet (4 mg total) by mouth every 8 (eight) hours as needed for nausea or vomiting. Patient not taking: Reported on 03/04/2016 07/10/15   Glendell Docker, NP  ondansetron (ZOFRAN ODT) 4 MG disintegrating tablet Take 1 tablet (4 mg total) by mouth every 8 (eight) hours as needed for nausea or vomiting. Patient not taking: Reported on 03/04/2016 08/05/15   Zipporah Plants, MD  ondansetron (ZOFRAN ODT) 4 MG disintegrating tablet Take 1 tablet (4 mg total) by mouth every 8 (eight) hours as needed for nausea or vomiting. 06/05/18   Street, [place], PA-C  traZODone (DESYREL) 50 MG tablet Take 1 tablet (50 mg total) by mouth at bedtime. Patient not taking: Reported on 03/04/2016 08/25/14   Meredith Staggers, MD    Family History Family History  Problem Relation Age of Onset  . Hypertension Mother   . Anemia Mother   . Kidney disease Maternal Grandmother   . Diabetes Maternal Grandmother   . Breast cancer Maternal Grandmother 78  . Diabetes Paternal Grandfather   . Breast cancer Sister 54  . Breast cancer Paternal Aunt 55  . Diabetes Maternal Grandfather   . Cancer Paternal Grandmother        NOS  . Breast cancer Paternal Aunt 73  . Breast cancer Paternal Aunt   . Ovarian cancer Paternal Aunt   . Cancer Maternal Aunt        breast  . Lupus Other   . Multiple sclerosis Other     Social History Social History   Tobacco Use  . Smoking status: Never Smoker  . Smokeless tobacco: Never Used  Substance Use Topics  . Alcohol use: No    Alcohol/week: 1.0 standard drinks    Types: 1 Glasses of wine per  week    Comment: occ  . Drug use: Yes    Types: Marijuana     Allergies   Patient has no known allergies.   Review of Systems Review of Systems  Constitutional: Negative for appetite change.  Respiratory: Negative for shortness of breath.   Cardiovascular: Negative for chest pain.  Gastrointestinal: Positive for abdominal pain.  Genitourinary: Positive for pelvic pain. Negative for dysuria, vaginal discharge and  vaginal pain.  Musculoskeletal: Positive for back pain.  Skin: Negative for rash.  Neurological: Negative for weakness.  Psychiatric/Behavioral: Negative for confusion.     Physical Exam Updated Vital Signs BP (!) 136/97 (BP Location: Left Arm)   Pulse 100   Temp 98.6 F (37 C) (Oral)   Resp 16   Ht 5\' 3"  (1.6 m)   Wt 63.5 kg   LMP 02/16/2014   SpO2 99%   BMI 24.80 kg/m   Physical Exam Vitals signs and nursing note reviewed.  HENT:     Head: Atraumatic.  Cardiovascular:     Rate and Rhythm: Normal rate.  Abdominal:     Tenderness: There is no abdominal tenderness.     Hernia: No hernia is present.  Genitourinary:    Comments: Deferred by patient. Skin:    General: Skin is warm.     Capillary Refill: Capillary refill takes less than 2 seconds.  Neurological:     Mental Status: She is alert.      ED Treatments / Results  Labs (all labs ordered are listed, but only abnormal results are displayed) Labs Reviewed - No data to display  EKG None  Radiology No results found.  Procedures Procedures (including critical care time)  Medications Ordered in ED Medications  ketorolac (TORADOL) 30 MG/ML injection 30 mg (has no administration in time range)     Initial Impression / Assessment and Plan / ED Course  I have reviewed the triage vital signs and the nursing notes.  Pertinent labs & imaging results that were available during my care of the patient were reviewed by me and considered in my medical decision making (see chart for details).         Patient with pelvic pain.  Has had ovarian cyst.  Records reviewed.  Patient states she is been taking over-the-counter medicines.  States she has been off her narcotic medicines for 2 months, however drug database shows that she had a month supply of 120 pills filled 3 weeks ago.  Patient states she does not want any narcotic medicines here because she has to drive and states she does not want any for home because she wants to be able to function.  Will give shot of Toradol.  Patient has no respiratory symptoms but she was worried about a coronavirus exposure.  No testing indicated at this time.  Patient will follow-up with her pain management doctor for further treatment of her pelvic pain.  Given shot of Toradol here to help with symptoms.  Final Clinical Impressions(s) / ED Diagnoses   Final diagnoses:  Pelvic pain  Cyst of ovary, unspecified laterality    ED Discharge Orders    None       Davonna Belling, MD 08/01/18 [phone]

## 2018-09-06 DIAGNOSIS — M47814 Spondylosis without myelopathy or radiculopathy, thoracic region: Secondary | ICD-10-CM | POA: Diagnosis not present

## 2018-09-06 DIAGNOSIS — M546 Pain in thoracic spine: Secondary | ICD-10-CM | POA: Diagnosis not present

## 2018-09-06 DIAGNOSIS — M5134 Other intervertebral disc degeneration, thoracic region: Secondary | ICD-10-CM | POA: Diagnosis not present

## 2018-09-06 DIAGNOSIS — G894 Chronic pain syndrome: Secondary | ICD-10-CM | POA: Diagnosis not present

## 2018-09-22 DIAGNOSIS — M5134 Other intervertebral disc degeneration, thoracic region: Secondary | ICD-10-CM | POA: Diagnosis not present

## 2018-09-22 DIAGNOSIS — Z79899 Other long term (current) drug therapy: Secondary | ICD-10-CM | POA: Diagnosis not present

## 2018-09-22 DIAGNOSIS — M47814 Spondylosis without myelopathy or radiculopathy, thoracic region: Secondary | ICD-10-CM | POA: Diagnosis not present

## 2018-09-22 DIAGNOSIS — G894 Chronic pain syndrome: Secondary | ICD-10-CM | POA: Diagnosis not present

## 2018-09-22 DIAGNOSIS — Z79891 Long term (current) use of opiate analgesic: Secondary | ICD-10-CM | POA: Diagnosis not present

## 2019-08-18 ENCOUNTER — Ambulatory Visit: Payer: Self-pay | Attending: Internal Medicine

## 2019-08-18 DIAGNOSIS — Z23 Encounter for immunization: Secondary | ICD-10-CM

## 2019-08-18 NOTE — Progress Notes (Signed)
   Covid-19 Vaccination Clinic  Name:  Kynslee Betsch    MRN: [id number] DOB: May 27, 1980  08/18/2019  Ms. Gavilanes was observed post Covid-19 immunization for 15 minutes without incident. She was provided with Vaccine Information Sheet and instruction to access the V-Safe system.   Ms. Whitby was instructed to call 911 with any severe reactions post vaccine: Marland Kitchen Difficulty breathing  . Swelling of face and throat  . A fast heartbeat  . A bad rash all over body  . Dizziness and weakness   Immunizations Administered    Name Date Dose VIS Date Route   Pfizer COVID-19 Vaccine 08/18/2019  9:20 AM 0.3 mL 04/22/2019 Intramuscular   Manufacturer: [employer]   Lot: [id number]   [place]: [id number]

## 2019-08-25 ENCOUNTER — Telehealth: Payer: Self-pay | Admitting: Hematology and Oncology

## 2019-08-25 NOTE — Telephone Encounter (Signed)
Received a new hem referral from Dr. Eleanora Neighbor for consult prior to dental surgery due to bleeding risk/Von Willebrand's disease. Pt returned my call and has been scheduled to see Dr. Lindi Adie on 4/29 at 345pm. Pt aware to arrive 15 minutes early.

## 2019-09-08 ENCOUNTER — Inpatient Hospital Stay: Payer: 59 | Attending: Hematology and Oncology | Admitting: Hematology and Oncology

## 2019-09-13 ENCOUNTER — Ambulatory Visit: Payer: 59 | Attending: Internal Medicine

## 2019-09-13 DIAGNOSIS — Z23 Encounter for immunization: Secondary | ICD-10-CM

## 2019-09-13 NOTE — Progress Notes (Signed)
   Covid-19 Vaccination Clinic  Name:  Dorie Weimerskirch    MRN: [id number] DOB: 28-Jan-1981  09/13/2019  Ms. Garrow was observed post Covid-19 immunization for 15 minutes without incident. She was provided with Vaccine Information Sheet and instruction to access the V-Safe system.   Ms. Wiswell was instructed to call 911 with any severe reactions post vaccine: Marland Kitchen Difficulty breathing  . Swelling of face and throat  . A fast heartbeat  . A bad rash all over body  . Dizziness and weakness   Immunizations Administered    Name Date Dose VIS Date Route   Pfizer COVID-19 Vaccine 09/13/2019  8:13 AM 0.3 mL 07/06/2018 Intramuscular   Manufacturer: [employer]   Lot: [id number]   [place]: [id number]

## 2020-01-18 DIAGNOSIS — M47814 Spondylosis without myelopathy or radiculopathy, thoracic region: Secondary | ICD-10-CM | POA: Diagnosis not present

## 2020-01-18 DIAGNOSIS — Z79899 Other long term (current) drug therapy: Secondary | ICD-10-CM | POA: Diagnosis not present

## 2020-01-18 DIAGNOSIS — G894 Chronic pain syndrome: Secondary | ICD-10-CM | POA: Diagnosis not present

## 2020-01-18 DIAGNOSIS — Z79891 Long term (current) use of opiate analgesic: Secondary | ICD-10-CM | POA: Diagnosis not present

## 2020-01-18 DIAGNOSIS — M5134 Other intervertebral disc degeneration, thoracic region: Secondary | ICD-10-CM | POA: Diagnosis not present

## 2020-01-18 DIAGNOSIS — M546 Pain in thoracic spine: Secondary | ICD-10-CM | POA: Diagnosis not present

## 2020-01-31 ENCOUNTER — Ambulatory Visit: Payer: Self-pay | Attending: Internal Medicine

## 2020-01-31 DIAGNOSIS — Z23 Encounter for immunization: Secondary | ICD-10-CM

## 2020-01-31 NOTE — Progress Notes (Signed)
   Covid-19 Vaccination Clinic  Name:  Jessica Horn    MRN: [id number] DOB: 1980-06-08  01/31/2020  Jessica Horn was observed post Covid-19 immunization for 15 minutes without incident. She was provided with Vaccine Information Sheet and instruction to access the V-Safe system.   Jessica Horn was instructed to call 911 with any severe reactions post vaccine: Marland Kitchen Difficulty breathing  . Swelling of face and throat  . A fast heartbeat  . A bad rash all over body  . Dizziness and weakness

## 2020-02-15 DIAGNOSIS — M5134 Other intervertebral disc degeneration, thoracic region: Secondary | ICD-10-CM | POA: Diagnosis not present

## 2020-02-15 DIAGNOSIS — Z79899 Other long term (current) drug therapy: Secondary | ICD-10-CM | POA: Diagnosis not present

## 2020-02-15 DIAGNOSIS — Z79891 Long term (current) use of opiate analgesic: Secondary | ICD-10-CM | POA: Diagnosis not present

## 2020-02-15 DIAGNOSIS — M47814 Spondylosis without myelopathy or radiculopathy, thoracic region: Secondary | ICD-10-CM | POA: Diagnosis not present

## 2020-02-15 DIAGNOSIS — M546 Pain in thoracic spine: Secondary | ICD-10-CM | POA: Diagnosis not present

## 2020-02-15 DIAGNOSIS — G894 Chronic pain syndrome: Secondary | ICD-10-CM | POA: Diagnosis not present

## 2020-03-26 DIAGNOSIS — Z79899 Other long term (current) drug therapy: Secondary | ICD-10-CM | POA: Diagnosis not present

## 2020-03-26 DIAGNOSIS — Z79891 Long term (current) use of opiate analgesic: Secondary | ICD-10-CM | POA: Diagnosis not present

## 2020-03-26 DIAGNOSIS — M5134 Other intervertebral disc degeneration, thoracic region: Secondary | ICD-10-CM | POA: Diagnosis not present

## 2020-03-26 DIAGNOSIS — M47814 Spondylosis without myelopathy or radiculopathy, thoracic region: Secondary | ICD-10-CM | POA: Diagnosis not present

## 2020-05-07 DIAGNOSIS — Z79891 Long term (current) use of opiate analgesic: Secondary | ICD-10-CM | POA: Diagnosis not present

## 2020-05-07 DIAGNOSIS — M47814 Spondylosis without myelopathy or radiculopathy, thoracic region: Secondary | ICD-10-CM | POA: Diagnosis not present

## 2020-05-07 DIAGNOSIS — M5134 Other intervertebral disc degeneration, thoracic region: Secondary | ICD-10-CM | POA: Diagnosis not present

## 2020-05-07 DIAGNOSIS — M549 Dorsalgia, unspecified: Secondary | ICD-10-CM | POA: Diagnosis not present

## 2020-05-07 DIAGNOSIS — Z79899 Other long term (current) drug therapy: Secondary | ICD-10-CM | POA: Diagnosis not present

## 2020-05-07 DIAGNOSIS — G894 Chronic pain syndrome: Secondary | ICD-10-CM | POA: Diagnosis not present

## 2020-05-09 DIAGNOSIS — F430 Acute stress reaction: Secondary | ICD-10-CM | POA: Diagnosis not present

## 2020-06-14 DIAGNOSIS — G894 Chronic pain syndrome: Secondary | ICD-10-CM | POA: Diagnosis not present

## 2020-06-14 DIAGNOSIS — Z79899 Other long term (current) drug therapy: Secondary | ICD-10-CM | POA: Diagnosis not present

## 2020-06-14 DIAGNOSIS — Z79891 Long term (current) use of opiate analgesic: Secondary | ICD-10-CM | POA: Diagnosis not present

## 2020-06-14 DIAGNOSIS — M47814 Spondylosis without myelopathy or radiculopathy, thoracic region: Secondary | ICD-10-CM | POA: Diagnosis not present

## 2020-06-14 DIAGNOSIS — M5134 Other intervertebral disc degeneration, thoracic region: Secondary | ICD-10-CM | POA: Diagnosis not present

## 2020-06-14 DIAGNOSIS — M546 Pain in thoracic spine: Secondary | ICD-10-CM | POA: Diagnosis not present

## 2020-07-10 DIAGNOSIS — M546 Pain in thoracic spine: Secondary | ICD-10-CM | POA: Diagnosis not present

## 2020-07-10 DIAGNOSIS — M47814 Spondylosis without myelopathy or radiculopathy, thoracic region: Secondary | ICD-10-CM | POA: Diagnosis not present

## 2020-07-10 DIAGNOSIS — Z79891 Long term (current) use of opiate analgesic: Secondary | ICD-10-CM | POA: Diagnosis not present

## 2020-07-10 DIAGNOSIS — M5134 Other intervertebral disc degeneration, thoracic region: Secondary | ICD-10-CM | POA: Diagnosis not present

## 2020-07-10 DIAGNOSIS — Z79899 Other long term (current) drug therapy: Secondary | ICD-10-CM | POA: Diagnosis not present

## 2020-07-10 DIAGNOSIS — G894 Chronic pain syndrome: Secondary | ICD-10-CM | POA: Diagnosis not present

## 2020-08-07 DIAGNOSIS — Z79891 Long term (current) use of opiate analgesic: Secondary | ICD-10-CM | POA: Diagnosis not present

## 2020-08-07 DIAGNOSIS — M47814 Spondylosis without myelopathy or radiculopathy, thoracic region: Secondary | ICD-10-CM | POA: Diagnosis not present

## 2020-08-07 DIAGNOSIS — M5134 Other intervertebral disc degeneration, thoracic region: Secondary | ICD-10-CM | POA: Diagnosis not present

## 2020-08-07 DIAGNOSIS — Z79899 Other long term (current) drug therapy: Secondary | ICD-10-CM | POA: Diagnosis not present

## 2020-08-07 DIAGNOSIS — G894 Chronic pain syndrome: Secondary | ICD-10-CM | POA: Diagnosis not present

## 2020-08-07 DIAGNOSIS — M549 Dorsalgia, unspecified: Secondary | ICD-10-CM | POA: Diagnosis not present

## 2020-09-06 DIAGNOSIS — M47814 Spondylosis without myelopathy or radiculopathy, thoracic region: Secondary | ICD-10-CM | POA: Diagnosis not present

## 2020-09-06 DIAGNOSIS — Z79899 Other long term (current) drug therapy: Secondary | ICD-10-CM | POA: Diagnosis not present

## 2020-09-06 DIAGNOSIS — M5134 Other intervertebral disc degeneration, thoracic region: Secondary | ICD-10-CM | POA: Diagnosis not present

## 2020-09-06 DIAGNOSIS — G894 Chronic pain syndrome: Secondary | ICD-10-CM | POA: Diagnosis not present

## 2020-09-06 DIAGNOSIS — Z79891 Long term (current) use of opiate analgesic: Secondary | ICD-10-CM | POA: Diagnosis not present

## 2020-10-09 DIAGNOSIS — M47814 Spondylosis without myelopathy or radiculopathy, thoracic region: Secondary | ICD-10-CM | POA: Diagnosis not present

## 2020-10-09 DIAGNOSIS — M5134 Other intervertebral disc degeneration, thoracic region: Secondary | ICD-10-CM | POA: Diagnosis not present

## 2020-10-09 DIAGNOSIS — G894 Chronic pain syndrome: Secondary | ICD-10-CM | POA: Diagnosis not present

## 2020-11-07 ENCOUNTER — Encounter (HOSPITAL_COMMUNITY): Payer: Self-pay | Admitting: Emergency Medicine

## 2020-11-07 ENCOUNTER — Emergency Department (HOSPITAL_COMMUNITY)
Admission: EM | Admit: 2020-11-07 | Discharge: 2020-11-08 | Disposition: A | Discharge status: Home / Self Care | Payer: BC Managed Care – PPO | Attending: Emergency Medicine | Admitting: Emergency Medicine

## 2020-11-07 ENCOUNTER — Other Ambulatory Visit: Payer: Self-pay

## 2020-11-07 DIAGNOSIS — Z853 Personal history of malignant neoplasm of breast: Secondary | ICD-10-CM | POA: Insufficient documentation

## 2020-11-07 DIAGNOSIS — R52 Pain, unspecified: Secondary | ICD-10-CM

## 2020-11-07 DIAGNOSIS — R112 Nausea with vomiting, unspecified: Secondary | ICD-10-CM | POA: Diagnosis not present

## 2020-11-07 DIAGNOSIS — R102 Pelvic and perineal pain: Secondary | ICD-10-CM | POA: Diagnosis not present

## 2020-11-07 DIAGNOSIS — R1030 Lower abdominal pain, unspecified: Secondary | ICD-10-CM | POA: Diagnosis not present

## 2020-11-07 DIAGNOSIS — R109 Unspecified abdominal pain: Secondary | ICD-10-CM

## 2020-11-07 LAB — COMPREHENSIVE METABOLIC PANEL
ALT: 10 U/L (ref 0–44)
AST: 19 U/L (ref 15–41)
Albumin: 4.4 g/dL (ref 3.5–5.0)
Alkaline Phosphatase: 50 U/L (ref 38–126)
Anion gap: 11 (ref 5–15)
BUN: 10 mg/dL (ref 6–20)
CO2: 20 mmol/L — ABNORMAL LOW (ref 22–32)
Calcium: 9.4 mg/dL (ref 8.9–10.3)
Chloride: 105 mmol/L (ref 98–111)
Creatinine, Ser: 0.63 mg/dL (ref 0.44–1.00)
GFR, Estimated: >60 mL/min (ref >60)
Glucose, Bld: 99 mg/dL (ref 70–99)
Potassium: 3.4 mmol/L — ABNORMAL LOW (ref 3.5–5.1)
Sodium: 136 mmol/L (ref 135–145)
Total Bilirubin: 0.7 mg/dL (ref 0.3–1.2)
Total Protein: 7.5 g/dL (ref 6.5–8.1)

## 2020-11-07 LAB — CBC
HCT: 38.7 % (ref 36.0–46.0)
Hemoglobin: 12.2 g/dL (ref 12.0–15.0)
MCH: 28.4 pg (ref 26.0–34.0)
MCHC: 31.5 g/dL (ref 30.0–36.0)
MCV: 90.2 fL (ref 80.0–100.0)
Platelets: 388 10*3/uL (ref 150–400)
RBC: 4.29 MIL/uL (ref 3.87–5.11)
RDW: 13 % (ref 11.5–15.5)
WBC: 7.6 10*3/uL (ref 4.0–10.5)
nRBC: 0 % (ref 0.0–0.2)

## 2020-11-07 LAB — URINALYSIS, ROUTINE W REFLEX MICROSCOPIC
Bilirubin Urine: NEGATIVE
Glucose, UA: NEGATIVE mg/dL
Hgb urine dipstick: NEGATIVE
Ketones, ur: 20 mg/dL — AB
Leukocytes,Ua: NEGATIVE
Nitrite: NEGATIVE
Protein, ur: 30 mg/dL — AB
Specific Gravity, Urine: 1.028 (ref 1.005–1.030)
pH: 5 (ref 5.0–8.0)

## 2020-11-07 LAB — I-STAT BETA HCG BLOOD, ED (MC, WL, AP ONLY): I-stat hCG, quantitative: <5 m[IU]/mL (ref <5)

## 2020-11-07 LAB — LIPASE, BLOOD: Lipase: 19 U/L (ref 11–51)

## 2020-11-07 MED ORDER — KETOROLAC TROMETHAMINE 30 MG/ML IJ SOLN
30.0000 mg | Freq: Once | INTRAMUSCULAR | Status: AC
  Administered 2020-11-08: 30 mg via INTRAMUSCULAR
  Filled 2020-11-07: qty 1

## 2020-11-07 MED ORDER — ACETAMINOPHEN 325 MG PO TABS
650.0000 mg | ORAL_TABLET | Freq: Once | ORAL | Status: AC
  Administered 2020-11-07: 650 mg via ORAL
  Filled 2020-11-07: qty 2

## 2020-11-07 NOTE — ED Provider Notes (Signed)
[facility] EMERGENCY DEPARTMENT Provider Note   CSN: [id number] Arrival date & time: 11/07/20  1925     History Chief Complaint  Patient presents with   Abdominal Pain    Jessica Horn is a 40 y.o. female.  The history is provided by the patient.  Abdominal Pain Pain location:  Suprapubic Pain quality: sharp   Pain radiates to:  Does not radiate Pain severity:  Severe Onset quality:  Gradual Duration:  1 week Timing:  Constant Progression:  Unchanged Chronicity:  Recurrent Context: not sick contacts   Relieved by:  Nothing Worsened by:  Nothing Associated symptoms: nausea and vomiting   Associated symptoms: no anorexia, no constipation, no diarrhea, no dysuria and no fever       Past Medical History:  Diagnosis Date   Back pain    Breast cancer ([place])    Cancer ([place])    breast CA at 40 y/o   Cancer of spine ([place])    spine CA for past 10 years   Depression    Fibromyalgia    Ovarian cyst     Patient Active Problem List   Diagnosis Date Noted   Cephalalgia 06/04/2015   Medication overuse headache 06/04/2015   Syncope and collapse 06/04/2015   Anxiety and depression 01/10/2014   Von Willebrand disease ([place]) 03/04/2013   Breast cancer ([place]) 11/05/2012   Gait disorder 08/09/2012   Thoracic spine pain 08/09/2012   Myofascial pain 08/09/2012   Tremor 08/09/2012   Cervical spine pain 08/09/2012   Insomnia 06/11/2012   Chronic pain of multiple sites 06/11/2012   Coordination abnormal 06/11/2012   Abnormal weight gain 06/11/2012   URI 10/10/2009   INSOMNIA 10/10/2009   GASTROENTERITIS 10/20/2008   OTHER MALAISE AND FATIGUE 07/03/2008   GERD 06/30/2008   ABDOMINAL PAIN 06/30/2008   DEPRESSION 06/26/2008   ALLERGIC RHINITIS 06/26/2008   VILLONODULAR SYNOVITIS, HAND 06/26/2008   WRIST PAIN, LEFT 06/26/2008   Personal history of malignant neoplasm of breast 06/26/2008    Past Surgical History:  Procedure Laterality Date    ABDOMINAL HYSTERECTOMY     CESAREAN SECTION     OVARIAN CYST REMOVAL     TUBAL LIGATION       OB History     Gravida  3   Para  1   Term      Preterm  1   AB  2   Living  1      SAB  2   IAB      Ectopic      Multiple      Live Births  1           Family History  Problem Relation Age of Onset   Hypertension Mother    Anemia Mother    Kidney disease Maternal Grandmother    Diabetes Maternal Grandmother    Breast cancer Maternal Grandmother 78   Diabetes Paternal Grandfather    Breast cancer Sister 47   Breast cancer Paternal Aunt 25   Diabetes Maternal Grandfather    Cancer Paternal Grandmother        NOS   Breast cancer Paternal Aunt 41   Breast cancer Paternal Aunt    Ovarian cancer Paternal Aunt    Cancer Maternal Aunt        breast   Lupus Other    Multiple sclerosis Other     Social History   Tobacco Use   Smoking status: Never   Smokeless tobacco:  Never  Substance Use Topics   Alcohol use: No    Alcohol/week: 1.0 standard drink    Types: 1 Glasses of wine per week    Comment: occ   Drug use: Yes    Types: Marijuana    Home Medications Prior to Admission medications   Medication Sig Start Date End Date Taking? Authorizing Provider  cyclobenzaprine (FLEXERIL) 5 MG tablet Take 1 tablet (5 mg total) by mouth 2 (two) times daily as needed for muscle spasms. Patient not taking: Reported on 03/04/2016 07/10/15   Glendell Docker, NP  HYDROcodone-acetaminophen (NORCO/VICODIN) 5-325 MG per tablet Take 1 tablet by mouth every 6 (six) hours as needed for moderate pain. Patient not taking: Reported on 03/04/2016 01/10/15   Bayard Hugger, NP  HYDROcodone-acetaminophen (NORCO/VICODIN) 5-325 MG tablet Take 1 tablet by mouth every 6 (six) hours as needed for severe pain. Patient not taking: Reported on 03/04/2016 08/05/15   Zipporah Plants, MD  lidocaine (XYLOCAINE) 2 % solution Use as directed 20 mLs in the mouth or throat as needed for  mouth pain. Patient not taking: Reported on 03/04/2016 06/23/15   Patel-Mills, Orvil Feil, PA-C  meloxicam (MOBIC) 7.5 MG tablet Take 1 tablet (7.5 mg total) by mouth daily. Patient not taking: Reported on 03/04/2016 02/05/16   Pattricia Boss, MD  metroNIDAZOLE (FLAGYL) 500 MG tablet Take 1 tablet (500 mg total) by mouth 2 (two) times daily. Patient not taking: Reported on 03/04/2016 08/05/15   Zipporah Plants, MD  naproxen (NAPROSYN) 500 MG tablet Take 1 tablet (500 mg total) by mouth every 12 (twelve) hours as needed. Patient not taking: Reported on 03/04/2016 06/04/15   Pieter Partridge, DO  nortriptyline (PAMELOR) 25 MG capsule Take 1 capsule (25 mg total) by mouth at bedtime. Patient not taking: Reported on 03/04/2016 06/04/15   Pieter Partridge, DO  ondansetron (ZOFRAN ODT) 4 MG disintegrating tablet Take 1 tablet (4 mg total) by mouth every 8 (eight) hours as needed for nausea or vomiting. Patient not taking: Reported on 03/04/2016 07/10/15   Glendell Docker, NP  ondansetron (ZOFRAN ODT) 4 MG disintegrating tablet Take 1 tablet (4 mg total) by mouth every 8 (eight) hours as needed for nausea or vomiting. Patient not taking: Reported on 03/04/2016 08/05/15   Zipporah Plants, MD  ondansetron (ZOFRAN ODT) 4 MG disintegrating tablet Take 1 tablet (4 mg total) by mouth every 8 (eight) hours as needed for nausea or vomiting. 06/05/18   Street, [place], PA-C  traZODone (DESYREL) 50 MG tablet Take 1 tablet (50 mg total) by mouth at bedtime. Patient not taking: Reported on 03/04/2016 08/25/14   Meredith Staggers, MD    Allergies    Patient has no known allergies.  Review of Systems   Review of Systems  Constitutional:  Negative for fever.  HENT:  Negative for drooling.   Eyes:  Negative for redness.  Respiratory:  Negative for wheezing.   Cardiovascular:  Negative for leg swelling.  Gastrointestinal:  Positive for abdominal pain, nausea and vomiting. Negative for anorexia, constipation and  diarrhea.  Genitourinary:  Negative for dysuria.  Musculoskeletal:  Negative for arthralgias.  Neurological:  Negative for dizziness.  Psychiatric/Behavioral:  Negative for agitation.    Physical Exam Updated Vital Signs BP 140/90   Pulse 99   Temp 99.4 F (37.4 C) (Oral)   Resp 20   Ht 5\' 3"  (1.6 m)   Wt 65.8 kg   LMP 02/16/2014   SpO2 100%   BMI  25.69 kg/m   Physical Exam Vitals and nursing note reviewed.  Constitutional:      General: She is not in acute distress.    Appearance: Normal appearance.  HENT:     Head: Normocephalic and atraumatic.     Nose: Nose normal.  Eyes:     Conjunctiva/sclera: Conjunctivae normal.     Pupils: Pupils are equal, round, and reactive to light.  Cardiovascular:     Rate and Rhythm: Normal rate and regular rhythm.     Pulses: Normal pulses.     Heart sounds: Normal heart sounds.  Pulmonary:     Effort: Pulmonary effort is normal.     Breath sounds: Normal breath sounds.  Abdominal:     General: Abdomen is flat. Bowel sounds are normal.     Palpations: Abdomen is soft.     Tenderness: There is no abdominal tenderness. There is no guarding.  Musculoskeletal:        General: Normal range of motion.     Cervical back: Normal range of motion and neck supple.  Skin:    General: Skin is warm and dry.     Capillary Refill: Capillary refill takes less than 2 seconds.  Neurological:     General: No focal deficit present.     Mental Status: She is alert and oriented to person, place, and time.     Deep Tendon Reflexes: Reflexes normal.  Psychiatric:        Mood and Affect: Affect is angry.        Behavior: Behavior is aggressive.    ED Results / Procedures / Treatments   Labs (all labs ordered are listed, but only abnormal results are displayed) Results for orders placed or performed during the hospital encounter of 11/07/20  Lipase, blood  Result Value Ref Range   Lipase 19 11 - 51 U/L  Comprehensive metabolic panel  Result  Value Ref Range   Sodium 136 135 - 145 mmol/L   Potassium 3.4 (L) 3.5 - 5.1 mmol/L   Chloride 105 98 - 111 mmol/L   CO2 20 (L) 22 - 32 mmol/L   Glucose, Bld 99 70 - 99 mg/dL   BUN 10 6 - 20 mg/dL   Creatinine, Ser 0.63 0.44 - 1.00 mg/dL   Calcium 9.4 8.9 - 10.3 mg/dL   Total Protein 7.5 6.5 - 8.1 g/dL   Albumin 4.4 3.5 - 5.0 g/dL   AST 19 15 - 41 U/L   ALT 10 0 - 44 U/L   Alkaline Phosphatase 50 38 - 126 U/L   Total Bilirubin 0.7 0.3 - 1.2 mg/dL   GFR, Estimated >60 >60 mL/min   Anion gap 11 5 - 15  CBC  Result Value Ref Range   WBC 7.6 4.0 - 10.5 K/uL   RBC 4.29 3.87 - 5.11 MIL/uL   Hemoglobin 12.2 12.0 - 15.0 g/dL   HCT 38.7 36.0 - 46.0 %   MCV 90.2 80.0 - 100.0 fL   MCH 28.4 26.0 - 34.0 pg   MCHC 31.5 30.0 - 36.0 g/dL   RDW 13.0 11.5 - 15.5 %   Platelets 388 150 - 400 K/uL   nRBC 0.0 0.0 - 0.2 %  Urinalysis, Routine w reflex microscopic  Result Value Ref Range   Color, Urine YELLOW YELLOW   APPearance CLEAR CLEAR   Specific Gravity, Urine 1.028 1.005 - 1.030   pH 5.0 5.0 - 8.0   Glucose, UA NEGATIVE NEGATIVE mg/dL   Hgb urine  dipstick NEGATIVE NEGATIVE   Bilirubin Urine NEGATIVE NEGATIVE   Ketones, ur 20 (A) NEGATIVE mg/dL   Protein, ur 30 (A) NEGATIVE mg/dL   Nitrite NEGATIVE NEGATIVE   Leukocytes,Ua NEGATIVE NEGATIVE   RBC / HPF 0-5 0 - 5 RBC/hpf   WBC, UA 0-5 0 - 5 WBC/hpf   Bacteria, UA RARE (A) NONE SEEN   Squamous Epithelial / LPF 0-5 0 - 5   Mucus PRESENT    Hyaline Casts, UA PRESENT   I-Stat beta hCG blood, ED  Result Value Ref Range   I-stat hCG, quantitative <5.0 <5 mIU/mL   Comment 3           No results found.   Radiology No results found.  Procedures Procedures   Medications Ordered in ED Medications  ketorolac (TORADOL) 30 MG/ML injection 30 mg (has no administration in time range)  acetaminophen (TYLENOL) tablet 650 mg (650 mg Oral Given 11/07/20 2255)    ED Course  I have reviewed the triage vital signs and the nursing  notes.  Pertinent labs & imaging results that were available during my care of the patient were reviewed by me and considered in my medical decision making (see chart for details). Patient is angry she did not get [place] in triage.     No cysts no torsions.  Stable for discharge.    Already on narcotics per the [place].  Given toradol in the ED.  All labs and exam are normal.  Follow up with your pain management specialist.    Jessica Horn was evaluated in Emergency Department on 11/07/2020 for the symptoms described in the history of present illness. She was evaluated in the context of the global COVID-19 pandemic, which necessitated consideration that the patient might be at risk for infection with the SARS-CoV-2 virus that causes COVID-19. Institutional protocols and algorithms that pertain to the evaluation of patients at risk for COVID-19 are in a state of rapid change based on information released by regulatory bodies including the CDC and federal and state organizations. These policies and algorithms were followed during the patient's care in the ED.  Final Clinical Impression(s) / ED Diagnoses Final diagnoses:  Pain    Return for intractable cough, coughing up blood, fevers > 100.4 unrelieved by medication, shortness of breath, intractable vomiting, chest pain, shortness of breath, weakness, numbness, changes in speech, facial asymmetry, abdominal pain, passing out, Inability to tolerate liquids or food, cough, altered mental status or any concerns. No signs of systemic illness or infection. The patient is nontoxic-appearing on exam and vital signs are within normal limits. I have reviewed the triage vital signs and the nursing notes. Pertinent labs & imaging results that were available during my care of the patient were reviewed by me and considered in my medical decision making (see chart for details). After history, exam, and medical workup I feel the patient has been appropriately  medically screened and is safe for discharge home. Pertinent diagnoses were discussed with the patient. Patient was given return precautions.     Lavell Ridings, MD 11/08/20 [phone]

## 2020-11-07 NOTE — ED Provider Notes (Signed)
Emergency Medicine Provider Triage Evaluation Note  Florentine Diekman , a 40 y.o. female  was evaluated in triage.  Pt complains of lower abdominal pain with nausea and vomiting.  She states that she has a history of both cysts and ulcers on her ovary.  She only has 1 ovary, does not know which one.  The pain is in her midline lower abdomen.  She has been taking ibuprofen and Tylenol without relief of her symptoms..  Review of Systems  Positive: Lower abdominal pain, Nausea Negative: Dysuria, frequency, urgency  Physical Exam  BP (!) 141/102 (BP Location: Right Arm)   Pulse 97   Temp 98.6 F (37 C) (Oral)   Resp 18   Ht 5\' 3"  (1.6 m)   Wt 65.8 kg   LMP 02/16/2014   SpO2 100%   BMI 25.69 kg/m  Gen:   Awake, no distress   Resp:  Normal effort  MSK:   Moves extremities without difficulty  Other:  Patient is awake and alert.  Speech is nonslurred.  Answers questions appropriately without difficulty.  Medical Decision Making  Medically screening exam initiated at 8:20 PM.  Appropriate orders placed.  Madalynn Pickelsimer was informed that the remainder of the evaluation will be completed by another provider, this initial triage assessment does not replace that evaluation, and the importance of remaining in the ED until their evaluation is complete.  Note: Portions of this report may have been transcribed using voice recognition software. Every effort was made to ensure accuracy; however, inadvertent computerized transcription errors may be present    Ollen Gross 11/07/20 2021    Deno Etienne, DO 11/07/20 2131

## 2020-11-07 NOTE — ED Triage Notes (Signed)
Pt arrive POV from home c/o lower abd pain with nausea and vomiting, states it feels like when she have her ulcer.

## 2020-11-08 ENCOUNTER — Emergency Department (HOSPITAL_COMMUNITY): Payer: BC Managed Care – PPO

## 2020-11-08 ENCOUNTER — Encounter (HOSPITAL_COMMUNITY): Payer: Self-pay | Admitting: Emergency Medicine

## 2020-11-08 DIAGNOSIS — R102 Pelvic and perineal pain: Secondary | ICD-10-CM | POA: Diagnosis not present

## 2020-11-08 MED ORDER — NAPROXEN 375 MG PO TABS: 375.0000 mg | ORAL_TABLET | Freq: Two times a day (BID) | ORAL | 0 refills | Status: DC

## 2020-11-08 NOTE — ED Notes (Signed)
Patient verbalizes understanding of discharge instructions. Opportunity for questioning and answers were provided. Armband removed by staff, pt discharged from ED ambulatory.   

## 2020-11-08 NOTE — ED Notes (Signed)
Pt still reporting pain and seems agitated. This RN offered to ask the doctor for something else for pain prior to discharge in which pt  Refused and said that she did not want to wait any longer.

## 2020-11-08 NOTE — ED Notes (Signed)
Pt transported to [facility]

## 2020-11-13 DIAGNOSIS — M47814 Spondylosis without myelopathy or radiculopathy, thoracic region: Secondary | ICD-10-CM | POA: Diagnosis not present

## 2020-11-13 DIAGNOSIS — G894 Chronic pain syndrome: Secondary | ICD-10-CM | POA: Diagnosis not present

## 2020-11-13 DIAGNOSIS — M5134 Other intervertebral disc degeneration, thoracic region: Secondary | ICD-10-CM | POA: Diagnosis not present

## 2020-12-12 DIAGNOSIS — G894 Chronic pain syndrome: Secondary | ICD-10-CM | POA: Diagnosis not present

## 2020-12-12 DIAGNOSIS — Z79899 Other long term (current) drug therapy: Secondary | ICD-10-CM | POA: Diagnosis not present

## 2020-12-12 DIAGNOSIS — Z79891 Long term (current) use of opiate analgesic: Secondary | ICD-10-CM | POA: Diagnosis not present

## 2020-12-12 DIAGNOSIS — M5134 Other intervertebral disc degeneration, thoracic region: Secondary | ICD-10-CM | POA: Diagnosis not present

## 2020-12-12 DIAGNOSIS — M47814 Spondylosis without myelopathy or radiculopathy, thoracic region: Secondary | ICD-10-CM | POA: Diagnosis not present

## 2020-12-12 DIAGNOSIS — M546 Pain in thoracic spine: Secondary | ICD-10-CM | POA: Diagnosis not present

## 2021-01-16 DIAGNOSIS — M47814 Spondylosis without myelopathy or radiculopathy, thoracic region: Secondary | ICD-10-CM | POA: Diagnosis not present

## 2021-01-16 DIAGNOSIS — M546 Pain in thoracic spine: Secondary | ICD-10-CM | POA: Diagnosis not present

## 2021-01-16 DIAGNOSIS — G894 Chronic pain syndrome: Secondary | ICD-10-CM | POA: Diagnosis not present

## 2021-01-16 DIAGNOSIS — M5134 Other intervertebral disc degeneration, thoracic region: Secondary | ICD-10-CM | POA: Diagnosis not present

## 2021-02-14 DIAGNOSIS — M5134 Other intervertebral disc degeneration, thoracic region: Secondary | ICD-10-CM | POA: Diagnosis not present

## 2021-02-14 DIAGNOSIS — M47814 Spondylosis without myelopathy or radiculopathy, thoracic region: Secondary | ICD-10-CM | POA: Diagnosis not present

## 2021-02-14 DIAGNOSIS — G894 Chronic pain syndrome: Secondary | ICD-10-CM | POA: Diagnosis not present

## 2021-03-14 DIAGNOSIS — Z79899 Other long term (current) drug therapy: Secondary | ICD-10-CM | POA: Diagnosis not present

## 2021-03-14 DIAGNOSIS — Z79891 Long term (current) use of opiate analgesic: Secondary | ICD-10-CM | POA: Diagnosis not present

## 2021-03-14 DIAGNOSIS — M5134 Other intervertebral disc degeneration, thoracic region: Secondary | ICD-10-CM | POA: Diagnosis not present

## 2021-03-14 DIAGNOSIS — M549 Dorsalgia, unspecified: Secondary | ICD-10-CM | POA: Diagnosis not present

## 2021-03-14 DIAGNOSIS — G894 Chronic pain syndrome: Secondary | ICD-10-CM | POA: Diagnosis not present

## 2021-03-14 DIAGNOSIS — M47814 Spondylosis without myelopathy or radiculopathy, thoracic region: Secondary | ICD-10-CM | POA: Diagnosis not present

## 2021-04-11 DIAGNOSIS — G894 Chronic pain syndrome: Secondary | ICD-10-CM | POA: Diagnosis not present

## 2021-04-11 DIAGNOSIS — M5134 Other intervertebral disc degeneration, thoracic region: Secondary | ICD-10-CM | POA: Diagnosis not present

## 2021-04-11 DIAGNOSIS — M47814 Spondylosis without myelopathy or radiculopathy, thoracic region: Secondary | ICD-10-CM | POA: Diagnosis not present

## 2021-04-11 DIAGNOSIS — Z79899 Other long term (current) drug therapy: Secondary | ICD-10-CM | POA: Diagnosis not present

## 2021-04-11 DIAGNOSIS — Z79891 Long term (current) use of opiate analgesic: Secondary | ICD-10-CM | POA: Diagnosis not present

## 2021-04-11 DIAGNOSIS — M546 Pain in thoracic spine: Secondary | ICD-10-CM | POA: Diagnosis not present

## 2021-05-07 DIAGNOSIS — G894 Chronic pain syndrome: Secondary | ICD-10-CM | POA: Diagnosis not present

## 2021-05-07 DIAGNOSIS — M47814 Spondylosis without myelopathy or radiculopathy, thoracic region: Secondary | ICD-10-CM | POA: Diagnosis not present

## 2021-05-07 DIAGNOSIS — M5134 Other intervertebral disc degeneration, thoracic region: Secondary | ICD-10-CM | POA: Diagnosis not present

## 2021-06-07 DIAGNOSIS — M47814 Spondylosis without myelopathy or radiculopathy, thoracic region: Secondary | ICD-10-CM | POA: Diagnosis not present

## 2021-06-07 DIAGNOSIS — G894 Chronic pain syndrome: Secondary | ICD-10-CM | POA: Diagnosis not present

## 2021-06-07 DIAGNOSIS — M5134 Other intervertebral disc degeneration, thoracic region: Secondary | ICD-10-CM | POA: Diagnosis not present

## 2021-07-01 DIAGNOSIS — Z8041 Family history of malignant neoplasm of ovary: Secondary | ICD-10-CM | POA: Diagnosis not present

## 2021-07-01 DIAGNOSIS — R102 Pelvic and perineal pain: Secondary | ICD-10-CM | POA: Diagnosis not present

## 2021-07-05 DIAGNOSIS — G894 Chronic pain syndrome: Secondary | ICD-10-CM | POA: Diagnosis not present

## 2021-07-05 DIAGNOSIS — M5134 Other intervertebral disc degeneration, thoracic region: Secondary | ICD-10-CM | POA: Diagnosis not present

## 2021-07-05 DIAGNOSIS — M47814 Spondylosis without myelopathy or radiculopathy, thoracic region: Secondary | ICD-10-CM | POA: Diagnosis not present

## 2021-07-05 DIAGNOSIS — M546 Pain in thoracic spine: Secondary | ICD-10-CM | POA: Diagnosis not present

## 2021-08-02 DIAGNOSIS — M546 Pain in thoracic spine: Secondary | ICD-10-CM | POA: Diagnosis not present

## 2021-08-02 DIAGNOSIS — Z79891 Long term (current) use of opiate analgesic: Secondary | ICD-10-CM | POA: Diagnosis not present

## 2021-08-07 DIAGNOSIS — D649 Anemia, unspecified: Secondary | ICD-10-CM | POA: Diagnosis not present

## 2021-08-29 DIAGNOSIS — M546 Pain in thoracic spine: Secondary | ICD-10-CM | POA: Diagnosis not present

## 2021-09-02 DIAGNOSIS — D509 Iron deficiency anemia, unspecified: Secondary | ICD-10-CM | POA: Diagnosis not present

## 2021-09-02 DIAGNOSIS — R102 Pelvic and perineal pain: Secondary | ICD-10-CM | POA: Diagnosis not present

## 2021-09-02 DIAGNOSIS — Z1272 Encounter for screening for malignant neoplasm of vagina: Secondary | ICD-10-CM | POA: Diagnosis not present

## 2021-09-04 ENCOUNTER — Encounter (HOSPITAL_BASED_OUTPATIENT_CLINIC_OR_DEPARTMENT_OTHER): Payer: Self-pay | Admitting: Obstetrics and Gynecology

## 2021-09-05 ENCOUNTER — Other Ambulatory Visit: Payer: Self-pay

## 2021-09-05 ENCOUNTER — Encounter (HOSPITAL_BASED_OUTPATIENT_CLINIC_OR_DEPARTMENT_OTHER): Payer: Self-pay | Admitting: Obstetrics and Gynecology

## 2021-09-05 NOTE — Progress Notes (Signed)
Spoke w/ via phone for pre-op interview--- pt ?Lab needs dos----  cbc (no t&s, pt refuse blood transfusion)             ?Lab results------ no ?COVID test -----patient states asymptomatic no test needed ?Arrive at ------- 0530 on 09-09-2021 ?NPO after MN NO Solid Food.  Clear liquids from MN until--- 0430 ?Med rec completed ?Medications to take morning of surgery ----- if needed may take hydroxyzine ?Diabetic medication ----- n/a ?Patient instructed no nail polish to be worn day of surgery ?Patient instructed to bring photo id and insurance card day of surgery ?Patient aware to have Driver (ride ) / caregiver for 24 hours after surgery --- husband, Jessica Horn ?Patient Special Instructions ----- n/a ?Pre-Op special Istructions ----- pt is Jehovah witness/ refuses blood products ? ?Called and left message w/ Stanton Kidney, [place] scheduler for Dr Gaetano Net, requested records about pt's Von Willebrand disease to be faxed to place on chart.  Pt stated she gave copy of records to Dr Gaetano Net ? ?Patient verbalized understanding of instructions that were given at this phone interview. ?Patient denies shortness of breath, chest pain, fever, cough at this phone interview.  ?

## 2021-09-08 NOTE — Anesthesia Preprocedure Evaluation (Addendum)
Anesthesia Evaluation  ?Patient identified by MRN, date of birth, ID band ?Patient awake ? ? ? ?Reviewed: ?Allergy & Precautions, NPO status , Patient's Chart, lab work & pertinent test results ? ?Airway ?Mallampati: II ? ?TM Distance: >3 FB ?Neck ROM: Full ? ? ? Dental ?no notable dental hx. ? ?  ?Pulmonary ?former smoker,  ?  ?Pulmonary exam normal ? ? ? ? ? ? ? Cardiovascular ?negative cardio ROS ?Normal cardiovascular exam ? ? ?  ?Neuro/Psych ? Headaches, PSYCHIATRIC DISORDERS Anxiety Depression   ? GI/Hepatic ?negative GI ROS, Neg liver ROS,   ?Endo/Other  ?negative endocrine ROS ? Renal/GU ?negative Renal ROS  ? ?  ?Musculoskeletal ?negative musculoskeletal ROS ?(+)  ? Abdominal ?  ?Peds ? Hematology ? ?(+) REFUSES BLOOD PRODUCTS, JEHOVAH'S WITNESS  ?Anesthesia Other Findings ?PELVIC PAIN ? Reproductive/Obstetrics ? ?  ? ? ? ? ? ? ? ? ? ? ? ? ? ?  ?  ? ? ? ? ? ? ? ?Anesthesia Physical ?Anesthesia Plan ? ?ASA: 2 ? ?Anesthesia Plan: General  ? ?Post-op Pain Management:   ? ?Induction: Intravenous ? ?PONV Risk Score and Plan: 4 or greater and Ondansetron, Dexamethasone, Midazolam, Scopolamine patch - Pre-op and Treatment may vary due to age or medical condition ? ?Airway Management Planned: Oral ETT ? ?Additional Equipment:  ? ?Intra-op Plan:  ? ?Post-operative Plan: Extubation in OR ? ?Informed Consent:  ? ? ? ?Dental advisory given ? ?Plan Discussed with: CRNA ? ?Anesthesia Plan Comments:   ? ? ? ? ? ?Anesthesia Quick Evaluation ? ?

## 2021-09-08 NOTE — H&P (Signed)
Jessica Horn is an 41 y.o. female. She has severe cyclic pelvic pain and a family history of ovarian cancer in 92 yo sister and multiple family members with breast cancer. She states they had negative screens for genetic mutations. She is S/P LS in 2012 for pelvic pain with negative findings and subsequent TAH/LSO for menorrhagia.U/S in office noted 2.9 cm hemorrhagic cyst on remaining right ovary. ?There has been a question of Von Willebrand's Dx in her history. A heme evaluation prior to hysterectomy noted coags within the normal range and she has not been formally diagnosed with Von Willibrand's Dx. Her EBL at L/S without pretreatment was "minimal" and at TAH less than 100 ml.  ?She wants RSO primarily to reduce cancer risk. ? ?Pertinent Gynecological History: ?Menses:  ?Bleeding:  ?Contraception:  ?DES exposure: denies ?Blood transfusions: none ?Sexually transmitted diseases: no past history ?Previous GYN Procedures:  L/S, TAH/LSO   ?Last mammogram: Date:  ?Last pap: normal Date: 2023 ?OB History: G11, P1  ? ?Menstrual History: ?Menarche age ?Patient's last menstrual period was 02/16/2014. ?  ? ?Past Medical History:  ?Diagnosis Date  ? Anemia   ? Chronic pain syndrome   ? Complication of anesthesia   ? per pt hard to wake  ? Family history of adverse reaction to anesthesia   ? sister---  hard to wake  ? History of breast cancer   ? per pt dx inflammatory breast cancer at age 31 without bx / lumpectomy,  completed chemo and one radiation treatment  ? History of COVID-19 2020  ? per pt moderate / severe symptoms, recovered at home,  that resolved  ? MDD (major depressive disorder)   ? Myofascial pain   ? Pelvic pain   ? Refusal of blood transfusions as patient is Jehovah's Witness   ? Von Willebrand disease, unspecified ([place])   ? 09-05-2021  per pt was dx in 2013 , told barely over the line, unknown type,  pt stated never had blood clots;  however, prior to pt's hysterectomy  pt had blood test  done  02-02-2014 in care everywhere, results negative  (pt stated she is records that she gave to Dr Gaetano Net)  ? Wears glasses   ? ? ?Past Surgical History:  ?Procedure Laterality Date  ? CESAREAN SECTION WITH BILATERAL TUBAL LIGATION  10/06/2009  ? LAPAROSCOPIC ASSISTED VAGINAL HYSTERECTOMY Left 04/18/2014  ? '@[facility]'$ ;  w/ Left salpingoophorectomy and node bx  ? OVARIAN CYST REMOVAL Right 2016  ? ? ?Family History  ?Problem Relation Age of Onset  ? Hypertension Mother   ? Anemia Mother   ? Kidney disease Maternal Grandmother   ? Diabetes Maternal Grandmother   ? Breast cancer Maternal Grandmother 9  ? Diabetes Paternal Grandfather   ? Breast cancer Sister 69  ? Breast cancer Paternal Aunt 35  ? Diabetes Maternal Grandfather   ? Cancer Paternal Grandmother   ?     NOS  ? Breast cancer Paternal Aunt 40  ? Breast cancer Paternal Aunt   ? Ovarian cancer Paternal Aunt   ? Cancer Maternal Aunt   ?     breast  ? Lupus Other   ? Multiple sclerosis Other   ? ? ?Social History:  reports that she quit smoking about 25 years ago. Her smoking use included cigarettes. She has never used smokeless tobacco. She reports that she does not currently use alcohol. She reports that she does not currently use drugs after having used the following drugs: Marijuana. ? ?  Allergies: No Known Allergies ? ?No medications prior to admission.  ? ? ?Review of Systems  ?Constitutional:  Negative for fever.  ? ?Height '5\' 5"'$  (1.651 m), weight 63.5 kg, last menstrual period 02/16/2014. ?Physical Exam ?Cardiovascular:  ?   Rate and Rhythm: Normal rate.  ?Pulmonary:  ?   Effort: Pulmonary effort is normal.  ? ? ?No results found for this or any previous visit (from the past 24 hour(s)). ? ?No results found. ? ?Assessment/Plan: ?41 yo  ?Family history of ovarian/breast cancer ?Cyclic pelvic pain ?D/W L/S, possible laparotomy, RSO. D/W risks including infection, organ damage, bleeding-potentially life threatening, DVT/PE, pneumonia. ?She refuses transfusion of  any blood products obtained from another human. She will accept her own RBC via cell saver. She states she would rather die than receive blood products from another human. ?Carefull D/W that RSO will reduce risk of ovarian cancer. It is possible that pelvic pain may persist or recur. D/W some of menopausal symptoms that are possible. Post operative instructions have been reviewed. ? ?Shon Millet II ?09/08/2021, 12:20 PM ? ?

## 2021-09-09 ENCOUNTER — Encounter (HOSPITAL_BASED_OUTPATIENT_CLINIC_OR_DEPARTMENT_OTHER)
Admission: RE | Disposition: A | Discharge status: Home / Self Care | Payer: Self-pay | Source: Home / Self Care | Attending: Obstetrics and Gynecology

## 2021-09-09 ENCOUNTER — Other Ambulatory Visit: Payer: Self-pay

## 2021-09-09 ENCOUNTER — Ambulatory Visit (HOSPITAL_BASED_OUTPATIENT_CLINIC_OR_DEPARTMENT_OTHER): Payer: BC Managed Care – PPO | Admitting: Anesthesiology

## 2021-09-09 ENCOUNTER — Encounter (HOSPITAL_BASED_OUTPATIENT_CLINIC_OR_DEPARTMENT_OTHER): Payer: Self-pay | Admitting: Obstetrics and Gynecology

## 2021-09-09 ENCOUNTER — Ambulatory Visit (HOSPITAL_BASED_OUTPATIENT_CLINIC_OR_DEPARTMENT_OTHER)
Admission: RE | Admit: 2021-09-09 | Discharge: 2021-09-09 | Disposition: A | Discharge status: Home / Self Care | Payer: BC Managed Care – PPO | Attending: Obstetrics and Gynecology | Admitting: Obstetrics and Gynecology

## 2021-09-09 DIAGNOSIS — N8301 Follicular cyst of right ovary: Secondary | ICD-10-CM | POA: Diagnosis not present

## 2021-09-09 DIAGNOSIS — R102 Pelvic and perineal pain: Secondary | ICD-10-CM | POA: Diagnosis not present

## 2021-09-09 DIAGNOSIS — Z8041 Family history of malignant neoplasm of ovary: Secondary | ICD-10-CM | POA: Insufficient documentation

## 2021-09-09 DIAGNOSIS — Z803 Family history of malignant neoplasm of breast: Secondary | ICD-10-CM | POA: Insufficient documentation

## 2021-09-09 DIAGNOSIS — Z853 Personal history of malignant neoplasm of breast: Secondary | ICD-10-CM | POA: Diagnosis not present

## 2021-09-09 DIAGNOSIS — G894 Chronic pain syndrome: Secondary | ICD-10-CM | POA: Diagnosis not present

## 2021-09-09 DIAGNOSIS — F419 Anxiety disorder, unspecified: Secondary | ICD-10-CM | POA: Insufficient documentation

## 2021-09-09 DIAGNOSIS — F32A Depression, unspecified: Secondary | ICD-10-CM | POA: Insufficient documentation

## 2021-09-09 DIAGNOSIS — Z8616 Personal history of COVID-19: Secondary | ICD-10-CM | POA: Insufficient documentation

## 2021-09-09 DIAGNOSIS — D270 Benign neoplasm of right ovary: Secondary | ICD-10-CM | POA: Insufficient documentation

## 2021-09-09 DIAGNOSIS — N8311 Corpus luteum cyst of right ovary: Secondary | ICD-10-CM | POA: Diagnosis not present

## 2021-09-09 DIAGNOSIS — Z809 Family history of malignant neoplasm, unspecified: Secondary | ICD-10-CM

## 2021-09-09 DIAGNOSIS — Z87891 Personal history of nicotine dependence: Secondary | ICD-10-CM | POA: Diagnosis not present

## 2021-09-09 DIAGNOSIS — K660 Peritoneal adhesions (postprocedural) (postinfection): Secondary | ICD-10-CM | POA: Insufficient documentation

## 2021-09-09 DIAGNOSIS — Z1502 Genetic susceptibility to malignant neoplasm of ovary: Secondary | ICD-10-CM | POA: Diagnosis not present

## 2021-09-09 HISTORY — DX: Myalgia, other site: M79.18

## 2021-09-09 HISTORY — PX: LAPAROSCOPIC SALPINGO OOPHERECTOMY: SHX5927

## 2021-09-09 HISTORY — DX: Von Willebrand disease, unspecified: D68.00

## 2021-09-09 HISTORY — DX: Reserved for inherently not codable concepts without codable children: IMO0001

## 2021-09-09 HISTORY — PX: LAPAROSCOPIC LYSIS OF ADHESIONS: SHX5905

## 2021-09-09 HISTORY — DX: Personal history of malignant neoplasm of breast: Z85.3

## 2021-09-09 HISTORY — DX: Chronic pain syndrome: G89.4

## 2021-09-09 HISTORY — DX: Other complications of anesthesia, initial encounter: T88.59XA

## 2021-09-09 HISTORY — DX: Presence of spectacles and contact lenses: Z97.3

## 2021-09-09 HISTORY — DX: Pelvic and perineal pain: R10.2

## 2021-09-09 HISTORY — DX: Anemia, unspecified: D64.9

## 2021-09-09 HISTORY — DX: Family history of other specified conditions: Z84.89

## 2021-09-09 HISTORY — DX: Major depressive disorder, single episode, unspecified: F32.9

## 2021-09-09 HISTORY — DX: Procedure and treatment not carried out because of patient's decision for reasons of belief and group pressure: Z53.1

## 2021-09-09 LAB — CBC
HCT: 31.9 % — ABNORMAL LOW (ref 36.0–46.0)
Hemoglobin: 10.3 g/dL — ABNORMAL LOW (ref 12.0–15.0)
MCH: 29.4 pg (ref 26.0–34.0)
MCHC: 32.3 g/dL (ref 30.0–36.0)
MCV: 91.1 fL (ref 80.0–100.0)
Platelets: 315 10*3/uL (ref 150–400)
RBC: 3.5 MIL/uL — ABNORMAL LOW (ref 3.87–5.11)
RDW: 13.5 % (ref 11.5–15.5)
WBC: 6.1 10*3/uL (ref 4.0–10.5)
nRBC: 0 % (ref 0.0–0.2)

## 2021-09-09 SURGERY — SALPINGO-OOPHORECTOMY, LAPAROSCOPIC
Anesthesia: General | Site: Abdomen | Laterality: Right

## 2021-09-09 MED ORDER — LIDOCAINE HCL (PF) 2 % IJ SOLN
INTRAMUSCULAR | Status: AC
  Filled 2021-09-09: qty 5

## 2021-09-09 MED ORDER — OXYCODONE HCL 5 MG PO TABS: 5.0000 mg | ORAL_TABLET | Freq: Once | ORAL | Status: DC | PRN

## 2021-09-09 MED ORDER — BUPIVACAINE HCL (PF) 0.5 % IJ SOLN
INTRAMUSCULAR | Status: DC | PRN
Start: 2021-09-09 — End: 2021-09-09
  Administered 2021-09-09 (×2): 10 mL

## 2021-09-09 MED ORDER — MIDAZOLAM HCL 2 MG/2ML IJ SOLN
INTRAMUSCULAR | Status: AC
  Filled 2021-09-09: qty 2

## 2021-09-09 MED ORDER — DEXAMETHASONE SODIUM PHOSPHATE 10 MG/ML IJ SOLN
INTRAMUSCULAR | Status: AC
  Filled 2021-09-09: qty 1

## 2021-09-09 MED ORDER — PROPOFOL 10 MG/ML IV BOLUS
INTRAVENOUS | Status: AC
  Filled 2021-09-09: qty 20

## 2021-09-09 MED ORDER — ACETAMINOPHEN 500 MG PO TABS
ORAL_TABLET | ORAL | Status: AC
  Filled 2021-09-09: qty 2

## 2021-09-09 MED ORDER — CEFAZOLIN SODIUM-DEXTROSE 2-4 GM/100ML-% IV SOLN
INTRAVENOUS | Status: AC
  Filled 2021-09-09: qty 100

## 2021-09-09 MED ORDER — SCOPOLAMINE 1 MG/3DAYS TD PT72
1.0000 | MEDICATED_PATCH | TRANSDERMAL | Status: DC
  Administered 2021-09-09: 1.5 mg via TRANSDERMAL

## 2021-09-09 MED ORDER — OXYCODONE HCL 5 MG/5ML PO SOLN: 5.0000 mg | Freq: Once | ORAL | Status: DC | PRN

## 2021-09-09 MED ORDER — SUGAMMADEX SODIUM 200 MG/2ML IV SOLN
INTRAVENOUS | Status: DC | PRN
  Administered 2021-09-09: 200 mg via INTRAVENOUS

## 2021-09-09 MED ORDER — CEFAZOLIN SODIUM-DEXTROSE 2-4 GM/100ML-% IV SOLN
2.0000 g | INTRAVENOUS | Status: AC
  Administered 2021-09-09: 2 g via INTRAVENOUS

## 2021-09-09 MED ORDER — ROCURONIUM BROMIDE 100 MG/10ML IV SOLN
INTRAVENOUS | Status: DC | PRN
  Administered 2021-09-09: 50 mg via INTRAVENOUS

## 2021-09-09 MED ORDER — ACETAMINOPHEN 500 MG PO TABS
1000.0000 mg | ORAL_TABLET | Freq: Once | ORAL | Status: AC
  Administered 2021-09-09: 1000 mg via ORAL

## 2021-09-09 MED ORDER — ONDANSETRON HCL 4 MG/2ML IJ SOLN
INTRAMUSCULAR | Status: DC | PRN
  Administered 2021-09-09: 4 mg via INTRAVENOUS

## 2021-09-09 MED ORDER — LIDOCAINE HCL (CARDIAC) PF 100 MG/5ML IV SOSY
PREFILLED_SYRINGE | INTRAVENOUS | Status: DC | PRN
  Administered 2021-09-09: 60 mg via INTRATRACHEAL

## 2021-09-09 MED ORDER — PHENYLEPHRINE 80 MCG/ML (10ML) SYRINGE FOR IV PUSH (FOR BLOOD PRESSURE SUPPORT)
PREFILLED_SYRINGE | INTRAVENOUS | Status: DC | PRN
  Administered 2021-09-09 (×4): 80 ug via INTRAVENOUS

## 2021-09-09 MED ORDER — SOD CITRATE-CITRIC ACID 500-334 MG/5ML PO SOLN: 30.0000 mL | ORAL | Status: DC

## 2021-09-09 MED ORDER — ROCURONIUM BROMIDE 10 MG/ML (PF) SYRINGE
PREFILLED_SYRINGE | INTRAVENOUS | Status: AC
  Filled 2021-09-09: qty 10

## 2021-09-09 MED ORDER — LACTATED RINGERS IV SOLN: INTRAVENOUS | Status: DC

## 2021-09-09 MED ORDER — PROPOFOL 10 MG/ML IV BOLUS
INTRAVENOUS | Status: DC | PRN
  Administered 2021-09-09: 200 mg via INTRAVENOUS
  Administered 2021-09-09: 20 mg via INTRAVENOUS

## 2021-09-09 MED ORDER — DEXMEDETOMIDINE (PRECEDEX) IN NS 20 MCG/5ML (4 MCG/ML) IV SYRINGE
PREFILLED_SYRINGE | INTRAVENOUS | Status: AC
  Filled 2021-09-09: qty 5

## 2021-09-09 MED ORDER — FENTANYL CITRATE (PF) 250 MCG/5ML IJ SOLN
INTRAMUSCULAR | Status: DC | PRN
Start: 2021-09-09 — End: 2021-09-09
  Administered 2021-09-09: 50 ug via INTRAVENOUS
  Administered 2021-09-09: 150 ug via INTRAVENOUS
  Administered 2021-09-09: 50 ug via INTRAVENOUS

## 2021-09-09 MED ORDER — PHENYLEPHRINE 80 MCG/ML (10ML) SYRINGE FOR IV PUSH (FOR BLOOD PRESSURE SUPPORT)
PREFILLED_SYRINGE | INTRAVENOUS | Status: AC
  Filled 2021-09-09: qty 10

## 2021-09-09 MED ORDER — ONDANSETRON HCL 4 MG/2ML IJ SOLN
INTRAMUSCULAR | Status: AC
  Filled 2021-09-09: qty 2

## 2021-09-09 MED ORDER — DEXAMETHASONE SODIUM PHOSPHATE 10 MG/ML IJ SOLN
INTRAMUSCULAR | Status: DC | PRN
  Administered 2021-09-09: 10 mg via INTRAVENOUS

## 2021-09-09 MED ORDER — DEXMEDETOMIDINE (PRECEDEX) IN NS 20 MCG/5ML (4 MCG/ML) IV SYRINGE
PREFILLED_SYRINGE | INTRAVENOUS | Status: DC | PRN
  Administered 2021-09-09 (×3): 4 ug via INTRAVENOUS

## 2021-09-09 MED ORDER — SCOPOLAMINE 1 MG/3DAYS TD PT72
MEDICATED_PATCH | TRANSDERMAL | Status: AC
  Filled 2021-09-09: qty 1

## 2021-09-09 MED ORDER — POVIDONE-IODINE 10 % EX SWAB: 2.0000 "application " | Freq: Once | CUTANEOUS | Status: DC

## 2021-09-09 MED ORDER — 0.9 % SODIUM CHLORIDE (POUR BTL) OPTIME
TOPICAL | Status: DC | PRN
  Administered 2021-09-09: 500 mL

## 2021-09-09 MED ORDER — AMISULPRIDE (ANTIEMETIC) 5 MG/2ML IV SOLN: 10.0000 mg | Freq: Once | INTRAVENOUS | Status: DC | PRN

## 2021-09-09 MED ORDER — KETOROLAC TROMETHAMINE 30 MG/ML IJ SOLN
INTRAMUSCULAR | Status: AC
  Filled 2021-09-09: qty 1

## 2021-09-09 MED ORDER — KETOROLAC TROMETHAMINE 30 MG/ML IJ SOLN: 30.0000 mg | Freq: Once | INTRAMUSCULAR | Status: DC | PRN

## 2021-09-09 MED ORDER — FENTANYL CITRATE (PF) 100 MCG/2ML IJ SOLN: 25.0000 ug | INTRAMUSCULAR | Status: DC | PRN

## 2021-09-09 MED ORDER — HEMOSTATIC AGENTS (NO CHARGE) OPTIME
TOPICAL | Status: DC | PRN
  Administered 2021-09-09: 1

## 2021-09-09 MED ORDER — FENTANYL CITRATE (PF) 250 MCG/5ML IJ SOLN
INTRAMUSCULAR | Status: AC
  Filled 2021-09-09: qty 5

## 2021-09-09 SURGICAL SUPPLY — 47 items
ADH SKN CLS APL DERMABOND .7 (GAUZE/BANDAGES/DRESSINGS) ×3
APL PRP STRL LF DISP 70% ISPRP (MISCELLANEOUS) ×3
APL SRG 38 LTWT LNG FL B (MISCELLANEOUS) ×3
APPLICATOR ARISTA FLEXITIP XL (MISCELLANEOUS) ×2 IMPLANT
CHLORAPREP W/TINT 26 (MISCELLANEOUS) ×4 IMPLANT
COVER MAYO STAND STRL (DRAPES) ×4 IMPLANT
DECANTER SPIKE VIAL GLASS SM (MISCELLANEOUS) ×2 IMPLANT
DERMABOND ADVANCED (GAUZE/BANDAGES/DRESSINGS) ×1
DERMABOND ADVANCED .7 DNX12 (GAUZE/BANDAGES/DRESSINGS) ×3 IMPLANT
DRAPE WARM FLUID 44X44 (DRAPES) ×4 IMPLANT
DRSG OPSITE POSTOP 3X4 (GAUZE/BANDAGES/DRESSINGS) ×2 IMPLANT
DRSG OPSITE POSTOP 4X10 (GAUZE/BANDAGES/DRESSINGS) ×4 IMPLANT
DURAPREP 26ML APPLICATOR (WOUND CARE) ×4 IMPLANT
ELECT REM PT RETURN 9FT ADLT (ELECTROSURGICAL) ×4
ELECTRODE REM PT RTRN 9FT ADLT (ELECTROSURGICAL) ×1 IMPLANT
GAUZE 4X4 16PLY ~~LOC~~+RFID DBL (SPONGE) ×4 IMPLANT
GAUZE SPONGE 4X4 12PLY STRL LF (GAUZE/BANDAGES/DRESSINGS) ×2 IMPLANT
GLOVE BIO SURGEON STRL SZ8 (GLOVE) ×8 IMPLANT
GLOVE SURG ENC MOIS LTX SZ6.5 (GLOVE) ×4 IMPLANT
GLOVE SURG POLYISO LF SZ7 (GLOVE) ×8 IMPLANT
GOWN STRL REUS W/ TWL LRG LVL3 (GOWN DISPOSABLE) ×2 IMPLANT
GOWN STRL REUS W/TWL LRG LVL3 (GOWN DISPOSABLE) ×8
GOWN STRL REUS W/TWL XL LVL3 (GOWN DISPOSABLE) ×4 IMPLANT
HEMOSTAT ARISTA ABSORB 3G PWDR (HEMOSTASIS) ×2 IMPLANT
KIT TURNOVER CYSTO (KITS) ×4 IMPLANT
NEEDLE INSUFFLATION 120MM (ENDOMECHANICALS) ×4 IMPLANT
NS IRRIG 500ML POUR BTL (IV SOLUTION) ×4 IMPLANT
PACK LAPAROSCOPY BASIN (CUSTOM PROCEDURE TRAY) ×4 IMPLANT
PACK TRENDGUARD 450 HYBRID PRO (MISCELLANEOUS) ×3 IMPLANT
PAD OB MATERNITY 4.3X12.25 (PERSONAL CARE ITEMS) ×4 IMPLANT
PAD PREP 24X48 CUFFED NSTRL (MISCELLANEOUS) ×4 IMPLANT
SEALER TISSUE G2 CVD JAW 45CM (ENDOMECHANICALS) ×4 IMPLANT
SET SUCTION IRRIG HYDROSURG (IRRIGATION / IRRIGATOR) IMPLANT
SET TUBE SMOKE EVAC HIGH FLOW (TUBING) ×4 IMPLANT
SUT VIC AB 4-0 PS2 27 (SUTURE) ×4 IMPLANT
SUT VICRYL 0 UR6 27IN ABS (SUTURE) ×4 IMPLANT
SYR 30ML LL (SYRINGE) ×4 IMPLANT
SYR BULB IRRIG 60ML STRL (SYRINGE) ×4 IMPLANT
SYS RETRIEVAL 5MM INZII UNIV (BASKET) ×4
SYSTEM RETRIEVL 5MM INZII UNIV (BASKET) ×1 IMPLANT
TOWEL OR 17X26 10 PK STRL BLUE (TOWEL DISPOSABLE) ×4 IMPLANT
TRAY FOLEY W/BAG SLVR 14FR LF (SET/KITS/TRAYS/PACK) ×2 IMPLANT
TRENDGUARD 450 HYBRID PRO PACK (MISCELLANEOUS) ×4
TROCAR BALLN 12MMX100 BLUNT (TROCAR) IMPLANT
TROCAR BLADELESS OPT 5 100 (ENDOMECHANICALS) ×4 IMPLANT
TROCAR XCEL NON-BLD 11X100MML (ENDOMECHANICALS) ×4 IMPLANT
WARMER LAPAROSCOPE (MISCELLANEOUS) ×4 IMPLANT

## 2021-09-09 NOTE — Progress Notes (Signed)
No changes to H&P per patient history ?Reviewed procedure-laparoscopy, possible laparotomy, with right salpingo oophorectomy ?She states she does not want any blood products transfused. She does state that she would accept her own red blood cells processed through a cell saver. She initials and I cosign and date this. ?Post op pain medications discussed-tylenol, Tramadol prn. Will try to hold ibuprofen for the first 24 hours. ?All questions answered and she states she understands and agrees. ?

## 2021-09-09 NOTE — Op Note (Signed)
NAME: Jessica Horn, Jessica Horn ?MEDICAL RECORD NO: [id number] ?ACCOUNT NO: [id number] ?DATE OF BIRTH: 02-01-1981 ?FACILITY: [facility] ?LOCATION: [facility] ?PHYSICIAN: Daleen Bo. Lyn Hollingshead, MD ? ?Operative Report  ? ?DATE OF PROCEDURE: 09/09/2021 ? ?PREOPERATIVE DIAGNOSES:   ?1.  Family history of ovarian cancer. ?2.  Pelvic pain. ? ?POSTOPERATIVE DIAGNOSES:   ?1.  Family history of ovarian cancer. ?2.  Pelvic pain. ?3.  Abdominal adhesions. ? ?PROCEDURE:  Laparoscopy with right salpingo-oophorectomy and extensive lysis of adhesions. ? ?ASSISTANT:  Daleen Bo. Lyn Hollingshead, MD ? ?ANESTHESIA:  General with endotracheal intubation. ? ?ESTIMATED BLOOD LOSS:  30 mL ? ?SPECIMENS:  Right tube and ovary to pathology. ? ?INDICATIONS AND CONSENT:  This patient is a 41 year old patient status post TAH LSO.  She has cyclic sometimes severe pelvic pain.  She also has a history of metastatic ovarian cancer in her 39 year old sister.  She desires removal of the right tube and  ?ovary for cancer reduction.  We have discussed the fact that this may or may not relieve her pelvic pain and she states she understands this.  Ultrasound in the office reveals a 2.9 cm probable hemorrhagic right ovarian cyst.  Potential risks and  ?complications have been discussed preoperatively including but not limited to infection, organ damage, bleeding, possibly life-threatening bleeding, DVT, PE, pneumonia.  Possible necessity of laparotomy was discussed.  The patient states that she refuses ? transfusion of any blood product obtained from another human being.  She states she adamantly desires no transfusion of blood products and would rather die than receive this.  She has signed a consent, stating this.  She states that she would accept her ? own red blood cells processed through a Cell Saver device.  She also signed and dates this. ? ?FINDINGS:  Upper abdomen is grossly normal.  There were a lot of omental adhesions to the anterior abdominal wall down the midline  that do not contain bowel.  The pelvis is without lesion or nodularity.  Peritoneal surfaces are smooth.  The right ovary  ?contains approximately 1.5 to 2 cm involuting cyst and a few filmy adhesions. The visible diaphragm is without lesion as well. ? ?DESCRIPTION OF PROCEDURE:  The patient was taken to the operating room where she is identified, placed in the dorsal supine position and general anesthesia was induced via endotracheal intubation.  She was placed in the dorsal lithotomy position.  She  ?was prepped vaginally with Betadine.  She has voided immediately prior to transfer to the operating room.  Abdominally she was prepped with DuraPrep.  Timeout was done.  After 3-minute drying time, she was draped in sterile fashion.  The infraumbilical  ?and suprapubic areas were injected with approximately 10 mL total of 0.5% plain Marcaine.  Small infraumbilical incision was made and a disposable Veress needle was placed on the first attempt without difficulty.  A good syringe and drop test are noted.  ? Two liters of gas were insufflated under low pressure with good tympany in the right upper quadrant.  Veress needle was removed and a 10/11 Xcel bladeless disposable trocar sleeve was placed using direct visualization.  Then, switching to the operative  ?scope the omental adhesions were taken down at their point of insertion of the anterior abdominal wall.  Excellent hemostasis was maintained.  This allows visualization of the pelvis.  A couple of filmy adhesions around the right tube and ovary are  ?easily taken down.  This mobilizes the ovary well away from the sidewall.  It  is well clear of the course of the right ureter.  Then, using the EnSeal bipolar cutter cutting instrument that had also been used on the omentum.  The infundibulopelvic  ?ligament was taken down followed by the remainder of the attachments, freeing the ovary.  Excellent hemostasis was maintained.  The tube and ovary were placed in the pelvis.   Then, switching to the 5 mm 0-degree laparoscope through the suprapubic  ?incision the EndoCatch bag was placed through the umbilical incision scooping the tube and ovary and they are retrieved into the incision.  While watching from below with a 5 mm scope to assure no damage to the surrounding structures the fascia of the  ?incision is both stretched with Kelly clamp and incised to a small degree with Mayo scissors, which allows easy retrieval of the tube and ovary.  Trocar sleeve was replaced.  Pneumoperitoneum is resumed and inspection all around reveals excellent  ?hemostasis.  The pneumoperitoneum was reduced yet again and again careful inspection reveals excellent hemostasis at all points of surgery.  The remaining approximately 10 mL of 0.5% plain Marcaine is placed in the peritoneum.  Arista was then placed  ?over the point of right salpingo-oophorectomy on the edges of the omentum that had been taken down as well as on the anterior abdominal wall at the points of dissection as well.  Trocar sleeves were removed.  Pneumoperitoneum was reduced.  The umbilical  ?incision was closed with a 0 Vicryl pursestring.  The fascia being very careful not to pick up any underlying structures.  Inspection reveals excellent closure of the fascia.  Both incisions were closed with interrupted 2-0 Vicryl.  Dermabond was placed  ?on both and an Op-Site and pressure dressing were placed on the umbilical incision.  All counts were correct.  The patient was awakened and taken to recovery room in stable condition. ? ? ?PUS ?D: 09/09/2021 9:01:29 am T: 09/09/2021 1:43:00 pm  ?JOB: [id number]/ [id number]  ?

## 2021-09-09 NOTE — Progress Notes (Signed)
09/09/2021 ? ?8:51 AM ? ?PATIENT:  Jessica Horn  41 y.o. female ? ?PRE-OPERATIVE DIAGNOSIS:  PELVIC PAIN, family history of ovarian cancer ? ?POST-OPERATIVE DIAGNOSIS:  PELVIC PAIN, family history of ovarian cancer, ?Abdominal adhesions ? ?PROCEDURE:  Procedure(s): ?LAPAROSCOPIC UNILATERAL SALPINGO OOPHORECTOMY (Right) ?LAPAROSCOPIC EXTENSIVE LYSIS OF ADHESIONS (N/A) ? ?SURGEON:  Surgeon(s) and Role: ?   Everlene Farrier, MD - Primary ? ?PHYSICIAN ASSISTANT:  ? ?ASSISTANTS: none  ? ?ANESTHESIA:   general ? ?EBL:  30 mL  ? ?BLOOD ADMINISTERED:none ? ?DRAINS: none  ? ?LOCAL MEDICATIONS USED:  MARCAINE    and Amount: 20 ml ? ?SPECIMEN:  Source of Specimen:  right tube and ovary ? ?DISPOSITION OF SPECIMEN:  PATHOLOGY ? ?COUNTS:  YES ? ?TOURNIQUET:  * No tourniquets in log * ? ?DICTATION: .Other Dictation: Dictation Number [id number] ? ?PLAN OF CARE: Discharge to home after PACU ? ?PATIENT DISPOSITION:  PACU - hemodynamically stable. ?  ?Delay start of Pharmacological VTE agent (>24hrs) due to surgical blood loss or risk of bleeding: not applicable ? ?

## 2021-09-09 NOTE — Anesthesia Postprocedure Evaluation (Signed)
Anesthesia Post Note ? ?Patient: Jessica Horn ? ?Procedure(s) Performed: LAPAROSCOPIC UNILATERAL SALPINGO OOPHORECTOMY (Right: Abdomen) ?LAPAROSCOPIC EXTENSIVE LYSIS OF ADHESIONS (Abdomen) ? ?  ? ?Patient location during evaluation: PACU ?Anesthesia Type: General ?Level of consciousness: awake ?Pain management: pain level controlled ?Vital Signs Assessment: post-procedure vital signs reviewed and stable ?Respiratory status: spontaneous breathing, nonlabored ventilation, respiratory function stable and patient connected to nasal cannula oxygen ?Cardiovascular status: blood pressure returned to baseline and stable ?Postop Assessment: no apparent nausea or vomiting ?Anesthetic complications: no ? ? ?No notable events documented. ? ?Last Vitals:  ?Vitals:  ? 09/09/21 0930 09/09/21 1000  ?BP:  128/68  ?Pulse:  80  ?Resp:  16  ?Temp: 36.9 ?C 37 ?C  ?SpO2:  96%  ?  ?Last Pain:  ?Vitals:  ? 09/09/21 1000  ?TempSrc:   ?PainSc: 0-No pain  ? ? ?  ?  ?  ?  ?  ?  ? ?Zedric Deroy P Yvett Rossel ? ? ? ? ?

## 2021-09-09 NOTE — Transfer of Care (Signed)
Immediate Anesthesia Transfer of Care Note ? ?Patient: Jessica Horn ? ?Procedure(s) Performed: Procedure(s) (LRB): ?LAPAROSCOPIC UNILATERAL SALPINGO OOPHORECTOMY (Right) ?LAPAROSCOPIC EXTENSIVE LYSIS OF ADHESIONS (N/A) ? ?Patient Location: PACU ? ?Anesthesia Type: General ? ?Level of Consciousness: awake, alert  and oriented ? ?Airway & Oxygen Therapy: Patient Spontanous Breathing  ? ?Post-op Assessment: Report given to PACU RN and Post -op Vital signs reviewed and stable ? ?Post vital signs: Reviewed and stable ? ?Complications: No apparent anesthesia complications ? ?Last Vitals:  ?Vitals Value Taken Time  ?BP 118/65 09/09/21 0900  ?Temp 36.4 ?C 09/09/21 0856  ?Pulse 79 09/09/21 0904  ?Resp 14 09/09/21 0904  ?SpO2 100 % 09/09/21 0904  ?Vitals shown include unvalidated device data. ? ?Last Pain:  ?Vitals:  ? 09/09/21 0900  ?TempSrc:   ?PainSc: 0-No pain  ?   ? ?Patients Stated Pain Goal: 6 (09/09/21 4142) ? ?Complications: No notable events documented. ?

## 2021-09-09 NOTE — Anesthesia Procedure Notes (Signed)
Procedure Name: Intubation ?Date/Time: 09/09/2021 7:31 AM ?Performed by: Mechele Claude, CRNA ?Pre-anesthesia Checklist: Patient identified, Emergency Drugs available, Suction available and Patient being monitored ?Patient Re-evaluated:Patient Re-evaluated prior to induction ?Oxygen Delivery Method: Circle system utilized ?Preoxygenation: Pre-oxygenation with 100% oxygen ?Induction Type: IV induction ?Ventilation: Mask ventilation without difficulty ?Laryngoscope Size: Mac and 3 ?Tube type: Oral ?Tube size: 7.0 mm ?Number of attempts: 1 ?Airway Equipment and Method: Stylet and Oral airway ?Placement Confirmation: ETT inserted through vocal cords under direct vision, positive ETCO2 and breath sounds checked- equal and bilateral ?Secured at: 21 cm ?Tube secured with: Tape ?Dental Injury: Teeth and Oropharynx as per pre-operative assessment  ? ? ? ? ?

## 2021-09-09 NOTE — Discharge Instructions (Signed)
DISCHARGE INSTRUCTIONS: Laparoscopy ? ?The following instructions have been prepared to help you care for yourself upon your return home today. ? ?Wound care: ? Do not get the incision wet for the first 24 hours. The incision should be kept clean and dry. ? The Band-Aids or dressings may be removed the day after surgery. ? Should the incision become sore, red, and swollen after the first week, check with your doctor. ? ?Personal hygiene: ? Shower the day after your procedure. ? ?Activity and limitations: ? Do NOT drive or operate any equipment today. ? Do NOT lift anything more than 15 pounds for 2-3 weeks after surgery. ? Do NOT rest in bed all day. ? Walking is encouraged. Walk each day, starting slowly with 5-minute walks 3 or 4 times a day. Slowly increase the length of your walks. ? Walk up and down stairs slowly. ? Do NOT do strenuous activities, such as golfing, playing tennis, bowling, running, biking, weight lifting, gardening, mowing, or vacuuming for 2-4 weeks. Ask your doctor when it is okay to start. ? ?Diet: Eat a light meal as desired this evening. You may resume your usual diet tomorrow. ? ?Return to work: This is dependent on the type of work you do. For the most part you can return to a desk job within a week of surgery. If you are more active at work, please discuss this with your doctor. ? ?What to expect after your surgery: You may have a slight burning sensation when you urinate on the first day. You may have a very small amount of blood in the urine. Expect to have a small amount of vaginal discharge/light bleeding for 1-2 weeks. It is not unusual to have abdominal soreness and bruising for up to 2 weeks. You may be tired and need more rest for about 1 week. You may experience shoulder pain for 24-72 hours. Lying flat in bed may relieve it. ? ?Call your doctor for any of the following: ? Develop a fever of 100.4 or greater ? Inability to urinate 6 hours after discharge from hospital ? Severe  pain not relieved by pain medications ? Persistent of heavy bleeding at incision site ? Redness or swelling around incision site after a week ? Increasing nausea or vomiting ? ? ?Post Anesthesia Home Care Instructions ? ?Activity: ?Get plenty of rest for the remainder of the day. A responsible individual must stay with you for 24 hours following the procedure.  ?For the next 24 hours, DO NOT: ?-Drive a car ?-Paediatric nurse ?-Drink alcoholic beverages ?-Take any medication unless instructed by your physician ?-Make any legal decisions or sign important papers. ? ?Meals: ?Start with liquid foods such as gelatin or soup. Progress to regular foods as tolerated. Avoid greasy, spicy, heavy foods. If nausea and/or vomiting occur, drink only clear liquids until the nausea and/or vomiting subsides. Call your physician if vomiting continues. ? ?Special Instructions/Symptoms: ?Your throat may feel dry or sore from the anesthesia or the breathing tube placed in your throat during surgery. If this causes discomfort, gargle with warm salt water. The discomfort should disappear within 24 hours. ? ?If you had a scopolamine patch placed behind your ear for the management of post- operative nausea and/or vomiting: ? ?1. The medication in the patch is effective for 72 hours, after which it should be removed.  Wrap patch in a tissue and discard in the trash. Wash hands thoroughly with soap and water. ?2. You may remove the patch earlier than 72  hours if you experience unpleasant side effects which may include dry mouth, dizziness or visual disturbances. ?3. Avoid touching the patch. Wash your hands with soap and water after contact with the patch. ?4. Please remove by Thursday 09/12/2021 ? ?Do not take any tylenol until 12:30p or after today if needed. ?

## 2021-09-09 NOTE — Discharge Summary (Signed)
Discharge Summary ? ?Admitting diagnosis-pelvic pain, family history of ovarian cancer ?Discharge diagnosis-pelvic pain, family history of ovarian cancer, abdominal adhesions ?Procedure - laparoscopic right salpingo oophorectomy, extensive lysis of adhesions ?Follow up in office 1-2 weeks ?Post operative medication-tylenol, tramadol prn, resume preoperative medications ?

## 2021-09-10 LAB — SURGICAL PATHOLOGY

## 2021-09-11 ENCOUNTER — Encounter (HOSPITAL_BASED_OUTPATIENT_CLINIC_OR_DEPARTMENT_OTHER): Payer: Self-pay | Admitting: Obstetrics and Gynecology

## 2021-09-23 DIAGNOSIS — Z09 Encounter for follow-up examination after completed treatment for conditions other than malignant neoplasm: Secondary | ICD-10-CM | POA: Diagnosis not present

## 2021-09-26 DIAGNOSIS — M546 Pain in thoracic spine: Secondary | ICD-10-CM | POA: Diagnosis not present

## 2021-10-23 DIAGNOSIS — Z79891 Long term (current) use of opiate analgesic: Secondary | ICD-10-CM | POA: Diagnosis not present

## 2021-10-23 DIAGNOSIS — M546 Pain in thoracic spine: Secondary | ICD-10-CM | POA: Diagnosis not present

## 2021-11-06 DIAGNOSIS — M5414 Radiculopathy, thoracic region: Secondary | ICD-10-CM | POA: Diagnosis not present

## 2021-11-15 DIAGNOSIS — M546 Pain in thoracic spine: Secondary | ICD-10-CM | POA: Diagnosis not present

## 2021-11-25 DIAGNOSIS — M546 Pain in thoracic spine: Secondary | ICD-10-CM | POA: Diagnosis not present

## 2021-12-23 DIAGNOSIS — M546 Pain in thoracic spine: Secondary | ICD-10-CM | POA: Diagnosis not present

## 2022-01-20 ENCOUNTER — Emergency Department (HOSPITAL_COMMUNITY): Payer: BC Managed Care – PPO | Admitting: Certified Registered Nurse Anesthetist

## 2022-01-20 ENCOUNTER — Encounter (HOSPITAL_COMMUNITY): Admission: EM | Disposition: A | Discharge status: Home / Self Care | Payer: Self-pay | Source: Home / Self Care

## 2022-01-20 ENCOUNTER — Inpatient Hospital Stay (HOSPITAL_COMMUNITY)
Admission: EM | Admit: 2022-01-20 | Discharge: 2022-01-24 | DRG: 326 | Disposition: A | Discharge status: Home / Self Care | Payer: BC Managed Care – PPO | Attending: General Surgery | Admitting: General Surgery

## 2022-01-20 ENCOUNTER — Encounter (HOSPITAL_COMMUNITY): Payer: Self-pay | Admitting: Emergency Medicine

## 2022-01-20 ENCOUNTER — Emergency Department (HOSPITAL_COMMUNITY): Payer: BC Managed Care – PPO

## 2022-01-20 ENCOUNTER — Other Ambulatory Visit: Payer: Self-pay

## 2022-01-20 DIAGNOSIS — E876 Hypokalemia: Secondary | ICD-10-CM | POA: Diagnosis not present

## 2022-01-20 DIAGNOSIS — G894 Chronic pain syndrome: Secondary | ICD-10-CM | POA: Diagnosis not present

## 2022-01-20 DIAGNOSIS — K259 Gastric ulcer, unspecified as acute or chronic, without hemorrhage or perforation: Secondary | ICD-10-CM | POA: Diagnosis not present

## 2022-01-20 DIAGNOSIS — D500 Iron deficiency anemia secondary to blood loss (chronic): Secondary | ICD-10-CM | POA: Diagnosis not present

## 2022-01-20 DIAGNOSIS — Z853 Personal history of malignant neoplasm of breast: Secondary | ICD-10-CM

## 2022-01-20 DIAGNOSIS — J90 Pleural effusion, not elsewhere classified: Secondary | ICD-10-CM | POA: Diagnosis not present

## 2022-01-20 DIAGNOSIS — D6800 Von Willebrand disease, unspecified: Secondary | ICD-10-CM | POA: Diagnosis not present

## 2022-01-20 DIAGNOSIS — F418 Other specified anxiety disorders: Secondary | ICD-10-CM | POA: Diagnosis not present

## 2022-01-20 DIAGNOSIS — R1011 Right upper quadrant pain: Secondary | ICD-10-CM | POA: Diagnosis not present

## 2022-01-20 DIAGNOSIS — K255 Chronic or unspecified gastric ulcer with perforation: Principal | ICD-10-CM | POA: Diagnosis present

## 2022-01-20 DIAGNOSIS — K658 Other peritonitis: Secondary | ICD-10-CM | POA: Diagnosis not present

## 2022-01-20 DIAGNOSIS — Z8616 Personal history of COVID-19: Secondary | ICD-10-CM | POA: Diagnosis not present

## 2022-01-20 DIAGNOSIS — Z803 Family history of malignant neoplasm of breast: Secondary | ICD-10-CM

## 2022-01-20 DIAGNOSIS — K251 Acute gastric ulcer with perforation: Principal | ICD-10-CM

## 2022-01-20 DIAGNOSIS — R9431 Abnormal electrocardiogram [ECG] [EKG]: Secondary | ICD-10-CM | POA: Diagnosis not present

## 2022-01-20 DIAGNOSIS — R079 Chest pain, unspecified: Secondary | ICD-10-CM | POA: Diagnosis not present

## 2022-01-20 DIAGNOSIS — Z87891 Personal history of nicotine dependence: Secondary | ICD-10-CM

## 2022-01-20 DIAGNOSIS — R109 Unspecified abdominal pain: Secondary | ICD-10-CM | POA: Diagnosis not present

## 2022-01-20 DIAGNOSIS — D649 Anemia, unspecified: Secondary | ICD-10-CM | POA: Diagnosis not present

## 2022-01-20 HISTORY — PX: LAPAROTOMY: SHX154

## 2022-01-20 LAB — URINALYSIS, ROUTINE W REFLEX MICROSCOPIC
Bilirubin Urine: NEGATIVE
Glucose, UA: NEGATIVE mg/dL
Hgb urine dipstick: NEGATIVE
Ketones, ur: NEGATIVE mg/dL
Nitrite: NEGATIVE
Protein, ur: 30 mg/dL — AB
Specific Gravity, Urine: 1.023 (ref 1.005–1.030)
pH: 5 (ref 5.0–8.0)

## 2022-01-20 LAB — CBC WITH DIFFERENTIAL/PLATELET
Abs Immature Granulocytes: 0.05 10*3/uL (ref 0.00–0.07)
Basophils Absolute: 0 10*3/uL (ref 0.0–0.1)
Basophils Relative: 0 %
Eosinophils Absolute: 0.1 10*3/uL (ref 0.0–0.5)
Eosinophils Relative: 1 %
HCT: 32.4 % — ABNORMAL LOW (ref 36.0–46.0)
Hemoglobin: 10.3 g/dL — ABNORMAL LOW (ref 12.0–15.0)
Immature Granulocytes: 0 %
Lymphocytes Relative: 8 %
Lymphs Abs: 1.2 10*3/uL (ref 0.7–4.0)
MCH: 28.2 pg (ref 26.0–34.0)
MCHC: 31.8 g/dL (ref 30.0–36.0)
MCV: 88.8 fL (ref 80.0–100.0)
Monocytes Absolute: 0.6 10*3/uL (ref 0.1–1.0)
Monocytes Relative: 4 %
Neutro Abs: 12.3 10*3/uL — ABNORMAL HIGH (ref 1.7–7.7)
Neutrophils Relative %: 87 %
Platelets: 443 10*3/uL — ABNORMAL HIGH (ref 150–400)
RBC: 3.65 MIL/uL — ABNORMAL LOW (ref 3.87–5.11)
RDW: 13.1 % (ref 11.5–15.5)
WBC: 14.3 10*3/uL — ABNORMAL HIGH (ref 4.0–10.5)
nRBC: 0 % (ref 0.0–0.2)

## 2022-01-20 LAB — COMPREHENSIVE METABOLIC PANEL
ALT: 8 U/L (ref 0–44)
AST: 16 U/L (ref 15–41)
Albumin: 3.7 g/dL (ref 3.5–5.0)
Alkaline Phosphatase: 55 U/L (ref 38–126)
Anion gap: 13 (ref 5–15)
BUN: 8 mg/dL (ref 6–20)
CO2: 21 mmol/L — ABNORMAL LOW (ref 22–32)
Calcium: 9.2 mg/dL (ref 8.9–10.3)
Chloride: 106 mmol/L (ref 98–111)
Creatinine, Ser: 0.73 mg/dL (ref 0.44–1.00)
GFR, Estimated: >60 mL/min (ref >60)
Glucose, Bld: 129 mg/dL — ABNORMAL HIGH (ref 70–99)
Potassium: 2.4 mmol/L — CL (ref 3.5–5.1)
Sodium: 140 mmol/L (ref 135–145)
Total Bilirubin: 0.2 mg/dL — ABNORMAL LOW (ref 0.3–1.2)
Total Protein: 6.4 g/dL — ABNORMAL LOW (ref 6.5–8.1)

## 2022-01-20 LAB — TROPONIN I (HIGH SENSITIVITY)
Troponin I (High Sensitivity): 3 ng/L (ref <18)
Troponin I (High Sensitivity): 4 ng/L (ref <18)

## 2022-01-20 LAB — LACTIC ACID, PLASMA
Lactic Acid, Venous: 1.9 mmol/L (ref 0.5–1.9)
Lactic Acid, Venous: 1.6 mmol/L (ref 0.5–1.9)

## 2022-01-20 LAB — LIPASE, BLOOD: Lipase: 37 U/L (ref 11–51)

## 2022-01-20 SURGERY — LAPAROTOMY, EXPLORATORY
Anesthesia: General

## 2022-01-20 SURGERY — LAPAROTOMY, EXPLORATORY
Anesthesia: General | Site: Abdomen

## 2022-01-20 MED ORDER — 0.9 % SODIUM CHLORIDE (POUR BTL) OPTIME
TOPICAL | Status: DC | PRN
  Administered 2022-01-20 (×3): 2000 mL

## 2022-01-20 MED ORDER — MORPHINE SULFATE (PF) 2 MG/ML IV SOLN
2.0000 mg | INTRAVENOUS | Status: DC | PRN
  Administered 2022-01-20: 2 mg via INTRAVENOUS
  Administered 2022-01-20: 4 mg via INTRAVENOUS
  Filled 2022-01-20: qty 2
  Filled 2022-01-20: qty 1

## 2022-01-20 MED ORDER — PROPOFOL 10 MG/ML IV BOLUS
INTRAVENOUS | Status: AC
  Filled 2022-01-20: qty 20

## 2022-01-20 MED ORDER — SUGAMMADEX SODIUM 200 MG/2ML IV SOLN
INTRAVENOUS | Status: DC | PRN
  Administered 2022-01-20: 200 mg via INTRAVENOUS

## 2022-01-20 MED ORDER — DEXMEDETOMIDINE (PRECEDEX) IN NS 20 MCG/5ML (4 MCG/ML) IV SYRINGE
PREFILLED_SYRINGE | INTRAVENOUS | Status: DC | PRN
  Administered 2022-01-20: 12 ug via INTRAVENOUS
  Administered 2022-01-20: 8 ug via INTRAVENOUS

## 2022-01-20 MED ORDER — ONDANSETRON HCL 4 MG/2ML IJ SOLN
4.0000 mg | Freq: Four times a day (QID) | INTRAMUSCULAR | Status: DC | PRN
  Administered 2022-01-23: 4 mg via INTRAVENOUS
  Filled 2022-01-20: qty 2

## 2022-01-20 MED ORDER — LACTATED RINGERS IV BOLUS (SEPSIS)
1000.0000 mL | Freq: Once | INTRAVENOUS | Status: AC
  Administered 2022-01-20: 1000 mL via INTRAVENOUS

## 2022-01-20 MED ORDER — DEXMEDETOMIDINE HCL IN NACL 80 MCG/20ML IV SOLN
INTRAVENOUS | Status: AC
  Filled 2022-01-20: qty 20

## 2022-01-20 MED ORDER — LIDOCAINE 2% (20 MG/ML) 5 ML SYRINGE
INTRAMUSCULAR | Status: AC
  Filled 2022-01-20: qty 5

## 2022-01-20 MED ORDER — SUCCINYLCHOLINE CHLORIDE 200 MG/10ML IV SOSY
PREFILLED_SYRINGE | INTRAVENOUS | Status: DC | PRN
  Administered 2022-01-20: 100 mg via INTRAVENOUS

## 2022-01-20 MED ORDER — SODIUM CHLORIDE 0.9 % IV SOLN
20.0000 ug | Freq: Once | INTRAVENOUS | Status: AC
  Administered 2022-01-20: 20 ug via INTRAVENOUS
  Filled 2022-01-20: qty 5

## 2022-01-20 MED ORDER — POTASSIUM CHLORIDE 10 MEQ/100ML IV SOLN
10.0000 meq | Freq: Once | INTRAVENOUS | Status: AC
Start: 2022-01-20 — End: 2022-01-20
  Administered 2022-01-20: 10 meq via INTRAVENOUS
  Filled 2022-01-20: qty 100

## 2022-01-20 MED ORDER — DEXAMETHASONE SODIUM PHOSPHATE 10 MG/ML IJ SOLN
INTRAMUSCULAR | Status: DC | PRN
  Administered 2022-01-20: 5 mg via INTRAVENOUS

## 2022-01-20 MED ORDER — IOHEXOL 300 MG/ML  SOLN
100.0000 mL | Freq: Once | INTRAMUSCULAR | Status: AC | PRN
  Administered 2022-01-20: 100 mL via INTRAVENOUS

## 2022-01-20 MED ORDER — ACETAMINOPHEN 10 MG/ML IV SOLN
1000.0000 mg | Freq: Four times a day (QID) | INTRAVENOUS | Status: AC
Start: 2022-01-20 — End: 2022-01-21
  Administered 2022-01-20 – 2022-01-21 (×4): 1000 mg via INTRAVENOUS
  Filled 2022-01-20 (×4): qty 100

## 2022-01-20 MED ORDER — PIPERACILLIN-TAZOBACTAM 3.375 G IVPB
3.3750 g | Freq: Three times a day (TID) | INTRAVENOUS | Status: AC
  Administered 2022-01-20 – 2022-01-23 (×9): 3.375 g via INTRAVENOUS
  Filled 2022-01-20 (×9): qty 50

## 2022-01-20 MED ORDER — ONDANSETRON 4 MG PO TBDP: 4.0000 mg | ORAL_TABLET | Freq: Four times a day (QID) | ORAL | Status: DC | PRN

## 2022-01-20 MED ORDER — OXYCODONE HCL 5 MG/5ML PO SOLN
5.0000 mg | ORAL | Status: DC | PRN
  Administered 2022-01-20: 10 mg
  Filled 2022-01-20: qty 10

## 2022-01-20 MED ORDER — OXYCODONE-ACETAMINOPHEN 5-325 MG PO TABS
1.0000 | ORAL_TABLET | Freq: Once | ORAL | Status: AC
  Administered 2022-01-20: 1 via ORAL
  Filled 2022-01-20: qty 1

## 2022-01-20 MED ORDER — ACETAMINOPHEN 500 MG PO TABS
1000.0000 mg | ORAL_TABLET | Freq: Once | ORAL | Status: AC
  Administered 2022-01-20: 1000 mg via ORAL
  Filled 2022-01-20: qty 2

## 2022-01-20 MED ORDER — PIPERACILLIN-TAZOBACTAM 3.375 G IVPB 30 MIN
3.3750 g | Freq: Once | INTRAVENOUS | Status: AC
  Administered 2022-01-20: 3.375 g via INTRAVENOUS
  Filled 2022-01-20: qty 50

## 2022-01-20 MED ORDER — FENTANYL CITRATE (PF) 100 MCG/2ML IJ SOLN: 25.0000 ug | INTRAMUSCULAR | Status: DC | PRN

## 2022-01-20 MED ORDER — ONDANSETRON HCL 4 MG/2ML IJ SOLN
4.0000 mg | Freq: Once | INTRAMUSCULAR | Status: AC
  Administered 2022-01-20: 4 mg via INTRAVENOUS
  Filled 2022-01-20: qty 2

## 2022-01-20 MED ORDER — LACTATED RINGERS IV SOLN: INTRAVENOUS | Status: DC

## 2022-01-20 MED ORDER — ROCURONIUM BROMIDE 100 MG/10ML IV SOLN
INTRAVENOUS | Status: DC | PRN
  Administered 2022-01-20: 20 mg via INTRAVENOUS
  Administered 2022-01-20: 50 mg via INTRAVENOUS

## 2022-01-20 MED ORDER — HYDROMORPHONE HCL 1 MG/ML IJ SOLN
1.0000 mg | Freq: Once | INTRAMUSCULAR | Status: AC
  Administered 2022-01-20: 1 mg via INTRAVENOUS
  Filled 2022-01-20: qty 1

## 2022-01-20 MED ORDER — MIDAZOLAM HCL 5 MG/5ML IJ SOLN
INTRAMUSCULAR | Status: DC | PRN
  Administered 2022-01-20: 2 mg via INTRAVENOUS

## 2022-01-20 MED ORDER — MIDAZOLAM HCL 2 MG/2ML IJ SOLN
INTRAMUSCULAR | Status: AC
  Filled 2022-01-20: qty 2

## 2022-01-20 MED ORDER — LIDOCAINE HCL (CARDIAC) PF 100 MG/5ML IV SOSY
PREFILLED_SYRINGE | INTRAVENOUS | Status: DC | PRN
  Administered 2022-01-20: 60 mg via INTRAVENOUS

## 2022-01-20 MED ORDER — MORPHINE SULFATE (PF) 4 MG/ML IV SOLN
8.0000 mg | Freq: Once | INTRAVENOUS | Status: AC
  Administered 2022-01-20: 8 mg via INTRAVENOUS
  Filled 2022-01-20: qty 2

## 2022-01-20 MED ORDER — POTASSIUM CHLORIDE 10 MEQ/100ML IV SOLN
10.0000 meq | INTRAVENOUS | Status: AC
  Administered 2022-01-20 (×3): 10 meq via INTRAVENOUS
  Filled 2022-01-20 (×4): qty 100

## 2022-01-20 MED ORDER — ENOXAPARIN SODIUM 40 MG/0.4ML IJ SOSY
40.0000 mg | PREFILLED_SYRINGE | INTRAMUSCULAR | Status: DC
  Administered 2022-01-21 – 2022-01-24 (×4): 40 mg via SUBCUTANEOUS
  Filled 2022-01-20 (×4): qty 0.4

## 2022-01-20 MED ORDER — LACTATED RINGERS IV SOLN: INTRAVENOUS | Status: DC | PRN

## 2022-01-20 MED ORDER — FENTANYL CITRATE (PF) 250 MCG/5ML IJ SOLN
INTRAMUSCULAR | Status: AC
  Filled 2022-01-20: qty 5

## 2022-01-20 MED ORDER — DOCUSATE SODIUM 100 MG PO CAPS
100.0000 mg | ORAL_CAPSULE | Freq: Two times a day (BID) | ORAL | Status: DC
  Administered 2022-01-20: 100 mg via ORAL
  Filled 2022-01-20: qty 1

## 2022-01-20 MED ORDER — FLUCONAZOLE IN SODIUM CHLORIDE 400-0.9 MG/200ML-% IV SOLN
400.0000 mg | INTRAVENOUS | Status: AC
  Administered 2022-01-20 – 2022-01-23 (×4): 400 mg via INTRAVENOUS
  Filled 2022-01-20 (×4): qty 200

## 2022-01-20 MED ORDER — ONDANSETRON HCL 4 MG/2ML IJ SOLN
INTRAMUSCULAR | Status: DC | PRN
  Administered 2022-01-20: 4 mg via INTRAVENOUS

## 2022-01-20 MED ORDER — PROPOFOL 10 MG/ML IV BOLUS
INTRAVENOUS | Status: DC | PRN
  Administered 2022-01-20: 120 mg via INTRAVENOUS

## 2022-01-20 MED ORDER — FENTANYL CITRATE (PF) 100 MCG/2ML IJ SOLN
INTRAMUSCULAR | Status: DC | PRN
  Administered 2022-01-20: 100 ug via INTRAVENOUS
  Administered 2022-01-20 (×2): 50 ug via INTRAVENOUS

## 2022-01-20 MED ORDER — MORPHINE SULFATE (PF) 2 MG/ML IV SOLN
2.0000 mg | INTRAVENOUS | Status: DC | PRN
  Administered 2022-01-20 – 2022-01-21 (×9): 4 mg via INTRAVENOUS
  Administered 2022-01-21: 2 mg via INTRAVENOUS
  Administered 2022-01-21 – 2022-01-22 (×7): 4 mg via INTRAVENOUS
  Administered 2022-01-22: 2 mg via INTRAVENOUS
  Administered 2022-01-22 – 2022-01-23 (×9): 4 mg via INTRAVENOUS
  Filled 2022-01-20 (×27): qty 2

## 2022-01-20 MED ORDER — METHOCARBAMOL 1000 MG/10ML IJ SOLN
1000.0000 mg | Freq: Three times a day (TID) | INTRAVENOUS | Status: DC
  Administered 2022-01-20 – 2022-01-23 (×8): 1000 mg via INTRAVENOUS
  Filled 2022-01-20 (×2): qty 10
  Filled 2022-01-20: qty 1000
  Filled 2022-01-20: qty 10
  Filled 2022-01-20: qty 1000
  Filled 2022-01-20: qty 10
  Filled 2022-01-20: qty 1000
  Filled 2022-01-20: qty 10
  Filled 2022-01-20: qty 1000
  Filled 2022-01-20: qty 10
  Filled 2022-01-20 (×2): qty 1000

## 2022-01-20 MED ORDER — PIPERACILLIN-TAZOBACTAM 3.375 G IVPB
3.3750 g | Freq: Four times a day (QID) | INTRAVENOUS | Status: DC
  Administered 2022-01-20: 3.375 g via INTRAVENOUS
  Filled 2022-01-20: qty 50

## 2022-01-20 MED ORDER — ACETAMINOPHEN 10 MG/ML IV SOLN: 1000.0000 mg | Freq: Once | INTRAVENOUS | Status: DC | PRN

## 2022-01-20 MED ORDER — LACTATED RINGERS IV BOLUS
1000.0000 mL | Freq: Once | INTRAVENOUS | Status: AC
  Administered 2022-01-20: 1000 mL via INTRAVENOUS

## 2022-01-20 SURGICAL SUPPLY — 43 items
BLADE CLIPPER SURG (BLADE) IMPLANT
CANISTER SUCT 3000ML PPV (MISCELLANEOUS) ×1 IMPLANT
CANISTER WOUNDNEG PRESSURE 500 (CANNISTER) IMPLANT
CHLORAPREP W/TINT 26 (MISCELLANEOUS) ×1 IMPLANT
COVER SURGICAL LIGHT HANDLE (MISCELLANEOUS) ×1 IMPLANT
DRAIN CHANNEL 19F RND (DRAIN) IMPLANT
DRAPE LAPAROSCOPIC ABDOMINAL (DRAPES) ×1 IMPLANT
DRAPE UNIVERSAL (DRAPES) ×1 IMPLANT
DRAPE WARM FLUID 44X44 (DRAPES) ×1 IMPLANT
DRSG OPSITE POSTOP 4X10 (GAUZE/BANDAGES/DRESSINGS) IMPLANT
DRSG OPSITE POSTOP 4X8 (GAUZE/BANDAGES/DRESSINGS) IMPLANT
DRSG VAC ATS MED SENSATRAC (GAUZE/BANDAGES/DRESSINGS) IMPLANT
ELECT BLADE 6.5 EXT (BLADE) IMPLANT
ELECT CAUTERY BLADE 6.4 (BLADE) ×1 IMPLANT
ELECT REM PT RETURN 9FT ADLT (ELECTROSURGICAL) ×1
ELECTRODE REM PT RTRN 9FT ADLT (ELECTROSURGICAL) ×1 IMPLANT
EVACUATOR SILICONE 100CC (DRAIN) IMPLANT
GLOVE BIO SURGEON STRL SZ 6.5 (GLOVE) ×1 IMPLANT
GLOVE BIOGEL PI IND STRL 6 (GLOVE) ×1 IMPLANT
GOWN STRL REUS W/ TWL LRG LVL3 (GOWN DISPOSABLE) ×2 IMPLANT
GOWN STRL REUS W/TWL LRG LVL3 (GOWN DISPOSABLE) ×2
HANDLE SUCTION POOLE (INSTRUMENTS) ×1 IMPLANT
KIT BASIN OR (CUSTOM PROCEDURE TRAY) ×1 IMPLANT
KIT TURNOVER KIT B (KITS) ×1 IMPLANT
LIGASURE IMPACT 36 18CM CVD LR (INSTRUMENTS) IMPLANT
NS IRRIG 1000ML POUR BTL (IV SOLUTION) ×2 IMPLANT
PACK GENERAL/GYN (CUSTOM PROCEDURE TRAY) ×1 IMPLANT
PAD ARMBOARD 7.5X6 YLW CONV (MISCELLANEOUS) ×1 IMPLANT
PENCIL SMOKE EVACUATOR (MISCELLANEOUS) ×1 IMPLANT
SPONGE T-LAP 18X18 ~~LOC~~+RFID (SPONGE) IMPLANT
STAPLER VISISTAT 35W (STAPLE) ×1 IMPLANT
SUCTION POOLE HANDLE (INSTRUMENTS) ×1
SUT ETHILON 2 0 FS 18 (SUTURE) IMPLANT
SUT PDS AB 1 TP1 54 (SUTURE) IMPLANT
SUT PDS AB 1 TP1 96 (SUTURE) IMPLANT
SUT SILK 2 0 SH CR/8 (SUTURE) ×1 IMPLANT
SUT SILK 2 0 TIES 10X30 (SUTURE) ×1 IMPLANT
SUT SILK 3 0 SH CR/8 (SUTURE) ×1 IMPLANT
SUT SILK 3 0 TIES 10X30 (SUTURE) ×1 IMPLANT
SUT VIC AB 3-0 SH 18 (SUTURE) IMPLANT
TOWEL GREEN STERILE (TOWEL DISPOSABLE) ×1 IMPLANT
TRAY FOLEY MTR SLVR 16FR STAT (SET/KITS/TRAYS/PACK) IMPLANT
YANKAUER SUCT BULB TIP NO VENT (SUCTIONS) IMPLANT

## 2022-01-20 NOTE — ED Notes (Signed)
Please note that sepsis protocol with blood cultures was orders for a condition change after the initial Zosyn was given. Shared this with [facility]

## 2022-01-20 NOTE — Progress Notes (Signed)
Secure chatted with RN, Abx were given prior to [employer] x 1 was draw d/t abrupt change n Pt status r/t perforated ulcer and late Sepsis order.

## 2022-01-20 NOTE — ED Triage Notes (Signed)
Pt c/o right upper abdominal sharp pain.  Pt visitor verbalzied that pt has "passed out twice."

## 2022-01-20 NOTE — ED Provider Triage Note (Addendum)
Emergency Medicine Provider Triage Evaluation Note  Jessica Horn , a 41 y.o. female  was evaluated in triage.  Pt complains of RUQ and chest pain.  Started around 8pm while driving home after dinner.  Subsided for a while but started back again when lying down to go to bed.  States once it returned felt some pain in neck and right arm.  Reports "passed out" twice from pain.  Denies vomiting, fever, chills, diarrhea.  Hx of breast cancer, s/p total hysterectomy and oophorectomy.  Review of Systems  Positive: Abdominal pain, chest pain Negative: fever  Physical Exam  BP (!) 163/103 (BP Location: Left Arm)   Pulse 88   Temp 98.8 F (37.1 C) (Oral)   Resp 18   Ht '5\' 5"'$  (1.651 m)   Wt 64.6 kg   LMP 02/16/2014   SpO2 100%   BMI 23.70 kg/m   Gen:   Awake, no distress   Resp:  Normal effort  MSK:   Moves extremities without difficulty  Other:  Uncomfortable appearing, writhing in wheelchair  Medical Decision Making  Medically screening exam initiated at 1:01 AM.  Appropriate orders placed.  Jessica Horn was informed that the remainder of the evaluation will be completed by another provider, this initial triage assessment does not replace that evaluation, and the importance of remaining in the ED until their evaluation is complete.  RUQ and chest pain.  EKG, labs, CXR and RUQ [place] ordered.  Percocet for pain.  1:39 AM Notified by radiology that patient appears to have free air under diaphragm on right.  If answer not obvious on [place], would recommended CT AP for further evaluation.  Charge RN notified to move to next available bed.   Larene Pickett, PA-C 01/20/22 0108    Larene Pickett, PA-C 01/20/22 0140

## 2022-01-20 NOTE — ED Notes (Addendum)
Last oral intake was a salad at 2200 on 9/10.

## 2022-01-20 NOTE — Anesthesia Procedure Notes (Signed)
Procedure Name: Intubation Date/Time: 01/20/2022 6:17 AM  Performed by: Desiree Daise T, CRNAPre-anesthesia Checklist: Patient identified, Emergency Drugs available, Suction available and Patient being monitored Patient Re-evaluated:Patient Re-evaluated prior to induction Oxygen Delivery Method: Circle system utilized Preoxygenation: Pre-oxygenation with 100% oxygen Induction Type: IV induction, Rapid sequence and Cricoid Pressure applied Ventilation: Mask ventilation without difficulty Laryngoscope Size: Miller and 2 Grade View: Grade II Tube type: Oral Tube size: 7.5 mm Number of attempts: 1 Airway Equipment and Method: Stylet and Oral airway Placement Confirmation: ETT inserted through vocal cords under direct vision, positive ETCO2 and breath sounds checked- equal and bilateral Secured at: 21 cm Tube secured with: Tape Dental Injury: Teeth and Oropharynx as per pre-operative assessment

## 2022-01-20 NOTE — H&P (Addendum)
Reason for Consult/Chief Complaint: free air Consultant: Mesner, MD  Jessica Horn is an 41 y.o. female.   HPI: [name] with acute onset abdominal pain since 2000 last PM, refractory to pepto bismol. No prior h/o GERD. Significant NSAID use. CT finding c/w perforated gastric ulcer. Reports lap hyst 09/2021 and h/o vWD.   Past Medical History:  Diagnosis Date   Anemia    Chronic pain syndrome    Complication of anesthesia    per pt hard to wake   Family history of adverse reaction to anesthesia    sister---  hard to wake   History of breast cancer    per pt dx inflammatory breast cancer at age 33 without bx / lumpectomy,  completed chemo and one radiation treatment   History of COVID-19 2020   per pt moderate / severe symptoms, recovered at home,  that resolved   MDD (major depressive disorder)    Myofascial pain    Pelvic pain    Refusal of blood transfusions as patient is Jehovah's Witness    Von Willebrand disease, unspecified ([place])    09-05-2021  per pt was dx in 2013 , told barely over the line, unknown type,  pt stated never had blood clots;  however, prior to pt's hysterectomy  pt had blood test  done 02-02-2014 in care everywhere, results negative  (pt stated she is records that she gave to Dr Gaetano Net)   Wears glasses     Past Surgical History:  Procedure Laterality Date   CESAREAN SECTION WITH BILATERAL TUBAL LIGATION  10/06/2009   LAPAROSCOPIC ASSISTED VAGINAL HYSTERECTOMY Left 04/18/2014   '@[facility]'$ ;  w/ Left salpingoophorectomy and node bx   LAPAROSCOPIC LYSIS OF ADHESIONS N/A 09/09/2021   Procedure: LAPAROSCOPIC EXTENSIVE LYSIS OF ADHESIONS;  Surgeon: Everlene Farrier, MD;  Location: [facility];  Service: Gynecology;  Laterality: N/A;   LAPAROSCOPIC SALPINGO OOPHERECTOMY Right 09/09/2021   Procedure: LAPAROSCOPIC UNILATERAL SALPINGO OOPHORECTOMY;  Surgeon: Everlene Farrier, MD;  Location: [facility];  Service: Gynecology;  Laterality: Right;    OVARIAN CYST REMOVAL Right 2016    Family History  Problem Relation Age of Onset   Hypertension Mother    Anemia Mother    Kidney disease Maternal Grandmother    Diabetes Maternal Grandmother    Breast cancer Maternal Grandmother 78   Diabetes Paternal Grandfather    Breast cancer Sister 79   Breast cancer Paternal Aunt 25   Diabetes Maternal Grandfather    Cancer Paternal Grandmother        NOS   Breast cancer Paternal Aunt 35   Breast cancer Paternal Aunt    Ovarian cancer Paternal Aunt    Cancer Maternal Aunt        breast   Lupus Other    Multiple sclerosis Other     Social History:  reports that she quit smoking about 25 years ago. Her smoking use included cigarettes. She has never used smokeless tobacco. She reports that she does not currently use alcohol. She reports that she does not currently use drugs after having used the following drugs: Marijuana.  Allergies: No Known Allergies  Medications: I have reviewed the patient's current medications.  Results for orders placed or performed during the hospital encounter of 01/20/22 (from the past 48 hour(s))  CBC with Differential     Status: Abnormal   Collection Time: 01/20/22  1:15 AM  Result Value Ref Range   WBC 14.3 (H) 4.0 - 10.5 K/uL  RBC 3.65 (L) 3.87 - 5.11 MIL/uL   Hemoglobin 10.3 (L) 12.0 - 15.0 g/dL   HCT 32.4 (L) 36.0 - 46.0 %   MCV 88.8 80.0 - 100.0 fL   MCH 28.2 26.0 - 34.0 pg   MCHC 31.8 30.0 - 36.0 g/dL   RDW 13.1 11.5 - 15.5 %   Platelets 443 (H) 150 - 400 K/uL   nRBC 0.0 0.0 - 0.2 %   Neutrophils Relative % 87 %   Neutro Abs 12.3 (H) 1.7 - 7.7 K/uL   Lymphocytes Relative 8 %   Lymphs Abs 1.2 0.7 - 4.0 K/uL   Monocytes Relative 4 %   Monocytes Absolute 0.6 0.1 - 1.0 K/uL   Eosinophils Relative 1 %   Eosinophils Absolute 0.1 0.0 - 0.5 K/uL   Basophils Relative 0 %   Basophils Absolute 0.0 0.0 - 0.1 K/uL   Immature Granulocytes 0 %   Abs Immature Granulocytes 0.05 0.00 - 0.07 K/uL     Comment: Performed at [facility] [address], [place], [place] [address]  Comprehensive metabolic panel     Status: Abnormal   Collection Time: 01/20/22  1:15 AM  Result Value Ref Range   Sodium 140 135 - 145 mmol/L   Potassium 2.4 (LL) 3.5 - 5.1 mmol/L    Comment: CRITICAL RESULT CALLED TO, READ BACK BY AND VERIFIED WITH Berta Minor, RN, [phone] 01/20/22, A. RAMSEY   Chloride 106 98 - 111 mmol/L   CO2 21 (L) 22 - 32 mmol/L   Glucose, Bld 129 (H) 70 - 99 mg/dL    Comment: Glucose reference range applies only to samples taken after fasting for at least 8 hours.   BUN 8 6 - 20 mg/dL   Creatinine, Ser 0.73 0.44 - 1.00 mg/dL   Calcium 9.2 8.9 - 10.3 mg/dL   Total Protein 6.4 (L) 6.5 - 8.1 g/dL   Albumin 3.7 3.5 - 5.0 g/dL   AST 16 15 - 41 U/L   ALT 8 0 - 44 U/L   Alkaline Phosphatase 55 38 - 126 U/L   Total Bilirubin 0.2 (L) 0.3 - 1.2 mg/dL   GFR, Estimated >60 >60 mL/min    Comment: (NOTE) Calculated using the CKD-EPI Creatinine Equation (2021)    Anion gap 13 5 - 15    Comment: Performed at [facility], [place] [address], [place], [place] [address]  Lipase, blood     Status: None   Collection Time: 01/20/22  1:15 AM  Result Value Ref Range   Lipase 37 11 - 51 U/L    Comment: Performed at [facility] [address] [place],  [address]  Troponin I (High Sensitivity)     Status: None   Collection Time: 01/20/22  1:15 AM  Result Value Ref Range   Troponin I (High Sensitivity) 3 <18 ng/L    Comment: (NOTE) Elevated high sensitivity troponin I (hsTnI) values and significant  changes across serial measurements may suggest ACS but many other  chronic and acute conditions are known to elevate hsTnI results.  Refer to the "Links" section for chest pain algorithms and additional  guidance. Performed at [facility], [place] [address], [place],  [address]   Urinalysis, Routine w reflex microscopic Urine, Clean Catch     Status: Abnormal    Collection Time: 01/20/22  1:27 AM  Result Value Ref Range   Color, Urine YELLOW YELLOW   APPearance HAZY (A) CLEAR  Specific Gravity, Urine 1.023 1.005 - 1.030   pH 5.0 5.0 - 8.0   Glucose, UA NEGATIVE NEGATIVE mg/dL   Hgb urine dipstick NEGATIVE NEGATIVE   Bilirubin Urine NEGATIVE NEGATIVE   Ketones, ur NEGATIVE NEGATIVE mg/dL   Protein, ur 30 (A) NEGATIVE mg/dL   Nitrite NEGATIVE NEGATIVE   Leukocytes,Ua LARGE (A) NEGATIVE   RBC / HPF 0-5 0 - 5 RBC/hpf   WBC, UA 0-5 0 - 5 WBC/hpf   Bacteria, UA FEW (A) NONE SEEN   Squamous Epithelial / LPF 11-20 0 - 5   Mucus PRESENT     Comment: Performed at [facility], [place] [address], [place], [place] [address]  Lactic acid, plasma     Status: None   Collection Time: 01/20/22  2:08 AM  Result Value Ref Range   Lactic Acid, Venous 1.9 0.5 - 1.9 mmol/L    Comment: Performed at [facility] [address], [place], [place] [address]  Troponin I (High Sensitivity)     Status: None   Collection Time: 01/20/22  3:01 AM  Result Value Ref Range   Troponin I (High Sensitivity) 4 <18 ng/L    Comment: (NOTE) Elevated high sensitivity troponin I (hsTnI) values and significant  changes across serial measurements may suggest ACS but many other  chronic and acute conditions are known to elevate hsTnI results.  Refer to the "Links" section for chest pain algorithms and additional  guidance. Performed at [facility], [place] [address] [place], [place] [address]     CT ABDOMEN PELVIS W CONTRAST  Result Date: 01/20/2022 CLINICAL DATA:  Severe right upper abdominal pain EXAM: CT ABDOMEN AND PELVIS WITH CONTRAST TECHNIQUE: Multidetector CT imaging of the abdomen and pelvis was performed using the standard protocol following bolus administration of intravenous contrast. RADIATION DOSE REDUCTION: This exam was performed according to the departmental dose-optimization program which includes automated exposure control, adjustment of the mA  and/or kV according to patient size and/or use of iterative reconstruction technique. CONTRAST:  150m OMNIPAQUE IOHEXOL 300 MG/ML  SOLN COMPARISON:  12/08/2014 FINDINGS: Lower chest: Lung bases are clear. Hepatobiliary: Liver is within normal limits. Gallbladder is unremarkable. No intrahepatic or extrahepatic duct dilatation. Pancreas: Within normal limits. Spleen: Within normal limits. Adrenals/Urinary Tract: Adrenal glands are within normal limits. Kidneys are within normal limits.  No hydronephrosis. Bladder is within normal limits. Stomach/Bowel: Stomach is notable for gastric wall thickening along the antrum (series 3/image 43), with adjacent stranding in the perigastric fat. Given the associated findings (described below), this raises concern for a perforated gastric ulcer. No evidence of bowel obstruction. Appendix is not discretely visualized. Gas-filled outpouching arising from the transverse colon in the anterior mid abdomen (coronal image 42). However, there does not appear to be associated inflammatory changes, favoring a large colonic diverticulum over localized perforation of the colon. Moderate colonic stool burden, suggesting mild constipation. Vascular/Lymphatic: No evidence of abdominal aortic aneurysm. No suspicious abdominopelvic lymphadenopathy. Reproductive: Status post hysterectomy. No adnexal masses. Other: Small volume perihepatic ascites. Associated small volume free air beneath the diaphragm (series 3/image 20) with additional tiny foci of gas in the porta hepatis (series 3/image 34). Musculoskeletal: Visualized osseous structures are within normal limits. IMPRESSION: Small volume free air, as described above, reflecting a perforated hollow viscus. Given associated wall thickening of the gastric antrum with perigastric inflammatory changes, this appearance favors a perforated gastric ulcer. Surgical consultation is suggested. Small volume perihepatic ascites. Additional ancillary  findings as  above. Critical Value/emergent results were called by telephone at the time of interpretation on 01/20/2022 at 3:20 am to provider Montine Circle , who verbally acknowledged these results. Electronically Signed   By: Julian Hy M.D.   On: 01/20/2022 03:22   US Abdomen Limited RUQ (LIVER/GB)  Result Date: 01/20/2022 CLINICAL DATA:  Right upper quadrant pain EXAM: ULTRASOUND ABDOMEN LIMITED RIGHT UPPER QUADRANT COMPARISON:  None Available. FINDINGS: Gallbladder: No gallstones or wall thickening visualized. No sonographic Murphy sign noted by sonographer. Common bile duct: Diameter: 6 mm within normal limits. No intrahepatic biliary ductal dilatation. Liver: No focal lesion identified. Increased parenchymal echogenicity. Portal vein is patent on color Doppler imaging with normal direction of blood flow towards the liver. Other: Possible trace perihepatic fluid at the anterior aspect of the right hepatic lobe IMPRESSION: 1. No cholelithiasis or evidence of cholecystitis. 2. Increased hepatic parenchymal echogenicity, as can be seen with hepatic steatosis. 3. Possible trace perihepatic fluid along the right hepatic lobe. Electronically Signed   By: Merilyn Baba M.D.   On: 01/20/2022 02:46   DG Chest 2 View  Result Date: 01/20/2022 CLINICAL DATA:  Chest and abdominal pain EXAM: CHEST - 2 VIEW COMPARISON:  Radiographs 04/19/2012 FINDINGS: Lucencies under both hemidiaphragms concerning for free intraperitoneal air. No focal consolidation, pleural effusion, or pneumothorax. Normal cardiomediastinal silhouette. No acute osseous abnormality. IMPRESSION: Lucencies under both hemidiaphragms concerning for free intraperitoneal air. Recommend CT abdomen and pelvis for further evaluation. No acute cardiopulmonary process. Critical Value/emergent results were called by telephone at the time of interpretation on 01/20/2022 at 1:38 am to provider [facility] , who verbally acknowledged these results.  Electronically Signed   By: Placido Sou M.D.   On: 01/20/2022 01:39    ROS 10 point review of systems is negative except as listed above in HPI.   Physical Exam Blood pressure 136/89, pulse (!) 104, temperature (!) 100.9 F (38.3 C), temperature source Oral, resp. rate 19, height '5\' 5"'$  (1.651 m), weight 64.6 kg, last menstrual period 02/16/2014, SpO2 99 %. Constitutional: well-developed, well-nourished HEENT: pupils equal, round, reactive to light, 5m b/l, moist conjunctiva, external inspection of ears and nose normal, hearing intact Oropharynx: normal oropharyngeal mucosa, normal dentition Neck: no thyromegaly, trachea midline, no midline cervical tenderness to palpation Chest: breath sounds equal bilaterally, normal respiratory effort, no midline or lateral chest wall tenderness to palpation/deformity Abdomen: peritonitis GU: normal female genitalia  Back: no wounds, no thoracic/lumbar spine tenderness to palpation, no thoracic/lumbar spine stepoffs Rectal: deferred Extremities: 2+ radial and pedal pulses bilaterally, intact motor and sensation bilateral UE and LE, no peripheral edema MSK: unable to assess gait/station, no clubbing/cyanosis of fingers/toes, normal ROM of all four extremities Skin: warm, dry, no rashes Psych: normal memory, normal mood/affect     Assessment/Plan: 52F with free air likely from perforated gastric ulcer. Plan for exlap. Single dose DDAVP. Okay to receive albumin, but no other blood products. Informed consent obtained from the patient after detail explanation of risks and benefits. All questions answered.    AJesusita Oka MD General and [facility]

## 2022-01-20 NOTE — Anesthesia Preprocedure Evaluation (Addendum)
Anesthesia Evaluation  Patient identified by MRN, date of birth, ID band Patient awake    Reviewed: Allergy & Precautions, NPO status , Patient's Chart, lab work & pertinent test results  History of Anesthesia Complications (+) Family history of anesthesia reaction  Airway Mallampati: I  TM Distance: >3 FB Neck ROM: Full    Dental no notable dental hx.    Pulmonary neg pulmonary ROS, former smoker,    Pulmonary exam normal        Cardiovascular negative cardio ROS   Rhythm:Regular Rate:Normal     Neuro/Psych  Headaches, Anxiety Depression    GI/Hepatic Neg liver ROS, PUD, GERD  ,Perforated gastric ulcer   Endo/Other  negative endocrine ROS  Renal/GU negative Renal ROS  negative genitourinary   Musculoskeletal   Abdominal Normal abdominal exam  (+)   Peds  Hematology  (+) Blood dyscrasia (vWD), anemia , REFUSES BLOOD PRODUCTS (will accept albumin), [facility] WITNESSLab Results      Component                Value               Date                      WBC                      14.3 (H)            01/20/2022                HGB                      10.3 (L)            01/20/2022                HCT                      32.4 (L)            01/20/2022                MCV                      88.8                01/20/2022                PLT                      443 (H)             01/20/2022           Lab Results      Component                Value               Date                      NA                       140                 01/20/2022                K  2.4 (LL)            01/20/2022                CO2                      21 (L)              01/20/2022                GLUCOSE                  129 (H)             01/20/2022                BUN                      8                   01/20/2022                CREATININE               0.73                01/20/2022                CALCIUM                   9.2                 01/20/2022                GFRNONAA                 >60                 01/20/2022             Anesthesia Other Findings   Reproductive/Obstetrics                            Anesthesia Physical Anesthesia Plan  ASA: 3 and emergent  Anesthesia Plan: General   Post-op Pain Management:    Induction: Intravenous and Rapid sequence  PONV Risk Score and Plan: 3 and Ondansetron, Dexamethasone, Midazolam and Treatment may vary due to age or medical condition  Airway Management Planned: Mask and Oral ETT  Additional Equipment: None  Intra-op Plan:   Post-operative Plan: Extubation in OR  Informed Consent: I have reviewed the patients History and Physical, chart, labs and discussed the procedure including the risks, benefits and alternatives for the proposed anesthesia with the patient or authorized representative who has indicated his/her understanding and acceptance.     Dental advisory given  Plan Discussed with: CRNA  Anesthesia Plan Comments:        Anesthesia Quick Evaluation

## 2022-01-20 NOTE — ED Notes (Signed)
Transported to CT 

## 2022-01-20 NOTE — Progress Notes (Signed)
Pt being followed by [facility] for Sepsis protocol. 

## 2022-01-20 NOTE — Transfer of Care (Addendum)
Immediate Anesthesia Transfer of Care Note  Patient: Jessica Horn  Procedure(s) Performed: EXPLORATORY LAPAROTOMY; MODIFIED GRAHAM PATCH (Abdomen)  Patient Location: PACU  Anesthesia Type:General  Level of Consciousness: drowsy and patient cooperative  Airway & Oxygen Therapy: Patient Spontanous Breathing and Patient connected to face mask oxygen  Post-op Assessment: Report given to RN  Post vital signs: Reviewed and stable  Last Vitals:  Vitals Value Taken Time  BP 120/90   Temp    Pulse 38 01/20/22 0750  Resp 22 01/20/22 0750  SpO2 100 % 01/20/22 0750  Vitals shown include unvalidated device data.  Last Pain:  Vitals:   01/20/22 0459  TempSrc: Oral  PainSc:          Complications: No notable events documented.

## 2022-01-20 NOTE — ED Provider Notes (Signed)
[facility] Emergency Department Provider Note MRN:  [id number]  Arrival date & time: 01/20/22     Chief Complaint   Abdominal Pain   History of Present Illness   Jessica Horn is a 41 y.o. year-old female presents to the ED with chief complaint of sudden onset, severe, upper abdominal pain that started around 8pm.  She reports associated nausea, but denies vomiting.  She states that the pain radiates to her right shoulder.  Denies injury or trauma.  .     Review of Systems  Pertinent positive and negative review of systems noted in HPI.    Physical Exam   Vitals:   01/20/22 0515 01/20/22 0530  BP: 136/89 (!) 144/89  Pulse: (!) 104 (!) 116  Resp: 19 18  Temp:    SpO2: 99% 98%    CONSTITUTIONAL:  uncomfortable-appearing, NAD NEURO:  Alert and oriented x 3, CN 3-12 grossly intact EYES:  eyes equal and reactive ENT/NECK:  Supple, no stridor  CARDIO:  tachycardic, regular rhythm, appears well-perfused  PULM:  No respiratory distress, CTAB GI/GU:  non-distended, diffuse upper abdominal tenderness MSK/SPINE:  No gross deformities, no edema, moves all extremities  SKIN:  no rash, atraumatic   *Additional and/or pertinent findings included in MDM below  Diagnostic and Interventional Summary    EKG Interpretation  Date/Time:  Monday January 20 2022 01:09:20 EDT Ventricular Rate:  92 PR Interval:  192 QRS Duration: 86 QT Interval:  372 QTC Calculation: 460 R Axis:   34 Text Interpretation: Normal sinus rhythm T wave abnormality, consider inferior ischemia T wave abnormality, consider anterolateral ischemia Prolonged QT Abnormal ECG Confirmed by Veryl Speak [phone]) on 01/20/2022 1:26:43 AM       Labs Reviewed  CBC WITH DIFFERENTIAL/PLATELET - Abnormal; Notable for the following components:      Result Value   WBC 14.3 (*)    RBC 3.65 (*)    Hemoglobin 10.3 (*)    HCT 32.4 (*)    Platelets 443 (*)    Neutro Abs 12.3 (*)    All  other components within normal limits  COMPREHENSIVE METABOLIC PANEL - Abnormal; Notable for the following components:   Potassium 2.4 (*)    CO2 21 (*)    Glucose, Bld 129 (*)    Total Protein 6.4 (*)    Total Bilirubin 0.2 (*)    All other components within normal limits  URINALYSIS, ROUTINE W REFLEX MICROSCOPIC - Abnormal; Notable for the following components:   APPearance HAZY (*)    Protein, ur 30 (*)    Leukocytes,Ua LARGE (*)    Bacteria, UA FEW (*)    All other components within normal limits  CULTURE, BLOOD (SINGLE)  LIPASE, BLOOD  LACTIC ACID, PLASMA  LACTIC ACID, PLASMA  TROPONIN I (HIGH SENSITIVITY)  TROPONIN I (HIGH SENSITIVITY)    CT ABDOMEN PELVIS W CONTRAST  Final Result    US Abdomen Limited RUQ (LIVER/GB)  Final Result    DG Chest 2 View  Final Result      Medications  potassium chloride 10 mEq in 100 mL IVPB (10 mEq Intravenous New Bag/Given 01/20/22 0519)  lactated ringers infusion ( Intravenous New Bag/Given 01/20/22 0514)  lactated ringers bolus 1,000 mL (1,000 mLs Intravenous New Bag/Given 01/20/22 0515)    And  lactated ringers bolus 1,000 mL (1,000 mLs Intravenous New Bag/Given 01/20/22 0514)  desmopressin (DDAVP) 20 mcg in sodium chloride 0.9 % 50 mL IVPB (has no administration in time range)  oxyCODONE-acetaminophen (PERCOCET/ROXICET) 5-325 MG per tablet 1 tablet (1 tablet Oral Given 01/20/22 0129)  HYDROmorphone (DILAUDID) injection 1 mg (1 mg Intravenous Given 01/20/22 0222)  piperacillin-tazobactam (ZOSYN) IVPB 3.375 g (0 g Intravenous Stopped 01/20/22 0319)  ondansetron (ZOFRAN) injection 4 mg (4 mg Intravenous Given 01/20/22 0233)  lactated ringers bolus 1,000 mL (0 mLs Intravenous Stopped 01/20/22 0358)  iohexol (OMNIPAQUE) 300 MG/ML solution 100 mL (100 mLs Intravenous Contrast Given 01/20/22 0259)  morphine (PF) 4 MG/ML injection 8 mg (8 mg Intravenous Given 01/20/22 0249)  HYDROmorphone (DILAUDID) injection 1 mg (1 mg Intravenous Given 01/20/22  0426)  acetaminophen (TYLENOL) tablet 1,000 mg (1,000 mg Oral Given 01/20/22 0520)     Procedures  /  Critical Care .Critical Care  Performed by: Montine Circle, PA-C Authorized by: Montine Circle, PA-C   Critical care provider statement:    Critical care time (minutes):  50   Critical care was necessary to treat or prevent imminent or life-threatening deterioration of the following conditions:  Sepsis (free air in abdomen)   Critical care was time spent personally by me on the following activities:  Development of treatment plan with patient or surrogate, discussions with consultants, evaluation of patient's response to treatment, examination of patient, ordering and review of laboratory studies, ordering and review of radiographic studies, ordering and performing treatments and interventions, pulse oximetry, re-evaluation of patient's condition and review of old charts   ED Course and Medical Decision Making  I have reviewed the triage vital signs, the nursing notes, and pertinent available records from the EMR.  Social Determinants Affecting Complexity of Care: Patient has no clinically significant social determinants affecting this chief complaint..   ED Course:    Medical Decision Making Patient here with severe upper abdominal pain.  Onset was suddenly tonight.  CXR concerning for free air in abdomen.  CT ordered.  Amount and/or Complexity of Data Reviewed Labs: ordered.    Details: Leukocytosis to 14.3 is noted, lactic acid level pending, potassium 2.4 we will give supplemental potassium IV Radiology: ordered and independent interpretation performed.    Details: Chest x-ray shows free air  Risk OTC drugs. Prescription drug management. Decision regarding hospitalization.     Consultants: [place] [facility] consulted [phone] Consulted again Several more attempts to consult failed.   I discussed the case with Dr. Bobbye Morton, who is appreciated for seeing the  patient.   Treatment and Plan: Patient's exam and diagnostic results are concerning for perforated gastric ulcer.  Feel that patient will need admission to the hospital for further treatment and evaluation.    Final Clinical Impressions(s) / ED Diagnoses     ICD-10-CM   1. Acute gastric ulcer with perforation ([place])  K25.1       ED Discharge Orders     None         Discharge Instructions Discussed with and Provided to Patient:   Discharge Instructions   None      Montine Circle, PA-C 01/20/22 0542    Mesner, Corene Cornea, MD 01/20/22 [id number]

## 2022-01-20 NOTE — Op Note (Signed)
   Operative Note   Date: 01/20/2022  Procedure: exploratory laparotomy, modified Graham patch repair, abdominal washout, JP drain placement, incisional wound vac application [id number]  Pre-op diagnosis: pneumoperitoneum, suspected perforated gastric ulcer Post-op diagnosis: perforated gastric ulcer  Indication and clinical history: The patient is a 41 y.o. year old female with pneumoperitoneum, suspected perforated gastric ulcer     Surgeon: Jesusita Oka, MD  Anesthesiologist: Gloris Manchester, MD Anesthesia: General  Findings:  Specimen: none EBL: 15cc Drains/Implants: 108F JP, RLR, traversing subhepatic space and repair  Disposition: PACU - hemodynamically stable.  Description of procedure: The patient was positioned supine on the operating room table. General anesthetic induction and intubation were uneventful. Foley catheter insertion was performed and was atraumatic. Time-out was performed verifying correct patient, procedure, signature of informed consent, and administration of pre-operative antibiotics. The abdomen was prepped and draped in the usual sterile fashion.  An upper midline incision was made and deepened down through the fascia until the abdominal cavity was entered.  Copious amounts of gastric contents were immediately encountered and suctioned.  The abdomen was explored and a 1 cm gastric ulcer was identified.  Spillage was controlled with a Babcock and the remainder of the abdomen explored.  No other abnormality was identified, the small bowel was run in its entirety.  The stomach was repaired using a modified Graham patch method and the falciform ligament.  The abdomen was copiously irrigated until the fluid returned clear.  A JP drain was placed in the right lower quadrant traversing the subhepatic space and the [facility].  The fascia was closed with #1 looped PDS suture.  An incisional VAC was placed as sterile dressings.  All sponge and instrument counts  were correct at the conclusion of the procedure. The patient was awakened from anesthesia, extubated uneventfully, and transported to the PACU in good condition. There were no complications.   Upon entering the abdomen (organ space), I encountered feculent peritonitis.  CASE DATA:  Type of patient?: DOW CASE (Surgical Hospitalist [facility] In[facility])  Status of Case? EMERGENT Add On  Infection Present At Time Of Surgery (PATOS)?  FECULENT PERITONITIS    Jesusita Oka, MD General and [place] [facility]

## 2022-01-20 NOTE — Anesthesia Postprocedure Evaluation (Signed)
Anesthesia Post Note  Patient: Jessica Horn  Procedure(s) Performed: EXPLORATORY LAPAROTOMY; MODIFIED GRAHAM PATCH (Abdomen)     Patient location during evaluation: PACU Anesthesia Type: General Level of consciousness: awake and alert Pain management: pain level controlled Vital Signs Assessment: post-procedure vital signs reviewed and stable Respiratory status: spontaneous breathing, nonlabored ventilation, respiratory function stable and patient connected to nasal cannula oxygen Cardiovascular status: blood pressure returned to baseline and stable Postop Assessment: no apparent nausea or vomiting Anesthetic complications: no   No notable events documented.  Last Vitals:  Vitals:   01/20/22 1000 01/20/22 1515  BP:  (!) 152/87  Pulse:  89  Resp:  16  Temp:  37.6 C  SpO2: 98% 100%    Last Pain:  Vitals:   01/20/22 1604  TempSrc:   PainSc: 2                  Nima Bamburg L Danesha Kirchoff

## 2022-01-21 ENCOUNTER — Encounter (HOSPITAL_COMMUNITY): Payer: Self-pay | Admitting: Surgery

## 2022-01-21 LAB — BASIC METABOLIC PANEL
Anion gap: 14 (ref 5–15)
BUN: 7 mg/dL (ref 6–20)
CO2: 23 mmol/L (ref 22–32)
Calcium: 8.5 mg/dL — ABNORMAL LOW (ref 8.9–10.3)
Chloride: 102 mmol/L (ref 98–111)
Creatinine, Ser: 0.65 mg/dL (ref 0.44–1.00)
GFR, Estimated: >60 mL/min (ref >60)
Glucose, Bld: 132 mg/dL — ABNORMAL HIGH (ref 70–99)
Potassium: 2.4 mmol/L — CL (ref 3.5–5.1)
Sodium: 139 mmol/L (ref 135–145)

## 2022-01-21 LAB — H PYLORI, IGM, IGG, IGA AB
H Pylori IgG: 0.12 Index Value (ref 0.00–0.79)
H. Pylogi, Iga Abs: <9 units (ref 0.0–8.9)
H. Pylogi, Igm Abs: <9 units (ref 0.0–8.9)

## 2022-01-21 LAB — CBC
HCT: 27.6 % — ABNORMAL LOW (ref 36.0–46.0)
Hemoglobin: 9 g/dL — ABNORMAL LOW (ref 12.0–15.0)
MCH: 27.8 pg (ref 26.0–34.0)
MCHC: 32.6 g/dL (ref 30.0–36.0)
MCV: 85.2 fL (ref 80.0–100.0)
Platelets: 330 10*3/uL (ref 150–400)
RBC: 3.24 MIL/uL — ABNORMAL LOW (ref 3.87–5.11)
RDW: 13 % (ref 11.5–15.5)
WBC: 17.3 10*3/uL — ABNORMAL HIGH (ref 4.0–10.5)
nRBC: 0 % (ref 0.0–0.2)

## 2022-01-21 LAB — MAGNESIUM
Magnesium: 1.5 mg/dL — ABNORMAL LOW (ref 1.7–2.4)
Magnesium: 2.7 mg/dL — ABNORMAL HIGH (ref 1.7–2.4)

## 2022-01-21 LAB — POTASSIUM: Potassium: 3.3 mmol/L — ABNORMAL LOW (ref 3.5–5.1)

## 2022-01-21 LAB — HIV ANTIBODY (ROUTINE TESTING W REFLEX): HIV Screen 4th Generation wRfx: NONREACTIVE

## 2022-01-21 MED ORDER — MAGNESIUM SULFATE 4 GM/100ML IV SOLN
4.0000 g | Freq: Once | INTRAVENOUS | Status: AC
  Administered 2022-01-21: 4 g via INTRAVENOUS
  Filled 2022-01-21: qty 100

## 2022-01-21 MED ORDER — MAGNESIUM SULFATE 2 GM/50ML IV SOLN
2.0000 g | Freq: Once | INTRAVENOUS | Status: AC
  Administered 2022-01-21: 2 g via INTRAVENOUS
  Filled 2022-01-21: qty 50

## 2022-01-21 MED ORDER — POTASSIUM CHLORIDE 10 MEQ/100ML IV SOLN
10.0000 meq | INTRAVENOUS | Status: AC
  Administered 2022-01-21: 10 meq via INTRAVENOUS
  Filled 2022-01-21: qty 100

## 2022-01-21 MED ORDER — POTASSIUM CHLORIDE 2 MEQ/ML IV SOLN
INTRAVENOUS | Status: DC
  Filled 2022-01-21 (×10): qty 1000

## 2022-01-21 MED ORDER — ACETAMINOPHEN 10 MG/ML IV SOLN
1000.0000 mg | Freq: Four times a day (QID) | INTRAVENOUS | Status: AC
  Administered 2022-01-21 – 2022-01-22 (×4): 1000 mg via INTRAVENOUS
  Filled 2022-01-21 (×4): qty 100

## 2022-01-21 NOTE — Progress Notes (Addendum)
1 Day Post-Op  Subjective: Patient pulled her NGT out yesterday as she said it was making her gag.  No currently nausea.  Foley in place.  Pain better controlled after some adjustments overnight  ROS: See above, otherwise other systems negative  Objective: Vital signs in last 24 hours: Temp:  [98.6 F (37 C)-99.9 F (37.7 C)] 98.6 F (37 C) (09/12 0755) Pulse Rate:  [69-102] 80 (09/12 0755) Resp:  [16-22] 17 (09/12 0755) BP: (126-152)/(76-91) 132/91 (09/12 0755) SpO2:  [97 %-100 %] 98 % (09/12 0755) Last BM Date : 01/19/22  Intake/Output from previous day: 09/11 0701 - 09/12 0700 In: 2605.9 [I.V.:1887.4; IV Piggyback:718.6] Out: 2700 [Urine:2525; Emesis/NG output:10; Drains:115; Blood:50] Intake/Output this shift: No intake/output data recorded.  PE: Gen: NAD Abd: soft, appropriately tender, midline VAC in place.  JP with serous drainage.   GU: foley in place with pale clear urine  Lab Results:  Recent Labs    01/20/22 0115 01/21/22 0044  WBC 14.3* 17.3*  HGB 10.3* 9.0*  HCT 32.4* 27.6*  PLT 443* 330   BMET Recent Labs    01/20/22 0115 01/21/22 0044  NA 140 139  K 2.4* 2.4*  CL 106 102  CO2 21* 23  GLUCOSE 129* 132*  BUN 8 7  CREATININE 0.73 0.65  CALCIUM 9.2 8.5*   PT/INR No results for input(s): "LABPROT", "INR" in the last 72 hours. CMP     Component Value Date/Time   NA 139 01/21/2022 0044   K 2.4 (LL) 01/21/2022 0044   CL 102 01/21/2022 0044   CO2 23 01/21/2022 0044   GLUCOSE 132 (H) 01/21/2022 0044   BUN 7 01/21/2022 0044   CREATININE 0.65 01/21/2022 0044   CREATININE 0.70 06/11/2012 1640   CALCIUM 8.5 (L) 01/21/2022 0044   PROT 6.4 (L) 01/20/2022 0115   ALBUMIN 3.7 01/20/2022 0115   AST 16 01/20/2022 0115   ALT 8 01/20/2022 0115   ALKPHOS 55 01/20/2022 0115   BILITOT 0.2 (L) 01/20/2022 0115   GFRNONAA >60 01/21/2022 0044   GFRAA >60 06/05/2018 1830   Lipase     Component Value Date/Time   LIPASE 37 01/20/2022 0115        Studies/Results: CT ABDOMEN PELVIS W CONTRAST  Result Date: 01/20/2022 CLINICAL DATA:  Severe right upper abdominal pain EXAM: CT ABDOMEN AND PELVIS WITH CONTRAST TECHNIQUE: Multidetector CT imaging of the abdomen and pelvis was performed using the standard protocol following bolus administration of intravenous contrast. RADIATION DOSE REDUCTION: This exam was performed according to the departmental dose-optimization program which includes automated exposure control, adjustment of the mA and/or kV according to patient size and/or use of iterative reconstruction technique. CONTRAST:  156m OMNIPAQUE IOHEXOL 300 MG/ML  SOLN COMPARISON:  12/08/2014 FINDINGS: Lower chest: Lung bases are clear. Hepatobiliary: Liver is within normal limits. Gallbladder is unremarkable. No intrahepatic or extrahepatic duct dilatation. Pancreas: Within normal limits. Spleen: Within normal limits. Adrenals/Urinary Tract: Adrenal glands are within normal limits. Kidneys are within normal limits.  No hydronephrosis. Bladder is within normal limits. Stomach/Bowel: Stomach is notable for gastric wall thickening along the antrum (series 3/image 43), with adjacent stranding in the perigastric fat. Given the associated findings (described below), this raises concern for a perforated gastric ulcer. No evidence of bowel obstruction. Appendix is not discretely visualized. Gas-filled outpouching arising from the transverse colon in the anterior mid abdomen (coronal image 42). However, there does not appear to be associated inflammatory changes, favoring a large colonic diverticulum over  localized perforation of the colon. Moderate colonic stool burden, suggesting mild constipation. Vascular/Lymphatic: No evidence of abdominal aortic aneurysm. No suspicious abdominopelvic lymphadenopathy. Reproductive: Status post hysterectomy. No adnexal masses. Other: Small volume perihepatic ascites. Associated small volume free air beneath the  diaphragm (series 3/image 20) with additional tiny foci of gas in the porta hepatis (series 3/image 34). Musculoskeletal: Visualized osseous structures are within normal limits. IMPRESSION: Small volume free air, as described above, reflecting a perforated hollow viscus. Given associated wall thickening of the gastric antrum with perigastric inflammatory changes, this appearance favors a perforated gastric ulcer. Surgical consultation is suggested. Small volume perihepatic ascites. Additional ancillary findings as above. Critical Value/emergent results were called by telephone at the time of interpretation on 01/20/2022 at 3:20 am to provider Montine Circle , who verbally acknowledged these results. Electronically Signed   By: Julian Hy M.D.   On: 01/20/2022 03:22   US Abdomen Limited RUQ (LIVER/GB)  Result Date: 01/20/2022 CLINICAL DATA:  Right upper quadrant pain EXAM: ULTRASOUND ABDOMEN LIMITED RIGHT UPPER QUADRANT COMPARISON:  None Available. FINDINGS: Gallbladder: No gallstones or wall thickening visualized. No sonographic Murphy sign noted by sonographer. Common bile duct: Diameter: 6 mm within normal limits. No intrahepatic biliary ductal dilatation. Liver: No focal lesion identified. Increased parenchymal echogenicity. Portal vein is patent on color Doppler imaging with normal direction of blood flow towards the liver. Other: Possible trace perihepatic fluid at the anterior aspect of the right hepatic lobe IMPRESSION: 1. No cholelithiasis or evidence of cholecystitis. 2. Increased hepatic parenchymal echogenicity, as can be seen with hepatic steatosis. 3. Possible trace perihepatic fluid along the right hepatic lobe. Electronically Signed   By: Merilyn Baba M.D.   On: 01/20/2022 02:46   DG Chest 2 View  Result Date: 01/20/2022 CLINICAL DATA:  Chest and abdominal pain EXAM: CHEST - 2 VIEW COMPARISON:  Radiographs 04/19/2012 FINDINGS: Lucencies under both hemidiaphragms concerning for free  intraperitoneal air. No focal consolidation, pleural effusion, or pneumothorax. Normal cardiomediastinal silhouette. No acute osseous abnormality. IMPRESSION: Lucencies under both hemidiaphragms concerning for free intraperitoneal air. Recommend CT abdomen and pelvis for further evaluation. No acute cardiopulmonary process. Critical Value/emergent results were called by telephone at the time of interpretation on 01/20/2022 at 1:38 am to provider [facility] , who verbally acknowledged these results. Electronically Signed   By: Placido Sou M.D.   On: 01/20/2022 01:39    Anti-infectives: Anti-infectives (From admission, onward)    Start     Dose/Rate Route Frequency Ordered Stop   01/20/22 2200  piperacillin-tazobactam (ZOSYN) IVPB 3.375 g        3.375 g 12.5 mL/hr over 240 Minutes Intravenous Every 8 hours 01/20/22 1354     01/20/22 1200  piperacillin-tazobactam (ZOSYN) IVPB 3.375 g  Status:  Discontinued        3.375 g 12.5 mL/hr over 240 Minutes Intravenous Every 6 hours 01/20/22 1037 01/20/22 1354   01/20/22 0600  fluconazole (DIFLUCAN) IVPB 400 mg        400 mg 100 mL/hr over 120 Minutes Intravenous Every 24 hours 01/20/22 0554 01/23/22 2359   01/20/22 0215  piperacillin-tazobactam (ZOSYN) IVPB 3.375 g        3.375 g 100 mL/hr over 30 Minutes Intravenous  Once 01/20/22 0213 01/20/22 0319        Assessment/Plan POD 1, s/p ex lap with graham patch repair of perforated gastric ulcer with JP drain and wound VAC placement, Dr. Bobbye Morton 9/11 -patient pulled NGT out.  Discussed importance.  Will replace if +N/V.  Strict NPO for now though. -UGI on Thursday 9/14 -DC foley -ambulate -h. Pylori pending -wound VAC changes to start 9/13 Ambulate -cont zosyn 9/11 --> -WBC up slightly today to 17 from 14.  Cont to monitor  FEN - NPO/IVFs, replace K and mag VTE - lovenox ID - zosyn  Hypokalemia/hypomagnesemia - replace K and magnesium today.  Recheck in am (patient refusing K due to  burning despite education on importance given how low it is, slowing the infusion down as much as possible, and offering a heating pack) Von Willebrand's disease Jehovah's witness   LOS: 1 day    Jessica Horn , [facility] 01/21/2022, 9:09 AM Please see Amion for pager number during day hours 7:00am-4:30pm or 7:00am -11:30am on weekends

## 2022-01-21 NOTE — Progress Notes (Signed)
Received call from lab for critical result of K 2.4, called [facility] on call, awaiting for response.

## 2022-01-21 NOTE — Progress Notes (Addendum)
Cannot tolerate K IV due to burning sensation in the PIV site, MD notified, order was changed. Kcl replacement titrated to lower rate first till she tolerated it.

## 2022-01-21 NOTE — Progress Notes (Signed)
Mobility Specialist - Progress Note   01/21/22 1352  Mobility  Activity Ambulated with assistance in hallway  Level of Assistance Contact guard assist, steadying assist  Assistive Device Other (Comment) (IV Pole)  Distance Ambulated (ft) 40 ft  Activity Response Tolerated fair  $Mobility charge 1 Mobility    Pt received in bed and agreeable to mobility. C/o 9/10 pain at surgical site. Remained hunched forward throughout ambulation. Took standing break x1. Left EOB w/ call bell in reach and all needs met.   Paulla Dolly Mobility Specialist

## 2022-01-21 NOTE — Progress Notes (Signed)
Mobility Specialist - Progress Note   01/21/22 1355  Mobility  Activity Ambulated with assistance in room  Level of Assistance Contact guard assist, steadying assist  Assistive Device Other (Comment) (IV Pole)  Distance Ambulated (ft) 5 ft  Activity Response Tolerated poorly  $Mobility charge 1 Mobility    Pt received EOB asking to ambulate to BR. Declined further mobility while standing at side of bed d/t pain. Asked to sit back in bed and use BR at a later time. Will follow up as time allows.   Paulla Dolly Mobility Specialist

## 2022-01-21 NOTE — TOC Initial Note (Addendum)
Transition of Care [facility]) - Initial/Assessment Note    Patient Details  Name: Jessica Horn MRN: [id number] Date of Birth: December 30, 1980  [facility]) CM/SW Contact:    Marilu Favre, RN Phone Number: 01/21/2022, 10:35 AM  Clinical Narrative:                 Spoke to patient at bedside. S/P exp lap Graham patch repair VAC and JP . NPO, VAC dressing changes to start 01/22/22, plan UGI 01/23/22   Confirmed face sheet information. Patient from home with husband .   Patient may need home VAC. Olivia Mackie with [facility] will send e script to PA for signature.   Hoyle Sauer with [facility] can accept for [facility]. Will need to confirm start of care .  VAC approved , will need to call Olivia Mackie with [facility] when needed  Expected Discharge Plan: [place] Barriers to Discharge: Continued Medical Work up   Patient Goals and CMS Choice Patient states their goals for this hospitalization and ongoing recovery are:: to return to home CMS Medicare.gov Compare Post Acute Care list provided to:: Patient    Expected Discharge Plan and Services Expected Discharge Plan: [place]   Discharge Planning Services: CM Consult Post Acute Care Choice: [place] arrangements for the past 2 months: Single Family Home                 DME Arranged: Vac DME Agency: [employer] Date DME Agency Contacted: 01/21/22 Time DME Agency Contacted: 1034 Representative spoke with at DME Agency: Olivia Mackie HH Arranged: RN [place] Agency: Other - See comment ([facility]) Date [place]: 01/21/22 Time [place]: [id number] Representative spoke with at [place]: [place] Arrangements/Services Living arrangements for the past 2 months: [place] Lives with:: Spouse Patient language and need for interpreter reviewed:: Yes Do you feel safe going back to the place where you live?: Yes      Need for Family Participation in Patient Care: Yes  (Comment) Care giver support system in place?: Yes (comment)   Criminal Activity/Legal Involvement Pertinent to Current Situation/Hospitalization: No - Comment as needed  Activities of Daily Living      Permission Sought/Granted   Permission granted to share information with : No              Emotional Assessment Appearance:: Appears stated age Attitude/Demeanor/Rapport: Engaged Affect (typically observed): Accepting Orientation: : Oriented to Self, Oriented to Place, Oriented to  Time, Oriented to Situation Alcohol / Substance Use: Not Applicable Psych Involvement: No (comment)  Admission diagnosis:  Acute gastric ulcer with perforation ([place]) [K25.1] Gastric ulcer [K25.9] Patient Active Problem List   Diagnosis Date Noted   Gastric ulcer 01/20/2022   Cephalalgia 06/04/2015   Medication overuse headache 06/04/2015   Syncope and collapse 06/04/2015   Anxiety and depression 01/10/2014   Von Willebrand disease ([place]) 03/04/2013   Breast cancer ([place]) 11/05/2012   Gait disorder 08/09/2012   Thoracic spine pain 08/09/2012   Myofascial pain 08/09/2012   Tremor 08/09/2012   Cervical spine pain 08/09/2012   Insomnia 06/11/2012   Chronic pain of multiple sites 06/11/2012   Coordination abnormal 06/11/2012   Abnormal weight gain 06/11/2012   URI 10/10/2009   INSOMNIA 10/10/2009   GASTROENTERITIS 10/20/2008   OTHER MALAISE AND FATIGUE 07/03/2008   GERD 06/30/2008   ABDOMINAL PAIN 06/30/2008   DEPRESSION 06/26/2008   ALLERGIC RHINITIS 06/26/2008  VILLONODULAR SYNOVITIS, HAND 06/26/2008   WRIST PAIN, LEFT 06/26/2008   Personal history of malignant neoplasm of breast 06/26/2008   PCP:  London Pepper, MD Pharmacy:   [id number] [place], [place] [address]Phone: [phone]: [phone]    Social Determinants of Health (SDOH) Interventions    Readmission Risk Interventions     No data to display

## 2022-01-21 NOTE — Plan of Care (Signed)
  Problem: Education: Goal: Knowledge of General Education information will improve Description Including pain rating scale, medication(s)/side effects and non-pharmacologic comfort measures Outcome: Progressing   

## 2022-01-22 LAB — BASIC METABOLIC PANEL
Anion gap: 10 (ref 5–15)
BUN: 6 mg/dL (ref 6–20)
CO2: 24 mmol/L (ref 22–32)
Calcium: 7.9 mg/dL — ABNORMAL LOW (ref 8.9–10.3)
Chloride: 107 mmol/L (ref 98–111)
Creatinine, Ser: 0.62 mg/dL (ref 0.44–1.00)
GFR, Estimated: >60 mL/min (ref >60)
Glucose, Bld: 112 mg/dL — ABNORMAL HIGH (ref 70–99)
Potassium: 2.7 mmol/L — CL (ref 3.5–5.1)
Sodium: 141 mmol/L (ref 135–145)

## 2022-01-22 LAB — CBC
HCT: 25 % — ABNORMAL LOW (ref 36.0–46.0)
Hemoglobin: 8.5 g/dL — ABNORMAL LOW (ref 12.0–15.0)
MCH: 28.3 pg (ref 26.0–34.0)
MCHC: 34 g/dL (ref 30.0–36.0)
MCV: 83.3 fL (ref 80.0–100.0)
Platelets: 334 10*3/uL (ref 150–400)
RBC: 3 MIL/uL — ABNORMAL LOW (ref 3.87–5.11)
RDW: 13.2 % (ref 11.5–15.5)
WBC: 15.7 10*3/uL — ABNORMAL HIGH (ref 4.0–10.5)
nRBC: 0 % (ref 0.0–0.2)

## 2022-01-22 MED ORDER — POTASSIUM CHLORIDE 10 MEQ/100ML IV SOLN
10.0000 meq | INTRAVENOUS | Status: AC
  Administered 2022-01-22: 10 meq via INTRAVENOUS
  Filled 2022-01-22 (×2): qty 100

## 2022-01-22 MED ORDER — POTASSIUM CHLORIDE 20 MEQ PO PACK
40.0000 meq | PACK | Freq: Two times a day (BID) | ORAL | Status: AC
  Administered 2022-01-22 (×2): 40 meq via ORAL
  Filled 2022-01-22 (×2): qty 2

## 2022-01-22 MED ORDER — ACETAMINOPHEN 10 MG/ML IV SOLN: 1000.0000 mg | Freq: Four times a day (QID) | INTRAVENOUS | Status: DC

## 2022-01-22 MED ORDER — ACETAMINOPHEN 10 MG/ML IV SOLN
1000.0000 mg | Freq: Four times a day (QID) | INTRAVENOUS | Status: DC
  Administered 2022-01-22 – 2022-01-23 (×3): 1000 mg via INTRAVENOUS
  Filled 2022-01-22 (×4): qty 100

## 2022-01-22 NOTE — Progress Notes (Signed)
Lab called k level 2.7. Message sent to MD [facility]

## 2022-01-22 NOTE — Progress Notes (Signed)
2 Days Post-Op  Subjective: No nausea or vomiting.  +BM, flatus.  Ambulating some but having pain as expected with this.  Voiding frequently.  Still refusing IV potassium due to burning.   Objective: Vital signs in last 24 hours: Temp:  [98.9 F (37.2 C)-99.6 F (37.6 C)] 99.3 F (37.4 C) (09/13 0804) Pulse Rate:  [81-86] 83 (09/13 0804) Resp:  [16-17] 16 (09/13 0804) BP: (126-136)/(81-85) 136/85 (09/13 0804) SpO2:  [96 %-97 %] 97 % (09/13 0804) Last BM Date : 01/22/22  Intake/Output from previous day: 09/12 0701 - 09/13 0700 In: 2098.6 [I.V.:1403.9; IV Piggyback:694.7] Out: 2040 [Urine:2000; Drains:40] Intake/Output this shift: Total I/O In: -  Out: 30 [Drains:30]  PE: Gen: NAD Abd: soft, appropriately tender, midline VAC in place.  JP with serous drainage.     Lab Results:  Recent Labs    01/21/22 0044 01/22/22 0049  WBC 17.3* 15.7*  HGB 9.0* 8.5*  HCT 27.6* 25.0*  PLT 330 334   BMET Recent Labs    01/21/22 0044 01/21/22 1532 01/22/22 0049  NA 139  --  141  K 2.4* 3.3* 2.7*  CL 102  --  107  CO2 23  --  24  GLUCOSE 132*  --  112*  BUN 7  --  6  CREATININE 0.65  --  0.62  CALCIUM 8.5*  --  7.9*   PT/INR No results for input(s): "LABPROT", "INR" in the last 72 hours. CMP     Component Value Date/Time   NA 141 01/22/2022 0049   K 2.7 (LL) 01/22/2022 0049   CL 107 01/22/2022 0049   CO2 24 01/22/2022 0049   GLUCOSE 112 (H) 01/22/2022 0049   BUN 6 01/22/2022 0049   CREATININE 0.62 01/22/2022 0049   CREATININE 0.70 06/11/2012 1640   CALCIUM 7.9 (L) 01/22/2022 0049   PROT 6.4 (L) 01/20/2022 0115   ALBUMIN 3.7 01/20/2022 0115   AST 16 01/20/2022 0115   ALT 8 01/20/2022 0115   ALKPHOS 55 01/20/2022 0115   BILITOT 0.2 (L) 01/20/2022 0115   GFRNONAA >60 01/22/2022 0049   GFRAA >60 06/05/2018 1830   Lipase     Component Value Date/Time   LIPASE 37 01/20/2022 0115       Studies/Results: No results  found.  Anti-infectives: Anti-infectives (From admission, onward)    Start     Dose/Rate Route Frequency Ordered Stop   01/20/22 2200  piperacillin-tazobactam (ZOSYN) IVPB 3.375 g        3.375 g 12.5 mL/hr over 240 Minutes Intravenous Every 8 hours 01/20/22 1354 01/23/22 2359   01/20/22 1200  piperacillin-tazobactam (ZOSYN) IVPB 3.375 g  Status:  Discontinued        3.375 g 12.5 mL/hr over 240 Minutes Intravenous Every 6 hours 01/20/22 1037 01/20/22 1354   01/20/22 0600  fluconazole (DIFLUCAN) IVPB 400 mg        400 mg 100 mL/hr over 120 Minutes Intravenous Every 24 hours 01/20/22 0554 01/23/22 2359   01/20/22 0215  piperacillin-tazobactam (ZOSYN) IVPB 3.375 g        3.375 g 100 mL/hr over 30 Minutes Intravenous  Once 01/20/22 0213 01/20/22 0319        Assessment/Plan POD 2, s/p ex lap with graham patch repair of perforated gastric ulcer with JP drain and wound VAC placement, Dr. Bobbye Morton 9/11 -patient pulled NGT out.  Discussed importance.  Will replace if +N/V.  Strict NPO for now though, except oral liquid potassium.  Discussed risk  associated with leak at repair site, but patient refuses IV potassium despite all efforts to run it as slowly as possible, using heating packs etc.  We discussed that she has to have this replaced or it can lead to muscle/cardiac complications.  She understands all these risks. -UGI on Thursday 9/14 -voiding well, decrease fluids to 75cc/hr -ambulate -h. Pylori pending -wound VAC changes to start today, will see with dressing change. Ambulate -cont zosyn 9/11 --> -WBC down to 15K  FEN - NPO/IVFs, replace K, mag improved VTE - lovenox ID - zosyn  Hypokalemia/hypomagnesemia - see above. Anemia - likely postop from some blood loss during surgery. Von Willebrand's disease Jehovah's witness   LOS: 2 days    Henreitta Cea , [facility] Surgery 01/22/2022, 11:01 AM Please see Amion for pager number during day hours 7:00am-4:30pm or  7:00am -11:30am on weekends

## 2022-01-22 NOTE — Progress Notes (Signed)
Mobility Specialist - Progress Note   01/22/22 1041  Mobility  Activity Ambulated with assistance in hallway  Level of Assistance Contact guard assist, steadying assist  Assistive Device Other (Comment) (IV Pole)  Distance Ambulated (ft) 100 ft  Activity Response Tolerated well  $Mobility charge 1 Mobility    Pt received in bed agreeable to mobility. C/o 7/10 pain by surgical site. Left in bed w/ call bell in reach and all needs met.   Paulla Dolly Mobility Specialist

## 2022-01-22 NOTE — Progress Notes (Signed)
Pt unable to tolerate iv potassium. MD informed

## 2022-01-22 NOTE — Progress Notes (Signed)
Mobility Specialist - Progress Note   01/22/22 1043  Mobility  Activity Ambulated with assistance in room  Level of Assistance Contact guard assist, steadying assist  Assistive Device Other (Comment) (IV Pole)  Distance Ambulated (ft) 10 ft  Activity Response Tolerated well  $Mobility charge 1 Mobility    Pt requested to use BR. After use, left in bed w/ call bell in reach and all needs met.   Paulla Dolly Mobility Specialist

## 2022-01-23 ENCOUNTER — Inpatient Hospital Stay (HOSPITAL_COMMUNITY): Payer: BC Managed Care – PPO

## 2022-01-23 LAB — CBC
HCT: 27.2 % — ABNORMAL LOW (ref 36.0–46.0)
Hemoglobin: 8.6 g/dL — ABNORMAL LOW (ref 12.0–15.0)
MCH: 27.2 pg (ref 26.0–34.0)
MCHC: 31.6 g/dL (ref 30.0–36.0)
MCV: 86.1 fL (ref 80.0–100.0)
Platelets: 383 10*3/uL (ref 150–400)
RBC: 3.16 MIL/uL — ABNORMAL LOW (ref 3.87–5.11)
RDW: 13.2 % (ref 11.5–15.5)
WBC: 12.1 10*3/uL — ABNORMAL HIGH (ref 4.0–10.5)
nRBC: 0 % (ref 0.0–0.2)

## 2022-01-23 LAB — BASIC METABOLIC PANEL
Anion gap: 6 (ref 5–15)
BUN: <5 mg/dL — ABNORMAL LOW (ref 6–20)
CO2: 21 mmol/L — ABNORMAL LOW (ref 22–32)
Calcium: 8.2 mg/dL — ABNORMAL LOW (ref 8.9–10.3)
Chloride: 112 mmol/L — ABNORMAL HIGH (ref 98–111)
Creatinine, Ser: 0.49 mg/dL (ref 0.44–1.00)
GFR, Estimated: >60 mL/min (ref >60)
Glucose, Bld: 99 mg/dL (ref 70–99)
Potassium: 3.3 mmol/L — ABNORMAL LOW (ref 3.5–5.1)
Sodium: 139 mmol/L (ref 135–145)

## 2022-01-23 LAB — PHOSPHORUS: Phosphorus: 2.1 mg/dL — ABNORMAL LOW (ref 2.5–4.6)

## 2022-01-23 LAB — MAGNESIUM: Magnesium: 1.9 mg/dL (ref 1.7–2.4)

## 2022-01-23 MED ORDER — POTASSIUM CHLORIDE 20 MEQ PO PACK
40.0000 meq | PACK | Freq: Two times a day (BID) | ORAL | Status: AC
  Administered 2022-01-23: 40 meq via ORAL
  Filled 2022-01-23: qty 2

## 2022-01-23 MED ORDER — ACETAMINOPHEN 500 MG PO TABS
1000.0000 mg | ORAL_TABLET | Freq: Four times a day (QID) | ORAL | Status: DC
  Administered 2022-01-23 – 2022-01-24 (×4): 1000 mg via ORAL
  Filled 2022-01-23 (×4): qty 2

## 2022-01-23 MED ORDER — MORPHINE SULFATE (PF) 2 MG/ML IV SOLN
2.0000 mg | INTRAVENOUS | Status: DC | PRN
  Administered 2022-01-23 – 2022-01-24 (×7): 2 mg via INTRAVENOUS
  Filled 2022-01-23 (×8): qty 1

## 2022-01-23 MED ORDER — OXYCODONE HCL 5 MG PO TABS
5.0000 mg | ORAL_TABLET | ORAL | Status: DC | PRN
  Administered 2022-01-23 – 2022-01-24 (×6): 10 mg via ORAL
  Filled 2022-01-23 (×6): qty 2

## 2022-01-23 MED ORDER — METHOCARBAMOL 500 MG PO TABS
1000.0000 mg | ORAL_TABLET | Freq: Three times a day (TID) | ORAL | Status: DC
  Administered 2022-01-23 – 2022-01-24 (×3): 1000 mg via ORAL
  Filled 2022-01-23 (×3): qty 2

## 2022-01-23 MED ORDER — IOHEXOL 300 MG/ML  SOLN
100.0000 mL | Freq: Once | INTRAMUSCULAR | Status: AC | PRN
  Administered 2022-01-23: 100 mL via ORAL

## 2022-01-23 MED ORDER — ACETAMINOPHEN 10 MG/ML IV SOLN
1000.0000 mg | Freq: Once | INTRAVENOUS | Status: DC
  Filled 2022-01-23: qty 100

## 2022-01-23 NOTE — Progress Notes (Signed)
3 Days Post-Op  Subjective: Still with some pain and soreness as expected with mobilization.  + flatus and no nausea.  Voiding well still   Objective: Vital signs in last 24 hours: Temp:  [98.9 F (37.2 C)-99 F (37.2 C)] 99 F (37.2 C) (09/14 0635) Pulse Rate:  [78-84] 78 (09/14 0635) Resp:  [17] 17 (09/14 0635) BP: (123-137)/(82-87) 137/82 (09/14 0635) SpO2:  [100 %] 100 % (09/14 0635) Last BM Date : 01/22/22  Intake/Output from previous day: 09/13 0701 - 09/14 0700 In: 3161.5 [I.V.:2384.4; IV Piggyback:777.1] Out: 6295 [Urine:1700; Drains:60] Intake/Output this shift: No intake/output data recorded.  PE: Gen: NAD Abd: soft, appropriately tender, midline VAC in place.  JP with serous drainage.     Lab Results:  Recent Labs    01/22/22 0049 01/23/22 0030  WBC 15.7* 12.1*  HGB 8.5* 8.6*  HCT 25.0* 27.2*  PLT 334 383   BMET Recent Labs    01/22/22 0049 01/23/22 0030  NA 141 139  K 2.7* 3.3*  CL 107 112*  CO2 24 21*  GLUCOSE 112* 99  BUN 6 <5*  CREATININE 0.62 0.49  CALCIUM 7.9* 8.2*   PT/INR No results for input(s): "LABPROT", "INR" in the last 72 hours. CMP     Component Value Date/Time   NA 139 01/23/2022 0030   K 3.3 (L) 01/23/2022 0030   CL 112 (H) 01/23/2022 0030   CO2 21 (L) 01/23/2022 0030   GLUCOSE 99 01/23/2022 0030   BUN <5 (L) 01/23/2022 0030   CREATININE 0.49 01/23/2022 0030   CREATININE 0.70 06/11/2012 1640   CALCIUM 8.2 (L) 01/23/2022 0030   PROT 6.4 (L) 01/20/2022 0115   ALBUMIN 3.7 01/20/2022 0115   AST 16 01/20/2022 0115   ALT 8 01/20/2022 0115   ALKPHOS 55 01/20/2022 0115   BILITOT 0.2 (L) 01/20/2022 0115   GFRNONAA >60 01/23/2022 0030   GFRAA >60 06/05/2018 1830   Lipase     Component Value Date/Time   LIPASE 37 01/20/2022 0115       Studies/Results: No results found.  Anti-infectives: Anti-infectives (From admission, onward)    Start     Dose/Rate Route Frequency Ordered Stop   01/20/22 2200   piperacillin-tazobactam (ZOSYN) IVPB 3.375 g        3.375 g 12.5 mL/hr over 240 Minutes Intravenous Every 8 hours 01/20/22 1354 01/23/22 2359   01/20/22 1200  piperacillin-tazobactam (ZOSYN) IVPB 3.375 g  Status:  Discontinued        3.375 g 12.5 mL/hr over 240 Minutes Intravenous Every 6 hours 01/20/22 1037 01/20/22 1354   01/20/22 0600  fluconazole (DIFLUCAN) IVPB 400 mg        400 mg 100 mL/hr over 120 Minutes Intravenous Every 24 hours 01/20/22 0554 01/23/22 0859   01/20/22 0215  piperacillin-tazobactam (ZOSYN) IVPB 3.375 g        3.375 g 100 mL/hr over 30 Minutes Intravenous  Once 01/20/22 0213 01/20/22 0319        Assessment/Plan POD 3, s/p ex lap with graham patch repair of perforated gastric ulcer with JP drain and wound VAC placement, Dr. Bobbye Morton 9/11 -awaiting UGI results to determine if can have FLD -voiding well, fluids to 75cc/hr, and will decrease if put on a diet -ambulate -h. Pylori negative -wound VAC while here and plan to remove at discharge. -Ambulate -cont zosyn 9/11 --> -WBC down to 12K  FEN - NPO/IVFs, but adv to FLD if UGI negative, replace K, mag improved  VTE - lovenox ID - zosyn  Hypokalemia/hypomagnesemia - see above. Anemia - likely postop from some blood loss during surgery. Von Willebrand's disease Jehovah's witness   LOS: 3 days    Jessica Horn , [facility] 01/23/2022, 9:57 AM Please see Amion for pager number during day hours 7:00am-4:30pm or 7:00am -11:30am on weekends

## 2022-01-23 NOTE — Progress Notes (Signed)
Mobility Specialist - Progress Note   01/23/22 1523  Mobility  Activity Ambulated with assistance in hallway  Level of Assistance Standby assist, set-up cues, supervision of patient - no hands on  Assistive Device Other (Comment) (IV Pole)  Distance Ambulated (ft) 380 ft  Activity Response Tolerated well  $Mobility charge 1 Mobility    Pt received in bed agreeable to mobility. No physical assistance needed throughout, no complaints. Used BR then left in bed w/ call bell in reach and all needs met.   Paulla Dolly Mobility Specialist

## 2022-01-24 ENCOUNTER — Other Ambulatory Visit (HOSPITAL_COMMUNITY): Payer: Self-pay

## 2022-01-24 ENCOUNTER — Inpatient Hospital Stay (HOSPITAL_COMMUNITY): Payer: BC Managed Care – PPO

## 2022-01-24 LAB — CBC
HCT: 33.4 % — ABNORMAL LOW (ref 36.0–46.0)
Hemoglobin: 10.6 g/dL — ABNORMAL LOW (ref 12.0–15.0)
MCH: 27.5 pg (ref 26.0–34.0)
MCHC: 31.7 g/dL (ref 30.0–36.0)
MCV: 86.8 fL (ref 80.0–100.0)
Platelets: 606 10*3/uL — ABNORMAL HIGH (ref 150–400)
RBC: 3.85 MIL/uL — ABNORMAL LOW (ref 3.87–5.11)
RDW: 13.4 % (ref 11.5–15.5)
WBC: 12 10*3/uL — ABNORMAL HIGH (ref 4.0–10.5)
nRBC: 0 % (ref 0.0–0.2)

## 2022-01-24 LAB — BASIC METABOLIC PANEL
Anion gap: 13 (ref 5–15)
BUN: <5 mg/dL — ABNORMAL LOW (ref 6–20)
CO2: 21 mmol/L — ABNORMAL LOW (ref 22–32)
Calcium: 9.4 mg/dL (ref 8.9–10.3)
Chloride: 106 mmol/L (ref 98–111)
Creatinine, Ser: 0.6 mg/dL (ref 0.44–1.00)
GFR, Estimated: >60 mL/min (ref >60)
Glucose, Bld: 107 mg/dL — ABNORMAL HIGH (ref 70–99)
Potassium: 3.3 mmol/L — ABNORMAL LOW (ref 3.5–5.1)
Sodium: 140 mmol/L (ref 135–145)

## 2022-01-24 MED ORDER — METHOCARBAMOL 500 MG PO TABS
500.0000 mg | ORAL_TABLET | Freq: Four times a day (QID) | ORAL | 0 refills | Status: AC | PRN
  Filled 2022-01-24: qty 60, 8d supply, fill #0

## 2022-01-24 MED ORDER — POTASSIUM CHLORIDE 20 MEQ PO PACK
40.0000 meq | PACK | Freq: Once | ORAL | Status: AC
  Administered 2022-01-24: 40 meq via ORAL
  Filled 2022-01-24: qty 2

## 2022-01-24 MED ORDER — ACETAMINOPHEN 500 MG PO TABS: 1000.0000 mg | ORAL_TABLET | Freq: Four times a day (QID) | ORAL | 0 refills | Status: AC | PRN

## 2022-01-24 MED ORDER — OXYCODONE HCL 10 MG PO TABS
5.0000 mg | ORAL_TABLET | ORAL | 0 refills | Status: AC | PRN
  Filled 2022-01-24: qty 20, 4d supply, fill #0

## 2022-01-24 NOTE — Progress Notes (Signed)
   01/24/22 0035  Pain Assessment  Pain Scale 0-10  Pain Score 9  Pain Location Chest  Pain Orientation Right;Left  Pain Descriptors / Indicators Sharp;Stabbing  Pain Frequency Other (Comment)  Pain Onset Sudden  Patients Stated Pain Goal 0  Pain Intervention(s) Medication (See eMAR);Music   Patient requested to speak with provider,MD notified about patient's pain and request, DG chest ordered,will continue to monitor.

## 2022-01-24 NOTE — Progress Notes (Signed)
Mobility Specialist - Progress Note   01/24/22 1441  Mobility  Activity Ambulated independently in hallway  Level of Assistance Independent  Assistive Device None  Distance Ambulated (ft) 380 ft  Activity Response Tolerated well  $Mobility charge 1 Mobility    Pt received in hallway ambulating independently. No physical assistance required. Left in bed w/ call bell in reach and all needs met.   Paulla Dolly Mobility Specialist

## 2022-01-24 NOTE — TOC CM/SW Note (Signed)
Discussed in progression with Claiborne Billings PA and bedside nurse.   Wound VAC discontinued. Husband has been taught wound care. Per Claiborne Billings no need for [facility]. Bedside nurse has given patient dressing supplies.   Olivia Mackie with 36M aware and Hoyle Sauer with [facility] aware

## 2022-01-24 NOTE — Discharge Summary (Signed)
Patient ID: Jessica Horn [id number] 1981/01/21 41 y.o.  Admit date: 01/20/2022 Discharge date: 01/24/2022  Admitting Diagnosis: Perforated gastric ulcer Chronic pain syndrome Remote h/o breast cancer at age 43 Von Willebrand's disease Jehovah's Witness  Discharge Diagnosis Patient Active Problem List   Diagnosis Date Noted   Gastric ulcer 01/20/2022   Cephalalgia 06/04/2015   Medication overuse headache 06/04/2015   Syncope and collapse 06/04/2015   Anxiety and depression 01/10/2014   Von Willebrand disease ([place]) 03/04/2013   Breast cancer ([place]) 11/05/2012   Gait disorder 08/09/2012   Thoracic spine pain 08/09/2012   Myofascial pain 08/09/2012   Tremor 08/09/2012   Cervical spine pain 08/09/2012   Insomnia 06/11/2012   Chronic pain of multiple sites 06/11/2012   Coordination abnormal 06/11/2012   Abnormal weight gain 06/11/2012   URI 10/10/2009   INSOMNIA 10/10/2009   GASTROENTERITIS 10/20/2008   OTHER MALAISE AND FATIGUE 07/03/2008   GERD 06/30/2008   ABDOMINAL PAIN 06/30/2008   DEPRESSION 06/26/2008   ALLERGIC RHINITIS 06/26/2008   VILLONODULAR SYNOVITIS, HAND 06/26/2008   WRIST PAIN, LEFT 06/26/2008   Personal history of malignant neoplasm of breast 06/26/2008    Consultants none  Reason for Admission: 52F with acute onset abdominal pain since 2000 last PM, refractory to pepto bismol. No prior h/o GERD. Significant NSAID use. CT finding c/w perforated gastric ulcer. Reports lap hyst 09/2021 and h/o vWD.  Procedures Dr. Bobbye Morton, 01/20/22 Exploratory laparotomy, modified Phillip Heal patch repair, abdominal washout, JP drain placement, incisional wound vac application [id number]   Hospital Course:  The patient was admitted and placed on IV zosyn and taken to the OR for the above procedure.  She had an NGT placed postoperatively, but she removed this herself on POD 0.  She remained strictly NPO and began having bowel function.  She underwent an UGI on POD 3  which revealed no leak.  Her diet was then able to be advanced as tolerated.  Her JP drain remained serous and this was removed prior to discharge.  She had a wound VAC placed at the time of surgery.  She struggled with pain during VAC changed and was transitioned to NS WD dressing changes prior to discharge.  Her husband was taught how to do these.  She also had profound hypokalemia that took a while to correct as the patient was unable to tolerate IV runs.  It was replaced in her fluid, but was not sufficient.  She was finally able to take oral K and this allowed for improvement.  On POD 4, the patient was felt to be medically and surgically stable for DC home.  She completed 4 days of post op abx therapy with a WBC of 11K and AF.  She was fel to need no further abx therapy at time of discharge.  Physical Exam: Abd: soft, appropriately tender, midline incision is clean with 100% granulation tissue, JP with just serous fluid, ND  Allergies as of 01/24/2022   No Known Allergies      Medication List     STOP taking these medications    cyclobenzaprine 10 MG tablet Commonly known as: FLEXERIL   HYDROcodone-acetaminophen 10-325 MG tablet Commonly known as: NORCO       TAKE these medications    acetaminophen 500 MG tablet Commonly known as: TYLENOL Take 2 tablets (1,000 mg total) by mouth every 6 (six) hours as needed.   estradiol 0.025 MG/24HR Commonly known as: VIVELLE-DOT Place 1 patch onto the skin 2 (two) times  a week.   gabapentin 100 MG capsule Commonly known as: NEURONTIN Take 100 mg by mouth at bedtime.   hydrOXYzine 25 MG tablet Commonly known as: ATARAX Take 25 mg by mouth 3 (three) times daily as needed for anxiety.   MELATONIN GUMMIES PO Take 1 tablet by mouth at bedtime as needed (sleep).   methocarbamol 500 MG tablet Commonly known as: ROBAXIN Take 1-2 tablets (500-1,000 mg total) by mouth every 6 (six) hours as needed for muscle spasms.   Oxycodone HCl 10  MG Tabs Take 0.5-1 tablets (5-10 mg total) by mouth every 4 (four) hours as needed for moderate pain.          Follow-up Information     Jesusita Oka, MD Follow up on 02/04/2022.   Specialty: Surgery Why: 4:30pm,, Arrive 30 minutes prior to your appointment time, Please bring your insurance card and photo ID Contact information: [place] [id number] [phone]                 Signed: Saverio Danker, [facility] Surgery 01/24/2022, 10:35 AM Please see Amion for pager number during day hours 7:00am-4:30pm, 7-11:30am on Weekends

## 2022-01-24 NOTE — Discharge Instructions (Addendum)
Do NOT take any anti-inflammatories such as ibuprofen, aleve, naproxen, goody's powder, etc  [facility]  Please arrive at least 30 min before your appointment to complete your check in paperwork.  If you are unable to arrive 30 min prior to your appointment time we may have to cancel or reschedule you. LAPAROSCOPIC SURGERY: POST OP INSTRUCTIONS Always review your discharge instruction sheet given to you by the facility where your surgery was performed. IF YOU HAVE DISABILITY OR FAMILY LEAVE FORMS, YOU MUST BRING THEM TO THE OFFICE FOR PROCESSING.   DO NOT GIVE THEM TO YOUR DOCTOR.  PAIN CONTROL  First take acetaminophen (Tylenol) AND/or ibuprofen (Advil) to control your pain after surgery.  Follow directions on package.  Taking acetaminophen (Tylenol) and/or ibuprofen (Advil) regularly after surgery will help to control your pain and lower the amount of prescription pain medication you may need.  You should not take more than 4,000 mg (4 grams) of acetaminophen (Tylenol) in 24 hours.  You should not take ibuprofen (Advil), aleve, motrin, naprosyn or other NSAIDS if you have a history of stomach ulcers or chronic kidney disease.  A prescription for pain medication may be given to you upon discharge.  Take your pain medication as prescribed, if you still have uncontrolled pain after taking acetaminophen (Tylenol) or ibuprofen (Advil). Use ice packs to help control pain. If you need a refill on your pain medication, please contact your pharmacy.  They will contact our office to request authorization. Prescriptions will not be filled after 5pm or on week-ends.  HOME MEDICATIONS Take your usually prescribed medications unless otherwise directed.  DIET You should follow a light diet the first few days after arrival home.  Be sure to include lots of fluids daily. Avoid fatty, fried foods.  Low fiber diet for the first 1-2 weeks and then you can switch to your normal  diet.  CONSTIPATION It is common to experience some constipation after surgery and if you are taking pain medication.  Increasing fluid intake and taking a stool softener (such as Colace) will usually help or prevent this problem from occurring.  A mild laxative (Milk of Magnesia or Miralax) should be taken according to package instructions if there are no bowel movements after 48 hours.  WOUND/INCISION CARE Most patients will experience some swelling and bruising in the area of the incisions.  Ice packs will help.  Swelling and bruising can take several days to resolve.  Unless discharge instructions indicate otherwise, follow guidelines below  STERI-STRIPS - you may remove your outer bandages 48 hours after surgery, and you may shower at that time.  You have steri-strips (small skin tapes) in place directly over the incision.  These strips should be left on the skin for 7-10 days.   DERMABOND/SKIN GLUE - you may shower in 24 hours.  The glue will flake off over the next 2-3 weeks. Any sutures or staples will be removed at the office during your follow-up visit.  ACTIVITIES You may resume regular (light) daily activities beginning the next day--such as daily self-care, walking, climbing stairs--gradually increasing activities as tolerated.  You may have sexual intercourse when it is comfortable.  Refrain from any heavy lifting or straining until approved by your doctor. You may drive when you are no longer taking prescription pain medication, you can comfortably wear a seatbelt, and you can safely maneuver your car and apply brakes.  FOLLOW-UP You should see your doctor in the office for a follow-up appointment  approximately 2-3 weeks after your surgery.  You should have been given your post-op/follow-up appointment when your surgery was scheduled.  If you did not receive a post-op/follow-up appointment, make sure that you call for this appointment within a day or two after you arrive home to  insure a convenient appointment time.   WHEN TO CALL YOUR DOCTOR: Fever over 101.0 Inability to urinate Continued bleeding from incision. Increased pain, redness, or drainage from the incision. Increasing abdominal pain  The clinic staff is available to answer your questions during regular business hours.  Please don't hesitate to call and ask to speak to one of the nurses for clinical concerns.  If you have a medical emergency, go to the nearest emergency room or call 911.  A surgeon from [facility] is always on call at the hospital. [address], [place], [place], [place]  [address] ? [address], [place], [place]   [address] [phone] ? [phone] ? FAX [phone]  WOUND CARE: - midline dressing to be changed daily - supplies: sterile saline, gauze, scissors, tape  - remove dressing and all packing carefully, moistening with sterile saline as needed to avoid packing/internal dressing sticking to the wound. - clean edges of skin around the wound with water/gauze, making sure there is no tape debris or leakage left on skin that could cause skin irritation or breakdown. - dampen and clean gauze with sterile saline and pack wound from wound base to skin level, making sure to take note of any possible areas of wound tracking, tunneling and packing appropriately. Wound can be packed loosely. Trim gauze to size if a whole gauze is not required. - cover wound with a dry gauze and secure with tape.  - write the date/time on the dry dressing/tape to better track when the last dressing change occurred. - change dressing as needed if leakage occurs, wound gets contaminated, or patient requests to shower. - patient may shower daily with wound open (i.e. remove all packing) and following the shower the wound should be dried and a clean dressing placed.

## 2022-01-25 LAB — CULTURE, BLOOD (SINGLE)
Culture: NO GROWTH
Special Requests: ADEQUATE

## 2022-01-27 DIAGNOSIS — M546 Pain in thoracic spine: Secondary | ICD-10-CM | POA: Diagnosis not present

## 2022-02-09 ENCOUNTER — Encounter (HOSPITAL_BASED_OUTPATIENT_CLINIC_OR_DEPARTMENT_OTHER): Payer: Self-pay

## 2022-02-09 ENCOUNTER — Other Ambulatory Visit: Payer: Self-pay

## 2022-02-09 ENCOUNTER — Emergency Department (HOSPITAL_BASED_OUTPATIENT_CLINIC_OR_DEPARTMENT_OTHER)
Admission: EM | Admit: 2022-02-09 | Discharge: 2022-02-09 | Disposition: A | Discharge status: Home / Self Care | Payer: BC Managed Care – PPO | Attending: Emergency Medicine | Admitting: Emergency Medicine

## 2022-02-09 DIAGNOSIS — R110 Nausea: Secondary | ICD-10-CM | POA: Diagnosis not present

## 2022-02-09 DIAGNOSIS — Z5321 Procedure and treatment not carried out due to patient leaving prior to being seen by health care provider: Secondary | ICD-10-CM | POA: Diagnosis not present

## 2022-02-09 LAB — COMPREHENSIVE METABOLIC PANEL
ALT: <5 U/L (ref 0–44)
AST: 17 U/L (ref 15–41)
Albumin: 4.3 g/dL (ref 3.5–5.0)
Alkaline Phosphatase: 65 U/L (ref 38–126)
Anion gap: 12 (ref 5–15)
BUN: 7 mg/dL (ref 6–20)
CO2: 20 mmol/L — ABNORMAL LOW (ref 22–32)
Calcium: 10 mg/dL (ref 8.9–10.3)
Chloride: 107 mmol/L (ref 98–111)
Creatinine, Ser: 0.54 mg/dL (ref 0.44–1.00)
GFR, Estimated: >60 mL/min (ref >60)
Glucose, Bld: 99 mg/dL (ref 70–99)
Potassium: 3.7 mmol/L (ref 3.5–5.1)
Sodium: 139 mmol/L (ref 135–145)
Total Bilirubin: 0.3 mg/dL (ref 0.3–1.2)
Total Protein: 7.8 g/dL (ref 6.5–8.1)

## 2022-02-09 LAB — CBC
HCT: 33.3 % — ABNORMAL LOW (ref 36.0–46.0)
Hemoglobin: 10.5 g/dL — ABNORMAL LOW (ref 12.0–15.0)
MCH: 27.1 pg (ref 26.0–34.0)
MCHC: 31.5 g/dL (ref 30.0–36.0)
MCV: 85.8 fL (ref 80.0–100.0)
Platelets: 579 10*3/uL — ABNORMAL HIGH (ref 150–400)
RBC: 3.88 MIL/uL (ref 3.87–5.11)
RDW: 13.8 % (ref 11.5–15.5)
WBC: 4.3 10*3/uL (ref 4.0–10.5)
nRBC: 0 % (ref 0.0–0.2)

## 2022-02-09 LAB — LIPASE, BLOOD: Lipase: <10 U/L — ABNORMAL LOW (ref 11–51)

## 2022-02-09 LAB — HCG, SERUM, QUALITATIVE: Preg, Serum: NEGATIVE

## 2022-02-09 NOTE — ED Triage Notes (Signed)
Patient here POV from Home.  Endorses recent Surgical Procedure where she had a Gastric Ulcer that was perforated. JP Drain was placed after Procedure. Discharged a few weeks ago.   Yesterday the patient sneezed and stood up quickly and believes she may have complicated something. Some Associated Nausea. No Other Symptoms.   NAD Noted during Triage. A&Ox4. GCS 15. BIB Wheelchair.

## 2022-02-11 DIAGNOSIS — M546 Pain in thoracic spine: Secondary | ICD-10-CM | POA: Diagnosis not present

## 2022-03-05 DIAGNOSIS — M546 Pain in thoracic spine: Secondary | ICD-10-CM | POA: Diagnosis not present

## 2022-03-31 DIAGNOSIS — Z79891 Long term (current) use of opiate analgesic: Secondary | ICD-10-CM | POA: Diagnosis not present

## 2022-03-31 DIAGNOSIS — M546 Pain in thoracic spine: Secondary | ICD-10-CM | POA: Diagnosis not present

## 2022-04-29 DIAGNOSIS — M546 Pain in thoracic spine: Secondary | ICD-10-CM | POA: Diagnosis not present

## 2022-05-27 DIAGNOSIS — M546 Pain in thoracic spine: Secondary | ICD-10-CM | POA: Diagnosis not present

## 2022-06-14 DIAGNOSIS — M791 Myalgia, unspecified site: Secondary | ICD-10-CM | POA: Diagnosis not present

## 2022-06-14 DIAGNOSIS — R5383 Other fatigue: Secondary | ICD-10-CM | POA: Diagnosis not present

## 2022-06-14 DIAGNOSIS — R509 Fever, unspecified: Secondary | ICD-10-CM | POA: Diagnosis not present

## 2022-06-26 DIAGNOSIS — M546 Pain in thoracic spine: Secondary | ICD-10-CM | POA: Diagnosis not present

## 2022-07-09 DIAGNOSIS — M2569 Stiffness of other specified joint, not elsewhere classified: Secondary | ICD-10-CM | POA: Diagnosis not present

## 2022-07-09 DIAGNOSIS — R293 Abnormal posture: Secondary | ICD-10-CM | POA: Diagnosis not present

## 2022-07-09 DIAGNOSIS — M546 Pain in thoracic spine: Secondary | ICD-10-CM | POA: Diagnosis not present

## 2022-07-10 DIAGNOSIS — L940 Localized scleroderma [morphea]: Secondary | ICD-10-CM | POA: Diagnosis not present

## 2022-07-16 NOTE — Telephone Encounter (Signed)
 Called patient back but was unable to reach. Voicemail is full. Message sent through chart

## 2022-07-18 DIAGNOSIS — M2569 Stiffness of other specified joint, not elsewhere classified: Secondary | ICD-10-CM | POA: Diagnosis not present

## 2022-07-18 DIAGNOSIS — M546 Pain in thoracic spine: Secondary | ICD-10-CM | POA: Diagnosis not present

## 2022-07-18 DIAGNOSIS — R293 Abnormal posture: Secondary | ICD-10-CM | POA: Diagnosis not present

## 2022-07-24 DIAGNOSIS — M546 Pain in thoracic spine: Secondary | ICD-10-CM | POA: Diagnosis not present

## 2022-07-24 DIAGNOSIS — M545 Low back pain, unspecified: Secondary | ICD-10-CM | POA: Diagnosis not present

## 2022-07-30 DIAGNOSIS — M546 Pain in thoracic spine: Secondary | ICD-10-CM | POA: Diagnosis not present

## 2022-07-30 DIAGNOSIS — M2569 Stiffness of other specified joint, not elsewhere classified: Secondary | ICD-10-CM | POA: Diagnosis not present

## 2022-07-30 DIAGNOSIS — R293 Abnormal posture: Secondary | ICD-10-CM | POA: Diagnosis not present

## 2022-08-01 DIAGNOSIS — M2569 Stiffness of other specified joint, not elsewhere classified: Secondary | ICD-10-CM | POA: Diagnosis not present

## 2022-08-01 DIAGNOSIS — R293 Abnormal posture: Secondary | ICD-10-CM | POA: Diagnosis not present

## 2022-08-01 DIAGNOSIS — M546 Pain in thoracic spine: Secondary | ICD-10-CM | POA: Diagnosis not present

## 2022-08-06 DIAGNOSIS — R293 Abnormal posture: Secondary | ICD-10-CM | POA: Diagnosis not present

## 2022-08-06 DIAGNOSIS — M2569 Stiffness of other specified joint, not elsewhere classified: Secondary | ICD-10-CM | POA: Diagnosis not present

## 2022-08-06 DIAGNOSIS — M546 Pain in thoracic spine: Secondary | ICD-10-CM | POA: Diagnosis not present

## 2022-08-08 DIAGNOSIS — R293 Abnormal posture: Secondary | ICD-10-CM | POA: Diagnosis not present

## 2022-08-08 DIAGNOSIS — M546 Pain in thoracic spine: Secondary | ICD-10-CM | POA: Diagnosis not present

## 2022-08-08 DIAGNOSIS — M2569 Stiffness of other specified joint, not elsewhere classified: Secondary | ICD-10-CM | POA: Diagnosis not present

## 2022-08-21 DIAGNOSIS — R293 Abnormal posture: Secondary | ICD-10-CM | POA: Diagnosis not present

## 2022-08-21 DIAGNOSIS — M546 Pain in thoracic spine: Secondary | ICD-10-CM | POA: Diagnosis not present

## 2022-08-21 DIAGNOSIS — M2569 Stiffness of other specified joint, not elsewhere classified: Secondary | ICD-10-CM | POA: Diagnosis not present

## 2022-08-22 DIAGNOSIS — M546 Pain in thoracic spine: Secondary | ICD-10-CM | POA: Diagnosis not present

## 2022-08-27 DIAGNOSIS — M2569 Stiffness of other specified joint, not elsewhere classified: Secondary | ICD-10-CM | POA: Diagnosis not present

## 2022-08-27 DIAGNOSIS — R293 Abnormal posture: Secondary | ICD-10-CM | POA: Diagnosis not present

## 2022-08-27 DIAGNOSIS — M546 Pain in thoracic spine: Secondary | ICD-10-CM | POA: Diagnosis not present

## 2022-08-29 DIAGNOSIS — R293 Abnormal posture: Secondary | ICD-10-CM | POA: Diagnosis not present

## 2022-08-29 DIAGNOSIS — M546 Pain in thoracic spine: Secondary | ICD-10-CM | POA: Diagnosis not present

## 2022-08-29 DIAGNOSIS — M2569 Stiffness of other specified joint, not elsewhere classified: Secondary | ICD-10-CM | POA: Diagnosis not present

## 2022-09-05 DIAGNOSIS — R293 Abnormal posture: Secondary | ICD-10-CM | POA: Diagnosis not present

## 2022-09-05 DIAGNOSIS — M546 Pain in thoracic spine: Secondary | ICD-10-CM | POA: Diagnosis not present

## 2022-09-05 DIAGNOSIS — M2569 Stiffness of other specified joint, not elsewhere classified: Secondary | ICD-10-CM | POA: Diagnosis not present

## 2022-09-10 DIAGNOSIS — R293 Abnormal posture: Secondary | ICD-10-CM | POA: Diagnosis not present

## 2022-09-10 DIAGNOSIS — M546 Pain in thoracic spine: Secondary | ICD-10-CM | POA: Diagnosis not present

## 2022-09-10 DIAGNOSIS — M2569 Stiffness of other specified joint, not elsewhere classified: Secondary | ICD-10-CM | POA: Diagnosis not present

## 2022-09-19 DIAGNOSIS — Z79891 Long term (current) use of opiate analgesic: Secondary | ICD-10-CM | POA: Diagnosis not present

## 2022-09-19 DIAGNOSIS — M546 Pain in thoracic spine: Secondary | ICD-10-CM | POA: Diagnosis not present

## 2022-09-22 ENCOUNTER — Other Ambulatory Visit: Payer: Self-pay | Admitting: Physician Assistant

## 2022-09-22 DIAGNOSIS — M546 Pain in thoracic spine: Secondary | ICD-10-CM

## 2022-09-23 DIAGNOSIS — M549 Dorsalgia, unspecified: Secondary | ICD-10-CM | POA: Diagnosis not present

## 2022-09-23 DIAGNOSIS — Z853 Personal history of malignant neoplasm of breast: Secondary | ICD-10-CM | POA: Diagnosis not present

## 2022-09-23 DIAGNOSIS — F418 Other specified anxiety disorders: Secondary | ICD-10-CM | POA: Diagnosis not present

## 2022-09-24 DIAGNOSIS — R293 Abnormal posture: Secondary | ICD-10-CM | POA: Diagnosis not present

## 2022-09-24 DIAGNOSIS — M2569 Stiffness of other specified joint, not elsewhere classified: Secondary | ICD-10-CM | POA: Diagnosis not present

## 2022-09-24 DIAGNOSIS — M546 Pain in thoracic spine: Secondary | ICD-10-CM | POA: Diagnosis not present

## 2022-10-08 DIAGNOSIS — M2569 Stiffness of other specified joint, not elsewhere classified: Secondary | ICD-10-CM | POA: Diagnosis not present

## 2022-10-08 DIAGNOSIS — M546 Pain in thoracic spine: Secondary | ICD-10-CM | POA: Diagnosis not present

## 2022-10-08 DIAGNOSIS — R293 Abnormal posture: Secondary | ICD-10-CM | POA: Diagnosis not present

## 2022-10-15 DIAGNOSIS — R293 Abnormal posture: Secondary | ICD-10-CM | POA: Diagnosis not present

## 2022-10-15 DIAGNOSIS — M546 Pain in thoracic spine: Secondary | ICD-10-CM | POA: Diagnosis not present

## 2022-10-15 DIAGNOSIS — M2569 Stiffness of other specified joint, not elsewhere classified: Secondary | ICD-10-CM | POA: Diagnosis not present

## 2022-10-17 DIAGNOSIS — Z79891 Long term (current) use of opiate analgesic: Secondary | ICD-10-CM | POA: Diagnosis not present

## 2022-10-17 DIAGNOSIS — M2569 Stiffness of other specified joint, not elsewhere classified: Secondary | ICD-10-CM | POA: Diagnosis not present

## 2022-10-17 DIAGNOSIS — M546 Pain in thoracic spine: Secondary | ICD-10-CM | POA: Diagnosis not present

## 2022-10-17 DIAGNOSIS — R293 Abnormal posture: Secondary | ICD-10-CM | POA: Diagnosis not present

## 2022-10-20 DIAGNOSIS — F411 Generalized anxiety disorder: Secondary | ICD-10-CM | POA: Diagnosis not present

## 2022-10-20 DIAGNOSIS — F329 Major depressive disorder, single episode, unspecified: Secondary | ICD-10-CM | POA: Diagnosis not present

## 2022-11-17 DIAGNOSIS — M546 Pain in thoracic spine: Secondary | ICD-10-CM | POA: Diagnosis not present

## 2022-11-20 ENCOUNTER — Other Ambulatory Visit: Payer: BC Managed Care – PPO

## 2022-12-16 DIAGNOSIS — M546 Pain in thoracic spine: Secondary | ICD-10-CM | POA: Diagnosis not present

## 2022-12-29 ENCOUNTER — Other Ambulatory Visit: Payer: Self-pay | Admitting: Physician Assistant

## 2022-12-29 DIAGNOSIS — M546 Pain in thoracic spine: Secondary | ICD-10-CM

## 2023-01-19 DIAGNOSIS — Z79891 Long term (current) use of opiate analgesic: Secondary | ICD-10-CM | POA: Diagnosis not present

## 2023-01-19 DIAGNOSIS — M546 Pain in thoracic spine: Secondary | ICD-10-CM | POA: Diagnosis not present

## 2023-02-16 DIAGNOSIS — M546 Pain in thoracic spine: Secondary | ICD-10-CM | POA: Diagnosis not present

## 2023-03-20 DIAGNOSIS — M546 Pain in thoracic spine: Secondary | ICD-10-CM | POA: Diagnosis not present

## 2023-04-20 DIAGNOSIS — M546 Pain in thoracic spine: Secondary | ICD-10-CM | POA: Diagnosis not present

## 2023-05-19 DIAGNOSIS — M546 Pain in thoracic spine: Secondary | ICD-10-CM | POA: Diagnosis not present

## 2023-05-19 DIAGNOSIS — Z79891 Long term (current) use of opiate analgesic: Secondary | ICD-10-CM | POA: Diagnosis not present

## 2023-06-11 DIAGNOSIS — R112 Nausea with vomiting, unspecified: Secondary | ICD-10-CM | POA: Diagnosis not present

## 2023-06-17 DIAGNOSIS — M546 Pain in thoracic spine: Secondary | ICD-10-CM | POA: Diagnosis not present

## 2023-07-15 DIAGNOSIS — M546 Pain in thoracic spine: Secondary | ICD-10-CM | POA: Diagnosis not present

## 2023-08-14 DIAGNOSIS — M546 Pain in thoracic spine: Secondary | ICD-10-CM | POA: Diagnosis not present

## 2023-09-16 DIAGNOSIS — M546 Pain in thoracic spine: Secondary | ICD-10-CM | POA: Diagnosis not present

## 2023-09-16 DIAGNOSIS — Z79891 Long term (current) use of opiate analgesic: Secondary | ICD-10-CM | POA: Diagnosis not present

## 2023-10-21 DIAGNOSIS — M546 Pain in thoracic spine: Secondary | ICD-10-CM | POA: Diagnosis not present

## 2023-11-18 DIAGNOSIS — M546 Pain in thoracic spine: Secondary | ICD-10-CM | POA: Diagnosis not present

## 2023-12-17 DIAGNOSIS — M546 Pain in thoracic spine: Secondary | ICD-10-CM | POA: Diagnosis not present

## 2024-01-05 ENCOUNTER — Emergency Department (HOSPITAL_COMMUNITY)
Admission: EM | Admit: 2024-01-05 | Discharge: 2024-01-06 | Discharge status: Against Medical Advice | Attending: Emergency Medicine | Admitting: Emergency Medicine

## 2024-01-05 ENCOUNTER — Other Ambulatory Visit: Payer: Self-pay

## 2024-01-05 ENCOUNTER — Encounter (HOSPITAL_COMMUNITY): Payer: Self-pay | Admitting: Emergency Medicine

## 2024-01-05 ENCOUNTER — Emergency Department (HOSPITAL_COMMUNITY)

## 2024-01-05 DIAGNOSIS — Z5321 Procedure and treatment not carried out due to patient leaving prior to being seen by health care provider: Secondary | ICD-10-CM | POA: Diagnosis not present

## 2024-01-05 DIAGNOSIS — Z90710 Acquired absence of both cervix and uterus: Secondary | ICD-10-CM | POA: Diagnosis not present

## 2024-01-05 DIAGNOSIS — N3289 Other specified disorders of bladder: Secondary | ICD-10-CM | POA: Diagnosis not present

## 2024-01-05 DIAGNOSIS — R109 Unspecified abdominal pain: Secondary | ICD-10-CM | POA: Diagnosis not present

## 2024-01-05 DIAGNOSIS — R1110 Vomiting, unspecified: Secondary | ICD-10-CM | POA: Diagnosis not present

## 2024-01-05 DIAGNOSIS — K5900 Constipation, unspecified: Secondary | ICD-10-CM | POA: Diagnosis not present

## 2024-01-05 LAB — CBC
HCT: 37.6 % (ref 36.0–46.0)
Hemoglobin: 12 g/dL (ref 12.0–15.0)
MCH: 28.4 pg (ref 26.0–34.0)
MCHC: 31.9 g/dL (ref 30.0–36.0)
MCV: 89.1 fL (ref 80.0–100.0)
Platelets: 326 K/uL (ref 150–400)
RBC: 4.22 MIL/uL (ref 3.87–5.11)
RDW: 13.2 % (ref 11.5–15.5)
WBC: 5.7 K/uL (ref 4.0–10.5)
nRBC: 0 % (ref 0.0–0.2)

## 2024-01-05 LAB — LIPASE, BLOOD: Lipase: 27 U/L (ref 11–51)

## 2024-01-05 LAB — COMPREHENSIVE METABOLIC PANEL WITH GFR
ALT: 11 U/L (ref 0–44)
AST: 19 U/L (ref 15–41)
Albumin: 4.1 g/dL (ref 3.5–5.0)
Alkaline Phosphatase: 100 U/L (ref 38–126)
Anion gap: 13 (ref 5–15)
BUN: 8 mg/dL (ref 6–20)
CO2: 19 mmol/L — ABNORMAL LOW (ref 22–32)
Calcium: 9.7 mg/dL (ref 8.9–10.3)
Chloride: 107 mmol/L (ref 98–111)
Creatinine, Ser: 0.6 mg/dL (ref 0.44–1.00)
GFR, Estimated: >60 mL/min (ref >60)
Glucose, Bld: 101 mg/dL — ABNORMAL HIGH (ref 70–99)
Potassium: 3.8 mmol/L (ref 3.5–5.1)
Sodium: 139 mmol/L (ref 135–145)
Total Bilirubin: 0.4 mg/dL (ref 0.0–1.2)
Total Protein: 7.5 g/dL (ref 6.5–8.1)

## 2024-01-05 NOTE — ED Triage Notes (Signed)
 The patient reports abdominal pain x 2 days but worsened tonight. +vomiting, stated, I vomit when I'm in pain.  She reports she had abdominal surgery last week for a perforated gastric ulcer. She reports the pain is similar. She called the surgeon today who told her to come to the ER and they would meet her here.

## 2024-01-06 DIAGNOSIS — M546 Pain in thoracic spine: Secondary | ICD-10-CM | POA: Diagnosis not present

## 2024-01-06 DIAGNOSIS — R109 Unspecified abdominal pain: Secondary | ICD-10-CM | POA: Diagnosis not present

## 2024-01-06 DIAGNOSIS — N3289 Other specified disorders of bladder: Secondary | ICD-10-CM | POA: Diagnosis not present

## 2024-01-06 DIAGNOSIS — Z90710 Acquired absence of both cervix and uterus: Secondary | ICD-10-CM | POA: Diagnosis not present

## 2024-01-06 MED ORDER — IOHEXOL 350 MG/ML SOLN
75.0000 mL | Freq: Once | INTRAVENOUS | Status: AC | PRN
  Administered 2024-01-06: 75 mL via INTRAVENOUS

## 2024-01-06 NOTE — ED Notes (Addendum)
 LWBS; IV removed.

## 2024-02-09 DIAGNOSIS — M546 Pain in thoracic spine: Secondary | ICD-10-CM | POA: Diagnosis not present

## 2024-03-10 DIAGNOSIS — M546 Pain in thoracic spine: Secondary | ICD-10-CM | POA: Diagnosis not present

## 2024-04-12 DIAGNOSIS — M546 Pain in thoracic spine: Secondary | ICD-10-CM | POA: Diagnosis not present

## 2024-04-12 DIAGNOSIS — Z79891 Long term (current) use of opiate analgesic: Secondary | ICD-10-CM | POA: Diagnosis not present
